# Patient Record
Sex: Male | Born: 1980 | Race: White | Hispanic: No | Marital: Single | Smoking: Current every day smoker
Health system: Southern US, Community
[De-identification: ages and names within clinical notes are randomized; demographics above are authoritative.]

## PROBLEM LIST (undated history)

## (undated) DIAGNOSIS — M5430 Sciatica, unspecified side: Secondary | ICD-10-CM

## (undated) DIAGNOSIS — S43006A Unspecified dislocation of unspecified shoulder joint, initial encounter: Secondary | ICD-10-CM

## (undated) DIAGNOSIS — F1910 Other psychoactive substance abuse, uncomplicated: Secondary | ICD-10-CM

## (undated) HISTORY — PX: ELBOW FRACTURE SURGERY: SHX616

---
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
## 2002-02-28 ENCOUNTER — Encounter: Payer: Self-pay | Admitting: Emergency Medicine

## 2002-02-28 ENCOUNTER — Emergency Department (HOSPITAL_COMMUNITY): Admission: EM | Admit: 2002-02-28 | Discharge: 2002-02-28 | Payer: Self-pay | Admitting: Emergency Medicine

## 2004-03-30 ENCOUNTER — Emergency Department (HOSPITAL_COMMUNITY): Admission: EM | Admit: 2004-03-30 | Discharge: 2004-03-30 | Payer: Self-pay | Admitting: *Deleted

## 2004-10-07 ENCOUNTER — Emergency Department (HOSPITAL_COMMUNITY): Admission: EM | Admit: 2004-10-07 | Discharge: 2004-10-07 | Payer: Self-pay | Admitting: Emergency Medicine

## 2006-04-26 ENCOUNTER — Emergency Department (HOSPITAL_COMMUNITY): Admission: EM | Admit: 2006-04-26 | Discharge: 2006-04-27 | Payer: Self-pay | Admitting: Emergency Medicine

## 2008-06-09 ENCOUNTER — Emergency Department (HOSPITAL_COMMUNITY): Admission: EM | Admit: 2008-06-09 | Discharge: 2008-06-09 | Payer: Self-pay | Admitting: Emergency Medicine

## 2008-12-21 ENCOUNTER — Emergency Department (HOSPITAL_COMMUNITY): Admission: EM | Admit: 2008-12-21 | Discharge: 2008-12-21 | Payer: Self-pay | Admitting: Emergency Medicine

## 2010-01-16 ENCOUNTER — Emergency Department (HOSPITAL_COMMUNITY): Admission: EM | Admit: 2010-01-16 | Discharge: 2010-01-16 | Payer: Self-pay | Admitting: Emergency Medicine

## 2011-01-31 LAB — BASIC METABOLIC PANEL
BUN: 4 mg/dL — ABNORMAL LOW (ref 6–23)
CO2: 27 mEq/L (ref 19–32)
Calcium: 9 mg/dL (ref 8.4–10.5)
Chloride: 104 mEq/L (ref 96–112)
Creatinine, Ser: 0.97 mg/dL (ref 0.4–1.5)
GFR calc Af Amer: >60 mL/min (ref >60)
GFR calc non Af Amer: >60 mL/min (ref >60)
Glucose, Bld: 107 mg/dL — ABNORMAL HIGH (ref 70–99)
Potassium: 3.7 mEq/L (ref 3.5–5.1)
Sodium: 137 mEq/L (ref 135–145)

## 2011-01-31 LAB — CBC
MCHC: 35.1 g/dL (ref 30.0–36.0)
Platelets: 270 10*3/uL (ref 150–400)
RBC: 4.64 MIL/uL (ref 4.22–5.81)
RDW: 13 % (ref 11.5–15.5)

## 2011-01-31 LAB — DIFFERENTIAL
Basophils Absolute: 0 10*3/uL (ref 0.0–0.1)
Basophils Relative: 0 % (ref 0–1)
Neutro Abs: 5.4 10*3/uL (ref 1.7–7.7)
Neutrophils Relative %: 66 % (ref 43–77)

## 2011-01-31 LAB — RAPID URINE DRUG SCREEN, HOSP PERFORMED
Barbiturates: NOT DETECTED
Cocaine: POSITIVE — AB
Opiates: NOT DETECTED

## 2011-01-31 LAB — ETHANOL: Alcohol, Ethyl (B): <5 mg/dL (ref 0–10)

## 2011-03-08 NOTE — H&P (Signed)
[facility]  Patient:    Michael [name], Michael [name]                        MRN: [id number] Adm. Date:  09/19/00 Attending:  Jasmine Pang, M.D.                   Psychiatric Admission Assessment  IDENTIFICATION:  A 30 year old Caucasian male from [facility] referred secondary to attempting to harm himself.  HISTORY OF PRESENT ILLNESS: The patient tied a speaker wire around his neck in an attempt to kill himself.  He states he has a history of feeling depressed and anxious. This has worsened recently with tearfulness and escalating anxiety episodes.  He also has difficulty sleeping (partly related to his oppositional defiant disorder job hours at [employer]).  He also has had some increase in appetite recently, though he also had been losing weight several months ago without trying to.  He also complains of difficulty concentrating, anergia and anhedonia.  He had a history of suicidal ideation in the past but has been able to control this until recently.  He states he became distressed after receiving negative feedback at [facility] by his boss about his job performance.  He quit his job and then began to feel anxious about how he was going to buy a car he was in the process of trying to purchase. At that point he went to his room and tied a speaker wire around his neck and states he wanted to die.  His brother was in the home and intervened.  PAST PSYCHIATRIC HISTORY:  No treatment.  SUBSTANCE ABUSE HISTORY:  No treatment.  PAST MEDICAL HISTORY:  History of back pain.  ALLERGIES:  No known drug allergies.  CURRENT MEDICATIONS:  He states he has recently been on some medication ? name, 25 mg for his tendinitis in his right knee and his back pain.  FAMILY AND SOCIAL HISTORY:  The patient lives with his mother.  He dropped out of school in the ninth grade at the age of 30 years old.  He worked at [employer] but recently quit, after having an  altercation with his Production designer, theatre/television/film.  He denies a history of physical or sexual abuse.   His family history is positive for his mother having a history of depression and anxiety, cousins had a history of depression.  There is some alcohol abuse in relatives.  LEGAL HISTORY:  None.  ADMISSION DIAGNOSES: Axis I:    Major depression, recurrent, sever. Axis II:   Deferred. Axis III:  History of back pain. Axis IV:   Moderate. Axis V:    Global assessment of functioning of 10.  PATIENT ASSETS AND STRENGTHS:  The patient is healthy, verbal and engaging.  PROBLEMS:  Mood instability with suicidal ideation.  Short term treatment goal:  Resolution of suicidal ideation.  Long term treatment goal:  Resolution of mood instability.  ESTIMATED LENGTH OF INPATIENT TREATMENT:  Three to five days.  CONDITION NECESSARY FOR DISCHARGE:  No longer suicidal.  INITIAL PLAN OF CARE: 1. Begin Effexor XR 37.5 mg x 2 days, then 75 mg in the morning, hydroxyzine    25 mg p.o. b.i.d. and 50 mg p.o. q.h.s. for anxiety and insomnia. 2. Therapies to decrease anxiety and diminish cognitive distortions and    reduce suicidal ideation. DD:  09/19/00 TD:  09/19/00 Job: [id number]

## 2011-03-08 NOTE — Discharge Summary (Signed)
[facility]  Patient:    Michael [name], [place]                      MRN: [id number] Adm. Date:  [id number] Disc. Date: 09/22/00 Attending:  Carolanne Grumbling D                           Discharge Summary  REASON FOR ADMISSION:  The patient is a 30 year old Caucasian male from [facility] referred after he was attempting to harm himself.  He had tied a speaker wire around his neck in an attempt "to kill myself."  He was found by his cousin.  He states he has become increasingly depressed with multiple neurovegetative symptoms and positive suicidal ideation in the past.  PHYSICAL EXAMINATION:  There were no acute medical problems.  ADMISSION LABORATORIES:  A hepatic profile was within normal limits.  Thyroid panel was within normal limits. RPR negative.  The CBC with differential was done at [facility] and was within normal limits grossly.  Metabolic panel done at [facility] was within normal limits. Urine drug screen was negative.  HOSPITAL COURSE:  The patient was started on Effexor 37.5 mg q.a.m. for one day, then Effexor XR 75 mg q.a.m.  He was also begun on Vistaril 25 mg p.o. b.i.d. and 50 mg q.h.s. for anxiety.  After his physical examination he was started on Prevacid 30 mg one p.o. q.d. by Beatris Si. Adams, P.A.C., due to GERD symptoms.  Then, on September 21, 2000, Vistaril was increased to 25 mg three times a day and 100 mg at bedtime due to continued anxiety and insomnia. These medications were effective in treating his anxiety.  He was educated that the depression will resolve in three to four weeks.  He had no side effects to the medications and tolerated them well.  He also participated appropriately in unit therapeutic groups and activities to address his depressive symptoms.  He and his mother met with our social worker to discuss options at home for helping him feel better.  MENTAL STATUS EXAMINATION:  At the time of  discharge the patients eye had improved.  Psychomotor activity was within normal limits and speech was normal rate and flow.  Mood was less anxious.  Some depression but improved.  Affect: Able to reveal wide range. There was no suicidal ideation, homicidal ideation, self-injurious behavior or aggression.  There was no psychosis or perceptual disturbance.  The thought processes were logical and goal directed. Thought content:  He was excised about going home.  On cognitive examination, the patient was alert and oriented x 4.  It was grossly within normal limits. Insight was good and judgement was good.  DISCHARGE DIAGNOSES: AXIS I.   Major depression, recurrent, severe. AXIS II.  None. AXIS III. 1. History of back pain.           2. Gastroesophageal reflux disease. AXIS IV.  Moderate. AXIS V.   Global Assessment of Functioning 60/10.  POST HOSPITAL CARE PLANS: 1. The patient will return home to live with his mother. 2. Diet:  No resections. 3. Activity level:  No restrictions.  FOLLOW-UP THERAPY AND MEDICATION MANAGEMENT: This will be in the [facility] [place]  An appointment will be made to see Pamala Duffel, R.N., for therapy.  He will be seen by Jasmine Pang, M.D., for medication management.  DISCHARGE MEDICATIONS: 1.  Effexor 112.5 mg in the morning. 2. Vistaril 50 mg b.i.d. and 100 mg at bedtime. 3. Prevacid 30 mg in the morning. DD:  09/22/00 TD:  09/22/00 Job: [id number]

## 2016-01-21 ENCOUNTER — Emergency Department (HOSPITAL_COMMUNITY): Payer: Self-pay

## 2016-01-21 ENCOUNTER — Emergency Department (HOSPITAL_COMMUNITY)
Admission: EM | Admit: 2016-01-21 | Discharge: 2016-01-21 | Disposition: A | Discharge status: Home / Self Care | Payer: Self-pay | Attending: Emergency Medicine | Admitting: Emergency Medicine

## 2016-01-21 ENCOUNTER — Encounter (HOSPITAL_COMMUNITY): Payer: Self-pay | Admitting: Emergency Medicine

## 2016-01-21 DIAGNOSIS — J069 Acute upper respiratory infection, unspecified: Secondary | ICD-10-CM | POA: Insufficient documentation

## 2016-01-21 DIAGNOSIS — Z79899 Other long term (current) drug therapy: Secondary | ICD-10-CM | POA: Insufficient documentation

## 2016-01-21 DIAGNOSIS — Z7982 Long term (current) use of aspirin: Secondary | ICD-10-CM | POA: Insufficient documentation

## 2016-01-21 DIAGNOSIS — F17210 Nicotine dependence, cigarettes, uncomplicated: Secondary | ICD-10-CM | POA: Insufficient documentation

## 2016-01-21 DIAGNOSIS — M5431 Sciatica, right side: Secondary | ICD-10-CM

## 2016-01-21 DIAGNOSIS — M5441 Lumbago with sciatica, right side: Secondary | ICD-10-CM | POA: Insufficient documentation

## 2016-01-21 LAB — URINALYSIS, ROUTINE W REFLEX MICROSCOPIC
Bilirubin Urine: NEGATIVE
Glucose, UA: NEGATIVE mg/dL
Hgb urine dipstick: NEGATIVE
Ketones, ur: NEGATIVE mg/dL
Leukocytes, UA: NEGATIVE
NITRITE: NEGATIVE
PROTEIN: NEGATIVE mg/dL
SPECIFIC GRAVITY, URINE: 1.015 (ref 1.005–1.030)
pH: 6 (ref 5.0–8.0)

## 2016-01-21 MED ORDER — HYDROCODONE-ACETAMINOPHEN 5-325 MG PO TABS: ORAL_TABLET | ORAL | Status: DC

## 2016-01-21 MED ORDER — CYCLOBENZAPRINE HCL 10 MG PO TABS
10.0000 mg | ORAL_TABLET | Freq: Once | ORAL | Status: AC
  Administered 2016-01-21: 10 mg via ORAL
  Filled 2016-01-21: qty 1

## 2016-01-21 MED ORDER — CYCLOBENZAPRINE HCL 10 MG PO TABS: 10.0000 mg | ORAL_TABLET | Freq: Three times a day (TID) | ORAL | Status: DC | PRN

## 2016-01-21 MED ORDER — IBUPROFEN 800 MG PO TABS: 800.0000 mg | ORAL_TABLET | Freq: Three times a day (TID) | ORAL | Status: DC

## 2016-01-21 MED ORDER — HYDROCODONE-ACETAMINOPHEN 5-325 MG PO TABS
1.0000 | ORAL_TABLET | Freq: Once | ORAL | Status: AC
  Administered 2016-01-21: 1 via ORAL
  Filled 2016-01-21: qty 1

## 2016-01-21 NOTE — ED Notes (Signed)
Pt complains of right lower back pain radiating down leg x 3 weeks. Pt also complains of cough. No SOB.

## 2016-01-21 NOTE — Discharge Instructions (Signed)
°  Sciatica °Sciatica is pain, weakness, numbness, or tingling along your sciatic nerve. The nerve starts in the lower back and runs down the back of each leg. Nerve damage or certain conditions pinch or put pressure on the sciatic nerve. This causes the pain, weakness, and other discomforts of sciatica. °HOME CARE  °· Only take medicine as told by your doctor. °· Apply ice to the affected area for 20 minutes. Do this 3-4 times a day for the first 48-72 hours. Then try heat in the same way. °· Exercise, stretch, or do your usual activities if these do not make your pain worse. °· Go to physical therapy as told by your doctor. °· Keep all doctor visits as told. °· Do not wear high heels or shoes that are not supportive. °· Get a firm mattress if your mattress is too soft to lessen pain and discomfort. °GET HELP RIGHT AWAY IF:  °· You cannot control when you poop (bowel movement) or pee (urinate). °· You have more weakness in your lower back, lower belly (pelvis), butt (buttocks), or legs. °· You have redness or puffiness (swelling) of your back. °· You have a burning feeling when you pee. °· You have pain that gets worse when you lie down. °· You have pain that wakes you from your sleep. °· Your pain is worse than past pain. °· Your pain lasts longer than 4 weeks. °· You are suddenly losing weight without reason. °MAKE SURE YOU:  °· Understand these instructions. °· Will watch this condition. °· Will get help right away if you are not doing well or get worse. °  °This information is not intended to replace advice given to you by your health care provider. Make sure you discuss any questions you have with your health care provider. °  °Document Released: 07/16/2008 Document Revised: 06/28/2015 Document Reviewed: 02/16/2012 °Elsevier Interactive Patient Education ©2016 Elsevier Inc. ° ° °

## 2016-01-22 NOTE — ED Provider Notes (Signed)
CSN: [id number]     Arrival date & time 01/21/16  1017 History   First MD Initiated Contact with Patient 01/21/16 1037     Chief Complaint  Patient presents with  . Back Pain     (Consider location/radiation/quality/duration/timing/severity/associated sxs/prior Treatment) HPI   Michael [name] is a 35 y.o. male who presents to the Emergency Department complaining of right sided low back pain for 3 weeks.  He describes a sharp stabbing pain in his right low back that radiates down the right leg to level of his knee.  Pain is worse with weight bearing and bending.  He denies numbness or weakness of the extremities, fever, chills, abd pain, urine or bowel changes.  He also complains of cough and nasal congestion for several days.  Cough is non-productive.  Denies shortness of breath, chest pain and sore throat.     History reviewed. No pertinent past medical history. History reviewed. No pertinent past surgical history. History reviewed. No pertinent family history. Social History  Substance Use Topics  . Smoking status: Current Every Day Smoker -- 0.50 packs/day    Types: Cigarettes  . Smokeless tobacco: None  . Alcohol Use: No    Review of Systems  Constitutional: Negative for fever, activity change and appetite change.  HENT: Positive for congestion. Negative for sore throat.   Respiratory: Positive for cough. Negative for shortness of breath.   Cardiovascular: Negative for chest pain.  Gastrointestinal: Negative for vomiting, abdominal pain and constipation.  Genitourinary: Negative for dysuria, hematuria, flank pain, decreased urine volume and difficulty urinating.  Musculoskeletal: Positive for back pain. Negative for joint swelling.  Skin: Negative for rash.  Neurological: Negative for dizziness, weakness and numbness.  All other systems reviewed and are negative.     Allergies  Review of patient's allergies indicates no known allergies.  Home Medications   Prior to  Admission medications   Medication Sig Start Date End Date Taking? Authorizing Provider  Aspirin-Caffeine (BAYER BACK & BODY) 500-32.5 MG TABS Take 1 tablet by mouth daily.   Yes Historical Provider, MD  Pseudoephedrine-Ibuprofen (IBUPROFEN AND PSE COLD & SINUS) 30-200 MG TABS Take 2 tablets by mouth daily.   Yes Historical Provider, MD  cyclobenzaprine (FLEXERIL) 10 MG tablet Take 1 tablet (10 mg total) by mouth 3 (three) times daily as needed. 01/21/16   Waldo Damian, PA-C  HYDROcodone-acetaminophen (NORCO/VICODIN) 5-325 MG tablet Take one-two tabs po q 4-6 hrs prn pain 01/21/16   Jefrey Raburn, PA-C  ibuprofen (ADVIL,MOTRIN) 800 MG tablet Take 1 tablet (800 mg total) by mouth 3 (three) times daily. 01/21/16   Zaley Talley, PA-C   BP 136/87 mmHg  Pulse 78  Temp(Src) 98.3 F (36.8 C) (Oral)  Resp 16  Ht  (1.676 m)  Wt 95.255 kg  BMI 33.91 kg/m2  SpO2 100% Physical Exam  Constitutional: He is oriented to person, place, and time. He appears well-developed and well-nourished. No distress.  HENT:  Head: Normocephalic and atraumatic.  Mouth/Throat: Oropharynx is clear and moist.  Neck: Normal range of motion. Neck supple.  Cardiovascular: Normal rate, regular rhythm and intact distal pulses.   No murmur heard. Pulmonary/Chest: Effort normal and breath sounds normal. No respiratory distress. He exhibits no tenderness.  Abdominal: Soft. He exhibits no distension. There is no tenderness.  Musculoskeletal: He exhibits tenderness. He exhibits no edema.       Lumbar back: He exhibits tenderness and pain. He exhibits normal range of motion, no swelling, no deformity, no  laceration and normal pulse.  ttp of the right lumbar paraspinal muscles and SI joint.  No spinal tenderness.  DP pulses are brisk and symmetrical.  Distal sensation intact.  Pt has 5/5 strength against resistance of bilateral lower extremities.     Neurological: He is alert and oriented to person, place, and time. He has  normal strength. No sensory deficit. He exhibits normal muscle tone. Coordination and gait normal.  Reflex Scores:      Patellar reflexes are 2+ on the right side and 2+ on the left side.      Achilles reflexes are 2+ on the right side and 2+ on the left side. Skin: Skin is warm and dry. No rash noted.  Nursing note and vitals reviewed.   ED Course  Procedures (including critical care time) Labs Review Labs Reviewed  URINALYSIS, ROUTINE W REFLEX MICROSCOPIC ([facility])    Imaging Review Dg Lumbar Spine Complete  01/21/2016  CLINICAL DATA:  Right lower back and lower extremity pain for 3 weeks without known injury. EXAM: LUMBAR SPINE - COMPLETE 4+ VIEW COMPARISON:  None. FINDINGS: There is no evidence of lumbar spine fracture. Alignment is normal. Intervertebral disc spaces are maintained. IMPRESSION: Normal lumbar spine. Electronically Signed   By: Lupita RaiderJames  Green Jr, M.D.   On: 01/21/2016 11:30   I have personally reviewed and evaluated these images and lab results as part of my medical decision-making.   EKG Interpretation None      MDM   Final diagnoses:  Sciatica of right side  URI (upper respiratory infection)    Pt well appearing. Non-toxic.  No focal neuro deficits.  No concerning sx's for emergent neurological process.  Likely sciatica.  Agrees to close PMD.  Appears stable for d/c.  Ambulates with steady gait.      Pauline Ausammy Kalep Full, PA-C 01/22/16 2217  Benjiman CoreNathan Pickering, MD 01/23/16 [phone]

## 2016-05-24 ENCOUNTER — Encounter (HOSPITAL_COMMUNITY): Payer: Self-pay | Admitting: *Deleted

## 2016-05-24 ENCOUNTER — Emergency Department (HOSPITAL_COMMUNITY)
Admission: EM | Admit: 2016-05-24 | Discharge: 2016-05-25 | Disposition: A | Discharge status: Home / Self Care | Payer: Self-pay | Attending: Emergency Medicine | Admitting: Emergency Medicine

## 2016-05-24 DIAGNOSIS — M5441 Lumbago with sciatica, right side: Secondary | ICD-10-CM | POA: Insufficient documentation

## 2016-05-24 DIAGNOSIS — F17210 Nicotine dependence, cigarettes, uncomplicated: Secondary | ICD-10-CM | POA: Insufficient documentation

## 2016-05-24 DIAGNOSIS — Z7982 Long term (current) use of aspirin: Secondary | ICD-10-CM | POA: Insufficient documentation

## 2016-05-24 HISTORY — DX: Sciatica, unspecified side: M54.30

## 2016-05-24 MED ORDER — DEXAMETHASONE SODIUM PHOSPHATE 10 MG/ML IJ SOLN
10.0000 mg | Freq: Once | INTRAMUSCULAR | Status: AC
  Administered 2016-05-24: 10 mg via INTRAMUSCULAR
  Filled 2016-05-24: qty 1

## 2016-05-24 MED ORDER — KETOROLAC TROMETHAMINE 60 MG/2ML IM SOLN
60.0000 mg | Freq: Once | INTRAMUSCULAR | Status: AC
  Administered 2016-05-24: 60 mg via INTRAMUSCULAR
  Filled 2016-05-24: qty 2

## 2016-05-24 NOTE — ED Provider Notes (Signed)
[facility]-EMERGENCY DEPT Provider Note   CSN: [id number] Arrival date & time: 05/24/16  2037  First Provider Contact:  First MD Initiated Contact with Patient 05/24/16 2228      By signing my name below, I, Emmanuella Mensah, attest that this documentation has been prepared under the direction and in the presence of Cheri Fowler, PA-C. Electronically Signed: Angelene Giovanni, ED Scribe. 05/24/16. 10:52 PM.    History   Chief Complaint Chief Complaint  Patient presents with  . Back Pain    HPI Comments: Michael [name] is a 35 y.o. male with a hx of sciatica who presents to the Emergency Department complaining of ongoing 6/10 right lower back pain that radiates down his posterior right leg onset 01/2016. No alleviating factors noted. He states that he went to [facility] where he was diagnosed with sciatica and given NSAIDs after onset of the pain but he reports no relief with the NSAIDs. He denies any recent falls, injuries, or trauma to the back. He denies any IV drug use. He also denies any fever, chills, numbness, weakness, bowel/bladder incontinence, or urinary symptoms.   The history is provided by the patient. No language interpreter was used.    Past Medical History:  Diagnosis Date  . Sciatica     There are no active problems to display for this patient.   History reviewed. No pertinent surgical history.     Home Medications    Prior to Admission medications   Medication Sig Start Date End Date Taking? Authorizing Provider  Aspirin-Caffeine (BAYER BACK & BODY) 500-32.5 MG TABS Take 1 tablet by mouth daily.    Historical Provider, MD  cyclobenzaprine (FLEXERIL) 10 MG tablet Take 1 tablet (10 mg total) by mouth 3 (three) times daily as needed. 01/21/16   Tammy Triplett, PA-C  HYDROcodone-acetaminophen (NORCO/VICODIN) 5-325 MG tablet Take one-two tabs po q 4-6 hrs prn pain 01/21/16   Tammy Triplett, PA-C  ibuprofen (ADVIL,MOTRIN) 800 MG tablet Take 1 tablet (800 mg total) by  mouth 3 (three) times daily. 01/21/16   Tammy Triplett, PA-C  methylPREDNISolone (MEDROL DOSEPAK) 4 MG TBPK tablet Take as instructed on package. 05/25/16   Cheri Fowler, PA-C  naproxen (NAPROSYN) 500 MG tablet Take 1 tablet (500 mg total) by mouth 2 (two) times daily. 05/25/16   Cheri Fowler, PA-C  oxyCODONE-acetaminophen (PERCOCET/ROXICET) 5-325 MG tablet Take 1 tablet by mouth every 6 (six) hours as needed for severe pain. 05/25/16   Cheri Fowler, PA-C  Pseudoephedrine-Ibuprofen (IBUPROFEN AND PSE COLD & SINUS) 30-200 MG TABS Take 2 tablets by mouth daily.    Historical Provider, MD    Family History History reviewed. No pertinent family history.  Social History Social History  Substance Use Topics  . Smoking status: Current Every Day Smoker    Packs/day: 0.50    Types: Cigarettes  . Smokeless tobacco: Never Used  . Alcohol use No     Allergies   Review of patient's allergies indicates no known allergies.   Review of Systems Review of Systems  Constitutional: Negative for chills and fever.  Genitourinary: Negative for dysuria and hematuria.  Musculoskeletal: Positive for back pain.  Neurological: Negative for weakness and numbness.     Physical Exam Updated Vital Signs BP 134/92 (BP Location: Left Arm)   Pulse 72   Temp 98.3 F (36.8 C) (Oral)   Resp 20   Ht 5\' 6"  (1.676 m)   Wt 87.5 kg   SpO2 98%   BMI 31.15 kg/m  Physical Exam  Constitutional: He is oriented to person, place, and time. He appears well-developed and well-nourished.  HENT:  Head: Normocephalic and atraumatic.  Right Ear: External ear normal.  Left Ear: External ear normal.  Eyes: Conjunctivae are normal. No scleral icterus.  Neck: No tracheal deviation present.  Cardiovascular: Normal rate, regular rhythm, normal heart sounds and intact distal pulses.   Pulses:      Dorsalis pedis pulses are 2+ on the right side, and 2+ on the left side.  Pulmonary/Chest: Effort normal and breath sounds normal. No  respiratory distress.  Abdominal: Soft. Bowel sounds are normal. He exhibits no distension. There is no tenderness.  Musculoskeletal: Normal range of motion. He exhibits no tenderness.  No spinous process tenderness.  No step offs. No crepitus.  Neurological: He is alert and oriented to person, place, and time.  No saddle anesthesia. Strength and sensation intact bilaterally throughout lower extremities. Normal gait. No foot drop.  Skin: Skin is warm and dry.  Psychiatric: He has a normal mood and affect. His behavior is normal.     ED Treatments / Results  DIAGNOSTIC STUDIES: Oxygen Saturation is 98% on RA, normal by my interpretation.    COORDINATION OF CARE: 10:51 PM- Pt advised of plan for treatment and pt agrees. Will provide resources for [facility] for PCP establishment for possible MRI. He will receive prednisone, NSAIDs, and pain medication.   Procedures Procedures (including critical care time)  Medications Ordered in ED Medications  dexamethasone (DECADRON) injection 10 mg (10 mg Intramuscular Given 05/24/16 2311)  ketorolac (TORADOL) injection 60 mg (60 mg Intramuscular Given 05/24/16 2311)     Initial Impression / Assessment and Plan / ED Course  Cheri Fowler, PA-C has reviewed the triage vital signs and the nursing notes.  Pertinent labs & imaging results that were available during my care of the patient were reviewed by me and considered in my medical decision making (see chart for details).  Clinical Course   Patient presents with low back pain.  VSS.  No injury/trauma.  No red flags.  No neurological deficits.  Distal pulses intact.  No gait abnormalities.  Suspect low back strain or other mechanical cause.  Doubt cauda equina.  Doubt infectious process.  Doubt AAA.  Discussed return precautions to the ED.  Follow up with PCP.   Final Clinical Impressions(s) / ED Diagnoses   Final diagnoses:  Right-sided low back pain with right-sided sciatica   I  personally performed the services described in this documentation, which was scribed in my presence. The recorded information has been reviewed and is accurate.   New Prescriptions New Prescriptions   METHYLPREDNISOLONE (MEDROL DOSEPAK) 4 MG TBPK TABLET    Take as instructed on package.   NAPROXEN (NAPROSYN) 500 MG TABLET    Take 1 tablet (500 mg total) by mouth 2 (two) times daily.   OXYCODONE-ACETAMINOPHEN (PERCOCET/ROXICET) 5-325 MG TABLET    Take 1 tablet by mouth every 6 (six) hours as needed for severe pain.     Cheri Fowler, PA-C 05/25/16 0015    Mancel Bale, MD 05/25/16 [phone]

## 2016-05-24 NOTE — ED Triage Notes (Signed)
Pt c/o right lower back pain shooting down right leg. Pt states he was dx with sciatica at [facility] about a month ago, given anti inflammatories but had zero relief from medication.

## 2016-05-25 MED ORDER — OXYCODONE-ACETAMINOPHEN 5-325 MG PO TABS: 1.0000 | ORAL_TABLET | Freq: Four times a day (QID) | ORAL | 0 refills | Status: DC | PRN

## 2016-05-25 MED ORDER — NAPROXEN 500 MG PO TABS: 500.0000 mg | ORAL_TABLET | Freq: Two times a day (BID) | ORAL | 0 refills | Status: DC

## 2016-05-25 MED ORDER — METHYLPREDNISOLONE 4 MG PO TBPK: ORAL_TABLET | ORAL | 0 refills | Status: DC

## 2016-05-25 NOTE — ED Notes (Signed)
Patient able to ambulate independently  

## 2016-05-31 ENCOUNTER — Encounter: Payer: Self-pay | Admitting: Physician Assistant

## 2016-05-31 ENCOUNTER — Other Ambulatory Visit: Payer: Self-pay | Admitting: Physician Assistant

## 2016-05-31 ENCOUNTER — Ambulatory Visit: Payer: Self-pay | Attending: Internal Medicine | Admitting: Physician Assistant

## 2016-05-31 VITALS — BP 121/88 | HR 95 | Temp 98.3°F | Resp 16 | Wt 191.6 lb

## 2016-05-31 DIAGNOSIS — Z131 Encounter for screening for diabetes mellitus: Secondary | ICD-10-CM

## 2016-05-31 DIAGNOSIS — M5416 Radiculopathy, lumbar region: Secondary | ICD-10-CM

## 2016-05-31 DIAGNOSIS — IMO0001 Reserved for inherently not codable concepts without codable children: Secondary | ICD-10-CM

## 2016-05-31 LAB — GLUCOSE, POCT (MANUAL RESULT ENTRY): POC Glucose: 93 mg/dl (ref 70–99)

## 2016-05-31 MED ORDER — CYCLOBENZAPRINE HCL 10 MG PO TABS: 10.0000 mg | ORAL_TABLET | Freq: Three times a day (TID) | ORAL | 0 refills | Status: DC | PRN

## 2016-05-31 MED ORDER — PREDNISONE 20 MG PO TABS: ORAL_TABLET | ORAL | 0 refills | Status: DC

## 2016-05-31 MED ORDER — ACETAMINOPHEN-CODEINE #3 300-30 MG PO TABS: 1.0000 | ORAL_TABLET | ORAL | 0 refills | Status: DC | PRN

## 2016-05-31 NOTE — Progress Notes (Signed)
Michael [name], is a 35 y.o. male  ZOX:[id number]  [id number]:[id number]  DOB - 02-Mar-1981  Subjective:  Chief Complaint and HPI: Michael [name] is a 35 y.o. male here today to establish care and for a follow up visit after being seen in the ED in April and again in August for R sided LBP with R leg pain. NKI.  No precipitating event.  He jsut started having pain in his R lower back in April and it has progressively worsened.  He was given prednisone and hydrocodone in April and Prednisone and Oxycodone in August. He says the pain med provides temporarily relief and the first few days of steroid seems to help.  He denies numbness, weakness, and paresthesias.  He admits to pain all the way down his R leg.  PMH unremarkable.   ED/Hospital notes reviewed.    ROS:   Constitutional:  No f/c, No night sweats, No unexplained weight loss. EENT:  No vision changes, No blurry vision, No hearing changes. No mouth, throat, or ear problems.  Respiratory: No cough, No SOB Cardiac: No CP, no palpitations GI:  No abd pain, No N/V/D. GU: No Urinary s/sx Musculoskeletal: +back and R leg pain Neuro: No headache, no dizziness, no motor weakness.  Skin: No rash Endocrine:  No polydipsia. No polyuria.  Psych: Denies SI/HI  No problems updated.  ALLERGIES: No Known Allergies  PAST MEDICAL HISTORY: Past Medical History:  Diagnosis Date  . Sciatica     MEDICATIONS AT HOME: Prior to Admission medications   Medication Sig Start Date End Date Taking? Authorizing Provider  acetaminophen-codeine (TYLENOL #3) 300-30 MG tablet Take 1 tablet by mouth every 4 (four) hours as needed for moderate pain. 05/31/16   Anders Simmonds, PA-C  Aspirin-Caffeine (BAYER BACK & BODY) 500-32.5 MG TABS Take 1 tablet by mouth daily.    Historical Provider, MD  cyclobenzaprine (FLEXERIL) 10 MG tablet Take 1 tablet (10 mg total) by mouth 3 (three) times daily as needed. 05/31/16   Anders Simmonds, PA-C  ibuprofen (ADVIL,MOTRIN) 800  MG tablet Take 1 tablet (800 mg total) by mouth 3 (three) times daily. 01/21/16   Tammy Triplett, PA-C  naproxen (NAPROSYN) 500 MG tablet Take 1 tablet (500 mg total) by mouth 2 (two) times daily. 05/25/16   Cheri Fowler, PA-C  predniSONE (DELTASONE) 20 MG tablet 3,3,3,2,2,2,1,1,1 Take each days dose in am with food 05/31/16   Anders Simmonds, PA-C  Pseudoephedrine-Ibuprofen (IBUPROFEN AND PSE COLD & SINUS) 30-200 MG TABS Take 2 tablets by mouth daily.    Historical Provider, MD     Objective:  EXAM:   Vitals:   05/31/16 1500  BP: 121/88  Pulse: 95  Resp: 16  Temp: 98.3 F (36.8 C)  TempSrc: Oral  SpO2: 97%  Weight: 191 lb 9.6 oz (86.9 kg)    General appearance : A&OX3. NAD. Non-toxic-appearing HEENT: Atraumatic and Normocephalic.  PERRLA. EOM intact.  TM clear B. Mouth-MMM, post pharynx WNL w/o erythema, No PND. Neck: supple, no JVD. No cervical lymphadenopathy. No thyromegaly Chest/Lungs:  Breathing-non-labored, Good air entry bilaterally, breath sounds normal without rales, rhonchi, or wheezing  CVS: S1 S2 regular, no murmurs, gallops, rubs  Abdomen: Bowel sounds present, Non tender and not distended with no gaurding, rigidity or rebound. Extremities: Bilateral Lower Ext shows no edema, both legs are warm to touch with = pulse throughout.  +symmetry throughout.  S&ROM =B.  DTR=B.  +SLR R&L.   Back:  No TTP over thoracic or  lumbar processes.  TTP in R S-I joint.  General TTP of surrounding musculature.  Neurology:  CN II-XII grossly intact, Non focal.   Psych:  TP linear. J/I WNL. Normal speech. Appropriate eye contact and affect.  Skin:  No Rash  Data Review No results found for: HGBA1C   Assessment & Plan   1. Radicular pain of right lower back - cyclobenzaprine (FLEXERIL) 10 MG tablet; Take 1 tablet (10 mg total) by mouth 3 (three) times daily as needed.  Dispense: 21 tablet; Refill: 0 - MR Lumbar Spine Wo Contrast; Future - acetaminophen-codeine (TYLENOL #3) 300-30 MG  tablet; Take 1 tablet by mouth every 4 (four) hours as needed for moderate pain.  Dispense: 20 tablet; Refill: 0 - predniSONE (DELTASONE) 20 MG tablet; 3,3,3,2,2,2,1,1,1 Take each days dose in am with food  Dispense: 18 tablet; Refill: 0 [place] database drug query performed and only showed the hydrocodone and oxycodone prescribed in April and August respectively.  I did discuss risks of chemical dependency/dangers of opiate use in general and how it should be limited or avoided when at all possible.   2. Screening for diabetes mellitus Check sugar due to recent steroids - Glucose (CBG)   Patient have been counseled extensively about nutrition and exercise  Return in about 3 weeks (around 06/21/2016) for establish with a PCP here and f/up back pain.  The patient was given clear instructions to go to ER or return to medical center if symptoms don't improve, worsen or new problems develop. The patient verbalized understanding. The patient was told to call to get lab results if they haven't heard anything in the next week.     Georgian CoAngela Tonga Prout, PA-C [facility] [place], [place] [phone]   05/31/2016, 3:43 PM

## 2016-05-31 NOTE — Patient Instructions (Addendum)
4:45 pm Thursday, August 17th at [facility] for MRI of the lower back Arrive 10-15 mins early        Back Pain, Adult Back pain is very common in adults.The cause of back pain is rarely dangerous and the pain often gets better over time.The cause of your back pain may not be known. Some common causes of back pain include:  Strain of the muscles or ligaments supporting the spine.  Wear and tear (degeneration) of the spinal disks.  Arthritis.  Direct injury to the back. For many people, back pain may return. Since back pain is rarely dangerous, most people can learn to manage this condition on their own. HOME CARE INSTRUCTIONS Watch your back pain for any changes. The following actions may help to lessen any discomfort you are feeling:  Remain active. It is stressful on your back to sit or stand in one place for long periods of time. Do not sit, drive, or stand in one place for more than 30 minutes at a time. Take short walks on even surfaces as soon as you are able.Try to increase the length of time you walk each day.  Exercise regularly as directed by your health care provider. Exercise helps your back heal faster. It also helps avoid future injury by keeping your muscles strong and flexible.  Do not stay in bed.Resting more than 1-2 days can delay your recovery.  Pay attention to your body when you bend and lift. The most comfortable positions are those that put less stress on your recovering back. Always use proper lifting techniques, including:  Bending your knees.  Keeping the load close to your body.  Avoiding twisting.  Find a comfortable position to sleep. Use a firm mattress and lie on your side with your knees slightly bent. If you lie on your back, put a pillow under your knees.  Avoid feeling anxious or stressed.Stress increases muscle tension and can worsen back pain.It is important to recognize when you are anxious or stressed and learn ways to  manage it, such as with exercise.  Take medicines only as directed by your health care provider. Over-the-counter medicines to reduce pain and inflammation are often the most helpful.Your health care provider may prescribe muscle relaxant drugs.These medicines help dull your pain so you can more quickly return to your normal activities and healthy exercise.  Apply ice to the injured area:  Put ice in a plastic bag.  Place a towel between your skin and the bag.  Leave the ice on for 20 minutes, 2-3 times a day for the first 2-3 days. After that, ice and heat may be alternated to reduce pain and spasms.  Maintain a healthy weight. Excess weight puts extra stress on your back and makes it difficult to maintain good posture. SEEK MEDICAL CARE IF:  You have pain that is not relieved with rest or medicine.  You have increasing pain going down into the legs or buttocks.  You have pain that does not improve in one week.  You have night pain.  You lose weight.  You have a fever or chills. SEEK IMMEDIATE MEDICAL CARE IF:   You develop new bowel or bladder control problems.  You have unusual weakness or numbness in your arms or legs.  You develop nausea or vomiting.  You develop abdominal pain.  You feel faint.   This information is not intended to replace advice given to you by your health care provider. Make sure you discuss  any questions you have with your health care provider.   Document Released: 10/07/2005 Document Revised: 10/28/2014 Document Reviewed: 02/08/2014 Elsevier Interactive Patient Education [employer].

## 2016-06-06 ENCOUNTER — Ambulatory Visit (HOSPITAL_COMMUNITY)
Admission: RE | Admit: 2016-06-06 | Discharge: 2016-06-06 | Disposition: A | Discharge status: Home / Self Care | Payer: Self-pay | Source: Ambulatory Visit | Attending: Physician Assistant | Admitting: Physician Assistant

## 2016-06-06 DIAGNOSIS — IMO0001 Reserved for inherently not codable concepts without codable children: Secondary | ICD-10-CM

## 2016-06-06 DIAGNOSIS — M5126 Other intervertebral disc displacement, lumbar region: Secondary | ICD-10-CM | POA: Insufficient documentation

## 2016-06-06 DIAGNOSIS — M5416 Radiculopathy, lumbar region: Secondary | ICD-10-CM | POA: Insufficient documentation

## 2016-06-06 DIAGNOSIS — M4806 Spinal stenosis, lumbar region: Secondary | ICD-10-CM | POA: Insufficient documentation

## 2016-06-10 ENCOUNTER — Emergency Department (HOSPITAL_COMMUNITY)
Admission: EM | Admit: 2016-06-10 | Discharge: 2016-06-10 | Disposition: A | Discharge status: Home / Self Care | Payer: Self-pay | Attending: Emergency Medicine | Admitting: Emergency Medicine

## 2016-06-10 ENCOUNTER — Other Ambulatory Visit: Payer: Self-pay | Admitting: Internal Medicine

## 2016-06-10 ENCOUNTER — Encounter (HOSPITAL_COMMUNITY): Payer: Self-pay | Admitting: Emergency Medicine

## 2016-06-10 DIAGNOSIS — M5441 Lumbago with sciatica, right side: Secondary | ICD-10-CM | POA: Insufficient documentation

## 2016-06-10 DIAGNOSIS — Z7982 Long term (current) use of aspirin: Secondary | ICD-10-CM | POA: Insufficient documentation

## 2016-06-10 DIAGNOSIS — Z791 Long term (current) use of non-steroidal anti-inflammatories (NSAID): Secondary | ICD-10-CM | POA: Insufficient documentation

## 2016-06-10 DIAGNOSIS — Z79899 Other long term (current) drug therapy: Secondary | ICD-10-CM | POA: Insufficient documentation

## 2016-06-10 DIAGNOSIS — M5431 Sciatica, right side: Secondary | ICD-10-CM

## 2016-06-10 DIAGNOSIS — F17210 Nicotine dependence, cigarettes, uncomplicated: Secondary | ICD-10-CM | POA: Insufficient documentation

## 2016-06-10 MED ORDER — KETOROLAC TROMETHAMINE 10 MG PO TABS
10.0000 mg | ORAL_TABLET | Freq: Once | ORAL | Status: AC
  Administered 2016-06-10: 10 mg via ORAL
  Filled 2016-06-10: qty 1

## 2016-06-10 MED ORDER — DIAZEPAM 5 MG PO TABS
10.0000 mg | ORAL_TABLET | Freq: Once | ORAL | Status: AC
  Administered 2016-06-10: 10 mg via ORAL
  Filled 2016-06-10: qty 2

## 2016-06-10 MED ORDER — DICLOFENAC SODIUM 75 MG PO TBEC: 75.0000 mg | DELAYED_RELEASE_TABLET | Freq: Two times a day (BID) | ORAL | 0 refills | Status: DC

## 2016-06-10 MED ORDER — DEXAMETHASONE SODIUM PHOSPHATE 4 MG/ML IJ SOLN
8.0000 mg | Freq: Once | INTRAMUSCULAR | Status: AC
Start: 2016-06-10 — End: 2016-06-10
  Administered 2016-06-10: 8 mg via INTRAMUSCULAR
  Filled 2016-06-10: qty 2

## 2016-06-10 MED ORDER — CYCLOBENZAPRINE HCL 10 MG PO TABS: 10.0000 mg | ORAL_TABLET | Freq: Three times a day (TID) | ORAL | 0 refills | Status: DC

## 2016-06-10 MED ORDER — DEXAMETHASONE 4 MG PO TABS: 4.0000 mg | ORAL_TABLET | Freq: Two times a day (BID) | ORAL | 0 refills | Status: DC

## 2016-06-10 NOTE — ED Provider Notes (Signed)
[facility]-EMER[facility]Y DEPT Provider Note   CSN: [id number] Arrival date & time: 06/10/16  1603     History   Chief Complaint Chief Complaint  Patient presents with  . Back Pain    HPI Michael [name] is a 35 y.o. male.  The history is provided by the patient.  Back Pain   This is a chronic problem. The current episode started more than 1 week ago. The problem occurs daily. The pain is associated with no known injury. The pain is present in the lumbar spine. The quality of the pain is described as shooting. The symptoms are aggravated by certain positions. The pain is the same all the time. Pertinent negatives include no chest pain, no fever, no numbness, no abdominal pain, no bladder incontinence, no paresthesias, no tingling and no weakness. He has tried NSAIDs for the symptoms.    Past Medical History:  Diagnosis Date  . Sciatica     There are no active problems to display for this patient.   History reviewed. No pertinent surgical history.     Home Medications    Prior to Admission medications   Medication Sig Start Date End Date Taking? Authorizing Provider  acetaminophen-codeine (TYLENOL #3) 300-30 MG tablet Take 1 tablet by mouth every 4 (four) hours as needed for moderate pain. 05/31/16   Anders SimmondsAngela M McClung, PA-C  Aspirin-Caffeine (BAYER BACK & BODY) 500-32.5 MG TABS Take 1 tablet by mouth daily.    Historical Provider, MD  cyclobenzaprine (FLEXERIL) 10 MG tablet Take 1 tablet (10 mg total) by mouth 3 (three) times daily as needed. 05/31/16   Anders SimmondsAngela M McClung, PA-C  ibuprofen (ADVIL,MOTRIN) 800 MG tablet Take 1 tablet (800 mg total) by mouth 3 (three) times daily. 01/21/16   Tammy Triplett, PA-C  naproxen (NAPROSYN) 500 MG tablet Take 1 tablet (500 mg total) by mouth 2 (two) times daily. 05/25/16   Cheri FowlerKayla Rose, PA-C  predniSONE (DELTASONE) 20 MG tablet 3,3,3,2,2,2,1,1,1 Take each days dose in am with food 05/31/16   Anders SimmondsAngela M McClung, PA-C  Pseudoephedrine-Ibuprofen (IBUPROFEN AND  PSE COLD & SINUS) 30-200 MG TABS Take 2 tablets by mouth daily.    Historical Provider, MD    Family History History reviewed. No pertinent family history.  Social History Social History  Substance Use Topics  . Smoking status: Current Every Day Smoker    Packs/day: 0.50    Types: Cigarettes  . Smokeless tobacco: Never Used  . Alcohol use Yes     Comment: "rarely"     Allergies   Review of patient's allergies indicates no known allergies.   Review of Systems Review of Systems  Constitutional: Negative for fever.  Cardiovascular: Negative for chest pain.  Gastrointestinal: Negative for abdominal pain.  Genitourinary: Negative for bladder incontinence.  Musculoskeletal: Positive for back pain.  Neurological: Negative for tingling, weakness, numbness and paresthesias.     Physical Exam Updated Vital Signs BP 120/82 (BP Location: Left Arm)   Pulse 92   Temp 98.8 F (37.1 C) (Oral)   Resp 18   Ht 5\' 6"  (1.676 m)   Wt 87.5 kg   SpO2 99%   BMI 31.15 kg/m   Physical Exam  Musculoskeletal:       Lumbar back: He exhibits pain and spasm.     ED Treatments / Results  Labs (all labs ordered are listed, but only abnormal results are displayed) Labs Reviewed - No data to display  EKG  EKG Interpretation None  Radiology No results found.  Procedures Procedures (including critical care time)  Medications Ordered in ED Medications - No data to display   Initial Impression / Assessment and Plan / ED Course  I have reviewed the triage vital signs and the nursing notes.  Pertinent labs & imaging results that were available during my care of the patient were reviewed by me and considered in my medical decision making (see chart for details).  Clinical Course    **I have reviewed nursing notes, vital signs, and all appropriate lab and imaging results for this patient.*  Final Clinical Impressions(s) / ED Diagnoses  The examination is consistent with  sciatica on the right. There no gross neurologic deficits appreciated at this time. There is no evidence for caudal equina or any other emergent situation.  I have ordered the patient Decadron, Flexeril, and diclofenac. Review of the medical record reveals the patient has requested medication and refill of medications has been on suggested to the patient's primary physician, but in talking with [facility], the prescriptions have not been filled. The patient will still be given the prescriptions listed above, with injections to see his primary physician for pain management.    Final diagnoses:  Sciatica of right side    New Prescriptions New Prescriptions   No medications on file     Ivery QualeHobson Aleyssa Pike, Cordelia Poche-C 06/10/16 1718    Bethann BerkshireJoseph Zammit, MD 06/10/16 2311

## 2016-06-10 NOTE — Discharge Instructions (Addendum)
Your examination is consistent with sciatica. Heating pad to your lower back maybe helpful. Please use medications called into [facility] by your doctor. Use flexeril, decadron and diclofenac until you get your Rx from  your MD.Please rest your back is much as possible.

## 2016-06-10 NOTE — ED Triage Notes (Signed)
Patient complaining of back pain "since April." Denies injury.

## 2016-06-10 NOTE — Telephone Encounter (Signed)
Medication Refill:   acetaminophen-codeine (TYLENOL #3) 300-30 MG tablet [[id number]][[id number]]  cyclobenzaprine (FLEXERIL) 10 MG tablet [[id number]][[id number]]   predniSONE (DELTASONE) 20 MG tablet [[id number]][[id number]]  [facility] [place] : [address], [place], [place] [phone]

## 2016-06-11 ENCOUNTER — Other Ambulatory Visit: Payer: Self-pay | Admitting: Internal Medicine

## 2016-06-11 ENCOUNTER — Telehealth: Payer: Self-pay | Admitting: General Practice

## 2016-06-11 DIAGNOSIS — IMO0001 Reserved for inherently not codable concepts without codable children: Secondary | ICD-10-CM

## 2016-06-11 MED ORDER — ACETAMINOPHEN-CODEINE #3 300-30 MG PO TABS: 1.0000 | ORAL_TABLET | ORAL | 0 refills | Status: DC | PRN

## 2016-06-11 MED ORDER — PREDNISONE 20 MG PO TABS: 20.0000 mg | ORAL_TABLET | Freq: Every day | ORAL | 0 refills | Status: AC

## 2016-06-11 MED ORDER — CYCLOBENZAPRINE HCL 10 MG PO TABS: 10.0000 mg | ORAL_TABLET | Freq: Three times a day (TID) | ORAL | 0 refills | Status: DC

## 2016-06-11 NOTE — Telephone Encounter (Signed)
Will forward to Dr. Hyman HopesJegede to get those results

## 2016-06-11 NOTE — Telephone Encounter (Signed)
Medical Assistant left message on patient's home and cell voicemail. Voicemail states to give a call back to [place] with [facility] at [phone].  !!!Please inform patient of prescriptions being sent electronically and patient may pick-up tylenol 3 prescription at the front desk tomorrow morning!!!

## 2016-06-11 NOTE — Telephone Encounter (Signed)
Pt. Called requesting his MRI results. Please f/u  °

## 2016-06-11 NOTE — Telephone Encounter (Signed)
Patient needs to pick up tylenol #3 prescription, the other 2 sent to the pharmacy. No more refills allowed except after reevaluation and ongoing need determined by a provider.

## 2016-06-12 ENCOUNTER — Other Ambulatory Visit: Payer: Self-pay | Admitting: Internal Medicine

## 2016-06-12 ENCOUNTER — Telehealth: Payer: Self-pay

## 2016-06-12 DIAGNOSIS — M48061 Spinal stenosis, lumbar region without neurogenic claudication: Secondary | ICD-10-CM

## 2016-06-12 NOTE — Telephone Encounter (Signed)
Contacted pt to go over MRI results pt is aware of results and is aware that the referral has been placed. Pt states he doesn't have any questions or concerns

## 2016-06-12 NOTE — Telephone Encounter (Signed)
Please inform patient that his MRI Lumbar spine showed some narrowing and nerve impingement. He needs a neurosurgical evaluation. We will refer. Ordered.

## 2016-06-13 ENCOUNTER — Other Ambulatory Visit: Payer: Self-pay | Admitting: Physician Assistant

## 2016-06-13 DIAGNOSIS — R937 Abnormal findings on diagnostic imaging of other parts of musculoskeletal system: Secondary | ICD-10-CM

## 2016-06-20 NOTE — Telephone Encounter (Signed)
Pt. Called requesting an Rx for pain. Pt. States he was referred to a specialist and has not been called for an appointment.  Please f/u with pt.

## 2016-06-21 ENCOUNTER — Other Ambulatory Visit: Payer: Self-pay | Admitting: Internal Medicine

## 2016-06-21 MED ORDER — DICLOFENAC SODIUM 75 MG PO TBEC: 75.0000 mg | DELAYED_RELEASE_TABLET | Freq: Two times a day (BID) | ORAL | 2 refills | Status: DC

## 2016-06-21 MED ORDER — CYCLOBENZAPRINE HCL 10 MG PO TABS: 10.0000 mg | ORAL_TABLET | Freq: Three times a day (TID) | ORAL | 0 refills | Status: DC

## 2016-06-21 NOTE — Telephone Encounter (Signed)
Pt. Called requesting an Rx for pain. Pt. States he was referred to a specialist and has not been called for an appointment.  Pt. States he will be out of medication this weekend. Please f/u with pt.

## 2016-06-21 NOTE — Telephone Encounter (Signed)
Contacted pt to make aware that Dr. Hyman HopesJegede refilled his diclofenac and flexeril for pain but will not be able to get anything else for pt also to make pt aware that it takes 1-2 weeks pt doesn't have any questions or concerns

## 2016-06-28 NOTE — Telephone Encounter (Signed)
Pt.  Called stating that the two medications that were called in are not helping and he would like to know if he can be prescribed something else. Pt. Also stated that he needs to speak with Marylene LandAngela. Please f/u

## 2016-07-01 ENCOUNTER — Other Ambulatory Visit: Payer: Self-pay | Admitting: Internal Medicine

## 2016-07-01 MED ORDER — PREDNISONE 20 MG PO TABS: 60.0000 mg | ORAL_TABLET | Freq: Every day | ORAL | 0 refills | Status: DC

## 2016-07-01 NOTE — Telephone Encounter (Signed)
Will forward to pcp

## 2016-07-01 NOTE — Telephone Encounter (Signed)
Pt calling requesting different medication for his pain Pt states his current medication is not working Pt requesting prednisone, pt states this has worked in the past  Pt states his pain is extreme and will go to the ED today if he has not heard back from nurse or provider

## 2016-07-01 NOTE — Telephone Encounter (Signed)
Called pt, confirmed dob. C/o of right sciatica/ Right back pain, down to right leg.   Mri reviewed. dw pt findings,  rx prednisone. Pt is pending eval by ortho if can get money together.

## 2016-07-10 ENCOUNTER — Telehealth: Payer: Self-pay | Admitting: Internal Medicine

## 2016-07-10 NOTE — Telephone Encounter (Signed)
Patient called the office to speak with nurse regarding medication. He needs refill to for predniSONE (DELTASONE) 20 MG tablet. Please call rx to [facility] in [place]. Please follow up. Patient is waiting on his medicaid to kick in in order to get an appointment with specialist. Please follow up.   Thank you.

## 2016-07-11 NOTE — Telephone Encounter (Signed)
Spoke with patient and he understands that refill for meds will not be given to him until he schedules an appointment. Pt said that he can't wait that long and therefore will go to the Urgent Care.

## 2016-07-11 NOTE — Telephone Encounter (Signed)
Will forward to stacey 

## 2016-07-11 NOTE — Telephone Encounter (Signed)
I cannot refill this. It must come from the provider. He doesn't have a PCP so [facility] Dr. Hyman HopesJegede.

## 2016-07-11 NOTE — Telephone Encounter (Signed)
Patient needs appointment.

## 2018-04-04 ENCOUNTER — Emergency Department (HOSPITAL_COMMUNITY): Payer: Self-pay

## 2018-04-04 ENCOUNTER — Emergency Department (HOSPITAL_COMMUNITY)
Admission: EM | Admit: 2018-04-04 | Discharge: 2018-04-05 | Disposition: A | Discharge status: Other Acute Inpatient Hospital | Payer: Self-pay | Attending: Emergency Medicine | Admitting: Emergency Medicine

## 2018-04-04 ENCOUNTER — Encounter (HOSPITAL_COMMUNITY): Payer: Self-pay

## 2018-04-04 ENCOUNTER — Other Ambulatory Visit: Payer: Self-pay

## 2018-04-04 DIAGNOSIS — T148XXA Other injury of unspecified body region, initial encounter: Secondary | ICD-10-CM

## 2018-04-04 DIAGNOSIS — F1120 Opioid dependence, uncomplicated: Secondary | ICD-10-CM | POA: Insufficient documentation

## 2018-04-04 DIAGNOSIS — F1910 Other psychoactive substance abuse, uncomplicated: Secondary | ICD-10-CM

## 2018-04-04 DIAGNOSIS — Y929 Unspecified place or not applicable: Secondary | ICD-10-CM | POA: Insufficient documentation

## 2018-04-04 DIAGNOSIS — F332 Major depressive disorder, recurrent severe without psychotic features: Secondary | ICD-10-CM | POA: Insufficient documentation

## 2018-04-04 DIAGNOSIS — R45851 Suicidal ideations: Secondary | ICD-10-CM | POA: Insufficient documentation

## 2018-04-04 DIAGNOSIS — Y999 Unspecified external cause status: Secondary | ICD-10-CM | POA: Insufficient documentation

## 2018-04-04 DIAGNOSIS — Z008 Encounter for other general examination: Secondary | ICD-10-CM | POA: Insufficient documentation

## 2018-04-04 DIAGNOSIS — Y9389 Activity, other specified: Secondary | ICD-10-CM | POA: Insufficient documentation

## 2018-04-04 DIAGNOSIS — Z23 Encounter for immunization: Secondary | ICD-10-CM | POA: Insufficient documentation

## 2018-04-04 DIAGNOSIS — X781XXA Intentional self-harm by knife, initial encounter: Secondary | ICD-10-CM | POA: Insufficient documentation

## 2018-04-04 DIAGNOSIS — M25512 Pain in left shoulder: Secondary | ICD-10-CM | POA: Insufficient documentation

## 2018-04-04 DIAGNOSIS — E876 Hypokalemia: Secondary | ICD-10-CM | POA: Insufficient documentation

## 2018-04-04 DIAGNOSIS — S51812A Laceration without foreign body of left forearm, initial encounter: Secondary | ICD-10-CM | POA: Insufficient documentation

## 2018-04-04 DIAGNOSIS — F17210 Nicotine dependence, cigarettes, uncomplicated: Secondary | ICD-10-CM | POA: Insufficient documentation

## 2018-04-04 LAB — CBC
HCT: 42.6 % (ref 39.0–52.0)
HEMOGLOBIN: 14.4 g/dL (ref 13.0–17.0)
MCH: 30.3 pg (ref 26.0–34.0)
MCHC: 33.8 g/dL (ref 30.0–36.0)
MCV: 89.7 fL (ref 78.0–100.0)
Platelets: 282 10*3/uL (ref 150–400)
RBC: 4.75 MIL/uL (ref 4.22–5.81)
RDW: 13 % (ref 11.5–15.5)
WBC: 8.6 10*3/uL (ref 4.0–10.5)

## 2018-04-04 LAB — BASIC METABOLIC PANEL
Anion gap: 7 (ref 5–15)
BUN: 5 mg/dL — AB (ref 6–20)
CHLORIDE: 102 mmol/L (ref 101–111)
CO2: 24 mmol/L (ref 22–32)
CREATININE: 1.07 mg/dL (ref 0.61–1.24)
Calcium: 8.3 mg/dL — ABNORMAL LOW (ref 8.9–10.3)
GFR calc Af Amer: >60 mL/min (ref >60)
GFR calc non Af Amer: >60 mL/min (ref >60)
Glucose, Bld: 107 mg/dL — ABNORMAL HIGH (ref 65–99)
Potassium: 3 mmol/L — ABNORMAL LOW (ref 3.5–5.1)
SODIUM: 133 mmol/L — AB (ref 135–145)

## 2018-04-04 LAB — ETHANOL

## 2018-04-04 MED ORDER — POTASSIUM CHLORIDE CRYS ER 20 MEQ PO TBCR
40.0000 meq | EXTENDED_RELEASE_TABLET | Freq: Once | ORAL | Status: AC
  Administered 2018-04-04: 40 meq via ORAL
  Filled 2018-04-04: qty 2

## 2018-04-04 MED ORDER — NICOTINE 21 MG/24HR TD PT24
21.0000 mg | MEDICATED_PATCH | Freq: Every day | TRANSDERMAL | Status: DC
  Administered 2018-04-04 – 2018-04-05 (×2): 21 mg via TRANSDERMAL
  Filled 2018-04-04 (×2): qty 1

## 2018-04-04 MED ORDER — ONDANSETRON 4 MG PO TBDP: 4.0000 mg | ORAL_TABLET | Freq: Four times a day (QID) | ORAL | Status: DC | PRN

## 2018-04-04 MED ORDER — TETANUS-DIPHTH-ACELL PERTUSSIS 5-2.5-18.5 LF-MCG/0.5 IM SUSP
0.5000 mL | Freq: Once | INTRAMUSCULAR | Status: AC
  Administered 2018-04-04: 0.5 mL via INTRAMUSCULAR
  Filled 2018-04-04: qty 0.5

## 2018-04-04 MED ORDER — ACETAMINOPHEN 325 MG PO TABS
650.0000 mg | ORAL_TABLET | ORAL | Status: DC | PRN
  Administered 2018-04-05: 650 mg via ORAL
  Filled 2018-04-04: qty 2

## 2018-04-04 MED ORDER — BACITRACIN ZINC 500 UNIT/GM EX OINT
TOPICAL_OINTMENT | CUTANEOUS | Status: AC
  Administered 2018-04-04: 1
  Filled 2018-04-04: qty 0.9
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
## 2018-04-04 NOTE — ED Notes (Signed)
Returned from xray
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
## 2018-04-04 NOTE — BH Assessment (Signed)
Assessment Note  Michael [name] is a 37 y.o. single male who presents voluntarily to [facility] for assessment. Pt is reporting symptoms of depression and suicidal ideation. He has a history of heroin and crack cocaine addiction. Pt reports current suicidal ideation with plans to dive in front of a moving vehicle or cut himself with a knife. Pt states he cut himself last night, but did not require sutures. Pt reports a past suicide attempt at 37 years old when he tried to hang himself. He received inpt tx at that time. Pt denies homicidal ideation/ history of violence. He also denies AVH and other psychotic symptoms. Pt states current stressors include addiction, financial problems, family conflict, lack of employment, lack of housing (his mother kicked him out of her home 2 days ago), and hx of depressed mood.  Pt is newly homeless and feels abandoned/unsupported by his family. He reports he does not have many friends. Pt has poor insight and judgment. Pt's memory is fair. He denies legal problems.  Pt is casually dressed, alert, oriented x4 with normal speech and normal motor behavior. Eye contact is fair. Pt's mood is depressed and affect is flat. There is no indication pt is responding to internal stimuli or experiencing delusional thought content. Pt was cooperative throughout assessment.   Disposition: Pt is currently unable to contract for safety outside the hospital and wants inpatient psychiatric treatment. Malachy Chamber, NP recommends inpatient treatment. [facility] has no appropriate beds. TTS to seek placement.  Diagnosis: F33.2 Major depressive disorder, Recurrent episode, Severe; F11.20 Opioid use disorder, Severe   Past Medical History:  Past Medical History:  Diagnosis Date  . Sciatica     No past surgical history on file.  Family History: No family history on file.  Social History:  reports that he has been smoking cigarettes.  He has been smoking about 0.50 packs per day. He has never  used smokeless tobacco. He reports that he drinks alcohol. He reports that he does not use drugs.  Additional Social History:  Alcohol / Drug Use Pain Medications: see MAR Prescriptions: see MAR Over the Counter: see MAR History of alcohol / drug use?: Yes Longest period of sobriety (when/how long): 1 year Negative Consequences of Use: Financial, Personal relationships, Work / School Substance #1 Name of Substance 1: heroin 1 - Age of First Use: 36 1 - Amount (size/oz): $60-$100 1 - Frequency: daily 1 - Last Use / Amount: 04/02/18 Substance #2 Name of Substance 2: crack cocaine 2 - Age of First Use: 21 2 - Amount (size/oz): $150 2 - Frequency: 3-4x weekly 2 - Last Use / Amount: 04/03/18  CIWA: CIWA-Ar BP: 116/86 Pulse Rate: 71 COWS:    Allergies: No Known Allergies  Home Medications:  (Not in a hospital admission)  OB/GYN Status:  No LMP for male patient.  General Assessment Data [facility] of Assessment: [facility] TTS Assessment: In system Is this a Tele or Face-to-Face Assessment?: Tele Assessment Is this an Initial Assessment or a Re-assessment for this encounter?: Initial Assessment Marital status: Single Living Arrangements: Alone(homeless x couple days; m kicked pt out) Can pt return to current living arrangement?: (n/a) Admission Status: Voluntary Is patient capable of signing voluntary admission?: Yes Referral Source: Self/Family/Friend Insurance type: none     Crisis Care Plan Living Arrangements: Alone(homeless x couple days; m kicked pt out) Name of Psychiatrist: none currently Name of Therapist: none currently  Education Status Is patient currently in school?: No Is the patient employed, unemployed  or receiving disability?: Unemployed  Risk to self with the past 6 months Suicidal Ideation: Yes-Currently Present Has patient been a risk to self within the past 6 months prior to admission? : Yes Suicidal Intent: Yes-Currently Present Has patient had any  suicidal intent within the past 6 months prior to admission? : Yes Is patient at risk for suicide?: Yes Suicidal Plan?: Yes-Currently Present Has patient had any suicidal plan within the past 6 months prior to admission? : Yes(dive in front of car) Specify Current Suicidal Plan: cut arm today- no stitches needed Access to Means: Yes(knives yes, firearms no) What has been your use of drugs/alcohol within the last 12 months?: heroin daily Previous Attempts/Gestures: Yes How many times?: 5 Other Self Harm Risks: heroin use Triggers for Past Attempts: Unpredictable Intentional Self Injurious Behavior: Cutting Family Suicide History: Unknown Recent stressful life event(s): Financial Problems Persecutory voices/beliefs?: No Depression: Yes Depression Symptoms: Despondent, Insomnia, Tearfulness, Isolating, Fatigue, Guilt, Loss of interest in usual pleasures, Feeling worthless/self pity, Feeling angry/irritable Substance abuse history and/or treatment for substance abuse?: Yes Suicide prevention information given to non-admitted patients: Not applicable  Risk to Others within the past 6 months Homicidal Ideation: No Does patient have any lifetime risk of violence toward others beyond the six months prior to admission? : No Thoughts of Harm to Others: No Current Homicidal Intent: No Current Homicidal Plan: No Access to Homicidal Means: No History of harm to others?: No Assessment of Violence: None Noted Does patient have access to weapons?: No Criminal Charges Pending?: No Does patient have a court date: No Is patient on probation?: No  Psychosis Hallucinations: None noted Delusions: None noted     Cognitive Functioning Concentration: Decreased Memory: Recent Impaired, Remote Impaired Is patient IDD: No Is patient DD?: No Insight: Poor Impulse Control: Poor Appetite: Poor Have you had any weight changes? : Loss Amount of the weight change? (lbs): 30 lbs(6 months) Sleep: No  Change Total Hours of Sleep: (varies) Vegetative Symptoms: Staying in bed  ADLScreening [facility] Assessment Services) Patient's cognitive ability adequate to safely complete daily activities?: Yes Patient able to express need for assistance with ADLs?: Yes Independently performs ADLs?: Yes (appropriate for developmental age)  Prior Inpatient Therapy Prior Inpatient Therapy: Yes Prior Therapy Dates: 37 years old, 20's(hanging attempt; drug rehab) Prior Therapy [place]ilty/[place] [facility] Reason for Treatment: SI; drug rehab     ADL Screening (condition at time of admission) Patient's cognitive ability adequate to safely complete daily activities?: Yes Is the patient deaf or have difficulty hearing?: No Does the patient have difficulty seeing, even when wearing glasses/contacts?: No Does the patient have difficulty concentrating, remembering, or making decisions?: No Patient able to express need for assistance with ADLs?: Yes Does the patient have difficulty dressing or bathing?: No Independently performs ADLs?: Yes (appropriate for developmental age) Does the patient have difficulty walking or climbing stairs?: No Weakness of Legs: None Weakness of Arms/Hands: None  Home Assistive Devices/Equipment Home Assistive Devices/Equipment: None    Abuse/Neglect Assessment (Assessment to be complete while patient is alone) Abuse/Neglect Assessment Can Be Completed: Yes Physical Abuse: Denies Verbal Abuse: Denies Sexual Abuse: Denies Exploitation of patient/patient's resources: Denies Self-Neglect: Denies Values / Beliefs Cultural Requests During Hospitalization: None Spiritual Requests During Hospitalization: None Consults Spiritual Care Consult Needed: No Social Work Consult Needed: No Merchant navy officerAdvance Directives (For Healthcare) Does Patient Have a Medical Advance Directive?: No Would patient like information on creating a medical advance directive?: No - Patient declined  Disposition:  Disposition Initial Assessment Completed for this Encounter: Yes Disposition of Patient: (pending consult with NP)  On Site Evaluation by:   Reviewed with Physician:    Clearnce Sorrel 04/04/2018 5:32 PM

## 2018-04-04 NOTE — BH Assessment (Signed)
Faxed clinical information to the following facilities for placement:  [facility]  [facility]  [facility]    [address]Ibraheem Voris Ellis Patsy BaltimoreWarrick Jr, [facility], [facility], [facility] Triage Specialist [phone]

## 2018-04-04 NOTE — ED Provider Notes (Signed)
[facility] EMERGENCY DEPARTMENT Provider Note   CSN: [id number] Arrival date & time: 04/04/18  1151     History   Chief Complaint Chief Complaint  Patient presents with  . V70.1    HPI Michael PriesRobert J [name] is a 37 y.o. male.  HPI Patient states he fell out of bed this morning landing on his left shoulder.  Denies hitting his head.  Denies loss of consciousness.  Has pain of the left shoulder that is worse with movement.  No weakness or numbness.  Patient also admits to polysubstance abuse including heroin and cocaine.  Denies alcohol use.  States he is having increased depressive thoughts because his friends are abandoning of him over his drug abuse.  Patient states he made superficial cuts to his right forearm and an effort to harm himself.  Patient has had inpatient drug rehabilitation but this was roughly 10 years ago.  He is currently on no psychiatric medications.  Unknown last tetanus shot. Past Medical History:  Diagnosis Date  . Sciatica     There are no active problems to display for this patient.   No past surgical history on file.      Home Medications    Prior to Admission medications   Medication Sig Start Date End Date Taking? Authorizing Provider  acetaminophen-codeine (TYLENOL #3) 300-30 MG tablet Take 1 tablet by mouth every 4 (four) hours as needed for moderate pain. 06/11/16   Quentin AngstJegede, Olugbemiga E, MD    Family History No family history on file.  Social History Social History   Tobacco Use  . Smoking status: Current Every Day Smoker    Packs/day: 0.50    Types: Cigarettes  . Smokeless tobacco: Never Used  Substance Use Topics  . Alcohol use: Yes    Comment: "rarely"  . Drug use: No     Allergies   Patient has no known allergies.   Review of Systems Review of Systems  Constitutional: Negative for chills and fever.  Eyes: Negative for visual disturbance.  Respiratory: Negative for shortness of breath.   Cardiovascular: Negative for  chest pain.  Gastrointestinal: Negative for abdominal pain, constipation, diarrhea, nausea and vomiting.  Musculoskeletal: Positive for arthralgias and myalgias. Negative for back pain and neck pain.  Skin: Negative for rash and wound.  Neurological: Negative for dizziness, weakness, light-headedness, numbness and headaches.  Psychiatric/Behavioral: Positive for dysphoric mood, self-injury and suicidal ideas.  All other systems reviewed and are negative.    Physical Exam Updated Vital Signs BP 116/86 (BP Location: Left Arm)   Pulse 71   Temp 99.6 F (37.6 C) (Oral)   Resp 16   Ht 5\' 6"  (1.676 m)   Wt 77.1 kg (170 lb)   SpO2 100%   BMI 27.44 kg/m   Physical Exam  Constitutional: He is oriented to person, place, and time. He appears well-developed and well-nourished. No distress.  HENT:  Head: Normocephalic and atraumatic.  Mouth/Throat: Oropharynx is clear and moist.  No obvious head injury  Eyes: Pupils are equal, round, and reactive to light. EOM are normal.  Neck: Normal range of motion. Neck supple.  No posterior midline cervical tenderness to palpation.  Cardiovascular: Normal rate and regular rhythm.  Pulmonary/Chest: Effort normal and breath sounds normal.  Abdominal: Soft. Bowel sounds are normal. There is no tenderness. There is no rebound and no guarding.  Musculoskeletal: Normal range of motion. He exhibits no edema or tenderness.  No tenderness to palpation over the left clavicle, left deltoid,  left scapula.  Patient has mild pain with abduction of the left shoulder.  Full range of motion of left elbow and left wrist.  Distal pulses intact.  Neurological: He is alert and oriented to person, place, and time.  5/5 motor in all extremities.  Sensation fully intact.  Skin: Skin is warm and dry. No rash noted. He is not diaphoretic. No erythema.  Patient has several linear, very superficial lacerations to the right forearm.  Does not go into the dermis.  No active  bleeding.  Psychiatric:  Dysphoric mood.  Suicidal ideation.  No visual or auditory hallucinations.  Nursing note and vitals reviewed.    ED Treatments / Results  Labs (all labs ordered are listed, but only abnormal results are displayed) Labs Reviewed  BASIC METABOLIC PANEL - Abnormal; Notable for the following components:      Result Value   Sodium 133 (*)    Potassium 3.0 (*)    Glucose, Bld 107 (*)    BUN 5 (*)    Calcium 8.3 (*)    All other components within normal limits  CBC  ETHANOL  RAPID URINE DRUG SCREEN, HOSP PERFORMED    EKG None  Radiology Dg Shoulder Left  Result Date: 04/04/2018 CLINICAL DATA:  Left shoulder pain EXAM: LEFT SHOULDER - 2+ VIEW COMPARISON:  None. FINDINGS: There is no evidence of fracture or dislocation. There is no evidence of arthropathy or other focal bone abnormality. Soft tissues are unremarkable. IMPRESSION: Negative. Electronically Signed   By: Charlett Nose M.D.   On: 04/04/2018 12:44    Procedures Procedures (including critical care time)  Medications Ordered in ED Medications  nicotine (NICODERM CQ - dosed in mg/24 hours) patch 21 mg (21 mg Transdermal Patch Applied 04/04/18 1754)  ondansetron (ZOFRAN-ODT) disintegrating tablet 4 mg (has no administration in time range)  acetaminophen (TYLENOL) tablet 650 mg (has no administration in time range)  Tdap (BOOSTRIX) injection 0.5 mL (0.5 mLs Intramuscular Given 04/04/18 1257)  bacitracin 500 UNIT/GM ointment (1 application  Given 04/04/18 1257)  potassium chloride SA (K-DUR,KLOR-CON) CR tablet 40 mEq (40 mEq Oral Given 04/04/18 1502)     Initial Impression / Assessment and Plan / ED Course  I have reviewed the triage vital signs and the nursing notes.  Pertinent labs & imaging results that were available during my care of the patient were reviewed by me and considered in my medical decision making (see chart for details).     Given oral potassium replacement.  Tetanus was updated.   Superficial lacerations cleaned but do not need repair.  Patient is medically cleared for psychiatric evaluation.  He is voluntary at this time.  Final Clinical Impressions(s) / ED Diagnoses   Final diagnoses:  Polysubstance abuse (HCC)  Superficial laceration  Hypokalemia    ED Discharge Orders    None       Loren Racer, MD 04/04/18 2119

## 2018-04-05 LAB — RAPID URINE DRUG SCREEN, HOSP PERFORMED
AMPHETAMINES: NOT DETECTED
BENZODIAZEPINES: POSITIVE — AB
COCAINE: POSITIVE — AB
Opiates: NOT DETECTED
TETRAHYDROCANNABINOL: POSITIVE — AB

## 2018-04-05 MED ORDER — LORAZEPAM 1 MG PO TABS
1.0000 mg | ORAL_TABLET | Freq: Once | ORAL | Status: AC
  Administered 2018-04-05: 1 mg via ORAL
  Filled 2018-04-05: qty 1

## 2018-04-05 NOTE — ED Notes (Signed)
[facility] transport arrived to transport pt,

## 2018-04-05 NOTE — ED Notes (Signed)
Pt given the phone at this time.  

## 2018-04-05 NOTE — ED Notes (Signed)
Lunch tray given at this time.  

## 2018-04-05 NOTE — ED Notes (Signed)
[facility] seeking placement at this time and have not heard back from any facility.

## 2018-04-05 NOTE — ED Notes (Signed)
Report given to Yvette, RN

## 2018-04-05 NOTE — ED Notes (Signed)
Snack provided for pt 

## 2018-04-05 NOTE — ED Provider Notes (Signed)
[facility] Jennet MaduroPucilowska. Will transfer stable.    Samuel JesterMcManus, Bodi Palmeri, DO 04/05/18 2247

## 2018-04-05 NOTE — ED Notes (Signed)
Pt eating dinner tray °

## 2018-04-05 NOTE — ED Notes (Signed)
[facility] Pt [phone]

## 2018-04-06 ENCOUNTER — Inpatient Hospital Stay
Admission: AD | Admit: 2018-04-06 | Discharge: 2018-04-13 | DRG: 885 | Disposition: A | Discharge status: Other Acute Inpatient Hospital | Payer: No Typology Code available for payment source | Source: Intra-hospital | Attending: Psychiatry | Admitting: Psychiatry

## 2018-04-06 ENCOUNTER — Other Ambulatory Visit: Payer: Self-pay

## 2018-04-06 DIAGNOSIS — K219 Gastro-esophageal reflux disease without esophagitis: Secondary | ICD-10-CM | POA: Diagnosis present

## 2018-04-06 DIAGNOSIS — Z915 Personal history of self-harm: Secondary | ICD-10-CM

## 2018-04-06 DIAGNOSIS — F419 Anxiety disorder, unspecified: Secondary | ICD-10-CM | POA: Diagnosis present

## 2018-04-06 DIAGNOSIS — G4700 Insomnia, unspecified: Secondary | ICD-10-CM | POA: Diagnosis present

## 2018-04-06 DIAGNOSIS — F332 Major depressive disorder, recurrent severe without psychotic features: Secondary | ICD-10-CM | POA: Diagnosis present

## 2018-04-06 DIAGNOSIS — Z818 Family history of other mental and behavioral disorders: Secondary | ICD-10-CM

## 2018-04-06 DIAGNOSIS — F410 Panic disorder [episodic paroxysmal anxiety] without agoraphobia: Secondary | ICD-10-CM | POA: Diagnosis present

## 2018-04-06 DIAGNOSIS — F1010 Alcohol abuse, uncomplicated: Secondary | ICD-10-CM | POA: Diagnosis present

## 2018-04-06 DIAGNOSIS — R45851 Suicidal ideations: Secondary | ICD-10-CM | POA: Diagnosis present

## 2018-04-06 DIAGNOSIS — F22 Delusional disorders: Secondary | ICD-10-CM | POA: Diagnosis present

## 2018-04-06 DIAGNOSIS — F1220 Cannabis dependence, uncomplicated: Secondary | ICD-10-CM | POA: Diagnosis present

## 2018-04-06 DIAGNOSIS — F429 Obsessive-compulsive disorder, unspecified: Secondary | ICD-10-CM | POA: Diagnosis present

## 2018-04-06 DIAGNOSIS — Z720 Tobacco use: Secondary | ICD-10-CM | POA: Diagnosis present

## 2018-04-06 DIAGNOSIS — Z79899 Other long term (current) drug therapy: Secondary | ICD-10-CM

## 2018-04-06 DIAGNOSIS — F1420 Cocaine dependence, uncomplicated: Secondary | ICD-10-CM | POA: Diagnosis present

## 2018-04-06 DIAGNOSIS — F17200 Nicotine dependence, unspecified, uncomplicated: Secondary | ICD-10-CM | POA: Diagnosis present

## 2018-04-06 DIAGNOSIS — F1320 Sedative, hypnotic or anxiolytic dependence, uncomplicated: Secondary | ICD-10-CM | POA: Diagnosis present

## 2018-04-06 LAB — COMPREHENSIVE METABOLIC PANEL
ALT: 11 U/L — AB (ref 17–63)
AST: 17 U/L (ref 15–41)
Albumin: 3.6 g/dL (ref 3.5–5.0)
Alkaline Phosphatase: 49 U/L (ref 38–126)
Anion gap: 8 (ref 5–15)
BUN: 8 mg/dL (ref 6–20)
CHLORIDE: 110 mmol/L (ref 101–111)
CO2: 22 mmol/L (ref 22–32)
CREATININE: 0.94 mg/dL (ref 0.61–1.24)
Calcium: 8.4 mg/dL — ABNORMAL LOW (ref 8.9–10.3)
Glucose, Bld: 106 mg/dL — ABNORMAL HIGH (ref 65–99)
POTASSIUM: 3.5 mmol/L (ref 3.5–5.1)
SODIUM: 140 mmol/L (ref 135–145)
Total Bilirubin: 0.5 mg/dL (ref 0.3–1.2)
Total Protein: 6.3 g/dL — ABNORMAL LOW (ref 6.5–8.1)

## 2018-04-06 LAB — LIPID PANEL
CHOL/HDL RATIO: 3.8 ratio
CHOLESTEROL: 139 mg/dL (ref 0–200)
HDL: 37 mg/dL — AB (ref >40)
LDL CALC: 73 mg/dL (ref 0–99)
TRIGLYCERIDES: 146 mg/dL (ref <150)
VLDL: 29 mg/dL (ref 0–40)

## 2018-04-06 LAB — TSH: TSH: 0.679 u[IU]/mL (ref 0.350–4.500)

## 2018-04-06 MED ORDER — RISPERIDONE 1 MG PO TABS
2.0000 mg | ORAL_TABLET | Freq: Two times a day (BID) | ORAL | Status: DC
  Administered 2018-04-06 – 2018-04-12 (×13): 2 mg via ORAL
  Filled 2018-04-06 (×13): qty 2

## 2018-04-06 MED ORDER — ALUM & MAG HYDROXIDE-SIMETH 200-200-20 MG/5ML PO SUSP
30.0000 mL | ORAL | Status: DC | PRN
  Administered 2018-04-10 – 2018-04-12 (×3): 30 mL via ORAL
  Filled 2018-04-06 (×3): qty 30

## 2018-04-06 MED ORDER — MAGNESIUM HYDROXIDE 400 MG/5ML PO SUSP: 30.0000 mL | Freq: Every day | ORAL | Status: DC | PRN

## 2018-04-06 MED ORDER — TRAZODONE HCL 100 MG PO TABS
100.0000 mg | ORAL_TABLET | Freq: Every day | ORAL | Status: DC
  Administered 2018-04-06: 100 mg via ORAL
  Filled 2018-04-06: qty 1

## 2018-04-06 MED ORDER — NICOTINE 21 MG/24HR TD PT24
21.0000 mg | MEDICATED_PATCH | Freq: Every day | TRANSDERMAL | Status: DC
  Administered 2018-04-06 – 2018-04-12 (×7): 21 mg via TRANSDERMAL
  Filled 2018-04-06 (×9): qty 1

## 2018-04-06 MED ORDER — HYDROXYZINE HCL 50 MG PO TABS
50.0000 mg | ORAL_TABLET | Freq: Three times a day (TID) | ORAL | Status: DC | PRN
  Administered 2018-04-06 – 2018-04-08 (×3): 50 mg via ORAL
  Filled 2018-04-06 (×4): qty 1

## 2018-04-06 MED ORDER — TRAZODONE HCL 50 MG PO TABS: 50.0000 mg | ORAL_TABLET | Freq: Every evening | ORAL | Status: DC | PRN

## 2018-04-06 MED ORDER — ACETAMINOPHEN 325 MG PO TABS
650.0000 mg | ORAL_TABLET | ORAL | Status: DC | PRN
  Administered 2018-04-06 – 2018-04-11 (×10): 650 mg via ORAL
  Filled 2018-04-06 (×9): qty 2

## 2018-04-06 MED ORDER — FLUVOXAMINE MALEATE 50 MG PO TABS
50.0000 mg | ORAL_TABLET | Freq: Every day | ORAL | Status: DC
  Administered 2018-04-06: 50 mg via ORAL
  Filled 2018-04-06: qty 1

## 2018-04-06 MED ORDER — CLONIDINE HCL 0.1 MG PO TABS
0.1000 mg | ORAL_TABLET | Freq: Three times a day (TID) | ORAL | Status: DC
  Administered 2018-04-06 – 2018-04-08 (×5): 0.1 mg via ORAL
  Filled 2018-04-06 (×5): qty 1

## 2018-04-06 MED ORDER — ONDANSETRON 4 MG PO TBDP: 4.0000 mg | ORAL_TABLET | Freq: Four times a day (QID) | ORAL | Status: DC | PRN

## 2018-04-06 NOTE — Plan of Care (Signed)
Patient  makings decisions regarding his  treatment  on the unit . Able to talk  with representative  [facility]  [facility] . Stated he didn't  know if he could preform the tasks involved .  Working on Pharmacologistcoping skills . Voice of no safety concerns  . Interacting  with others on the unit   . Maintained  good eye contact . Verbalize understanding of  medication received . No problems with elimination of bowels .  Problem: Safety: Goal: Ability to remain free from injury will improve Outcome: Progressing   Problem: Self-Concept: Goal: Ability to disclose and discuss suicidal ideas will improve Outcome: Progressing Goal: Will verbalize positive feelings about self Outcome: Progressing   Problem: Coping: Goal: Coping ability will improve Outcome: Progressing   Problem: Education: Goal: Ability to make informed decisions regarding treatment will improve Outcome: Progressing
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
## 2018-04-06 NOTE — Tx Team (Signed)
Initial Treatment Plan 04/06/2018 1:48 AM Michael Hewobert Jackson Feldkamp [name] WUJ:[id number]    PATIENT STRESSORS: Financial difficulties Occupational concerns Substance abuse   PATIENT STRENGTHS: Average or above average intelligence Capable of independent living Communication skills Motivation for treatment/growth Physical Health   PATIENT IDENTIFIED PROBLEMS: Suicide Ideation    Polly substance miss use    Depression/Anxiety    Financial difficulties     Job Less     DISCHARGE CRITERIA:  Ability to meet basic life and health needs Improved stabilization in mood, thinking, and/or behavior Motivation to continue treatment in a less acute level of care Reduction of life-threatening or endangering symptoms to within safe limits Verbal commitment to aftercare and medication compliance  PRELIMINARY DISCHARGE PLAN: Attend aftercare/continuing care group Attend 12-step recovery group Participate in family therapy Placement in alternative living arrangements  PATIENT/FAMILY INVOLVEMENT: This treatment plan has been presented to and reviewed with the patient, Michael [name], .  The patient  have been given the opportunity to ask questions and make suggestions.  Lelan PonsAlexander  Saleemah Mollenhauer, RN 04/06/2018, 1:48 AM

## 2018-04-06 NOTE — Progress Notes (Signed)
[facility], alert and oriented x 4, who is involve in multiple illicit drug use, present voluntary and report being suicidal with a plan to cut himself with a knife or jump in front of a moving vehicle, states cutting himself on the wrist but not enough to require sutures, patient states he is homeless and jobless and have financial issues, also states that his mother kicked him out of the house 2 days ago. Patient is cooperating with assessments, skin is clean had cutting marks on his right fore arm that is open to air, body search done by 2 RNs and no contrabands was found, patient contracts for safety of self and others, no AVH and denies any suicidal ideations at this time, patient is monitored frequently and 15 minute rounding is maintained no distress noted.

## 2018-04-06 NOTE — Progress Notes (Signed)
Mr. Betsey HolidayMathis did not attend group on tonight.    Cuma Polyakov Homer GlenMonroe BSW, MHT 04/06/2018

## 2018-04-06 NOTE — Progress Notes (Addendum)
D: Patient stated slept fair last night .Stated appetite fair and energy level low Stated concentrationpoor . Stated on Depression scale, 8 hopeless 8  and anxiety 8 . ( low 0-10 high) Voiced of  suicidal ideations.  No auditory hallucinations  Having pain concerns  . Appropriate ADL'S. Interacting with peers and staff.   Patient reports drug of choice is heroin  Patient  makings decisions regarding his  treatment  on the unit . [facility] . Stated he didn't  know if he could preform the tasks involved .  Working on Pharmacologistcoping skills . Voice of no safety concerns  . Interacting  with others on the unit   . Maintained  good eye contact . Verbalize understanding of  medication received . No problems with elimination of bowels . A: Encourage patient participation with unit programming . Instruction  Given on  Medication , verbalize understanding. R: Voice no other concerns. Staff continue to monitor

## 2018-04-06 NOTE — [facility] Suicide Risk Assessment (Signed)
[facility]   Nursing information obtained from:  Patient Demographic factors:  Caucasian Current Mental Status:  Self-harm thoughts Loss Factors:  Financial problems / change in socioeconomic status Historical Factors:  NA Risk Reduction Factors:  Positive therapeutic relationship  Total Time spent with patient: 1 hour Principal Problem: Major depressive disorder, recurrent severe without psychotic features (HCC) Diagnosis:   Patient Active Problem List   Diagnosis Date Noted  . 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 Cannabis use disorder, moderate, dependence (HCC) [F12.20] 04/06/2018  . Cocaine use disorder, moderate, dependence (HCC) [F14.20] 04/06/2018  . Sedative, hypnotic or anxiolytic use disorder, severe, dependence (HCC) [F13.20] 04/06/2018  . Tobacco use disorder [F17.200] 04/06/2018   Subjective Data: suicidal ideation.  Continued Clinical Symptoms:  Alcohol Use Disorder Identification Test Final Score (AUDIT): 7 The "Alcohol Use Disorders Identification Test", Guidelines for Use in Primary Care, Second Edition.  World Science writerHealth Organization [facility](WHO). Score between 0-7:  no or low risk or alcohol related problems. Score between 8-15:  moderate risk of alcohol related problems. Score between 16-19:  high risk of alcohol related problems. Score 20 or above:  warrants further diagnostic evaluation for alcohol dependence and treatment.   CLINICAL FACTORS:   Depression:   Impulsivity Insomnia Alcohol/Substance Abuse/Dependencies Obsessive-Compulsive Disorder   Musculoskeletal: Strength & Muscle Tone: within normal limits Gait & Station: normal Patient leans: N/A  Psychiatric Specialty Exam: Physical Exam  Nursing note and vitals reviewed. Psychiatric: His speech is normal and behavior is normal. His mood appears anxious. Thought content is paranoid. Cognition and  memory are normal. He expresses impulsivity. He exhibits a depressed mood. He expresses suicidal ideation. He expresses suicidal plans.    Review of Systems  Neurological: Negative.   Psychiatric/Behavioral: Positive for depression, substance abuse and suicidal ideas. The patient is nervous/anxious and has insomnia.   All other systems reviewed and are negative.   Blood pressure (!) 122/95, pulse 81, temperature 97.7 F (36.5 C), temperature source Oral, resp. rate 18, height 5\' 6"  (1.676 m), weight 77.1 kg (170 lb), SpO2 100 %.Body mass index is 27.44 kg/m.  General Appearance: Casual  Eye Contact:  Good  Speech:  Clear and Coherent  Volume:  Normal  Mood:  Depressed  Affect:  Flat  Thought Process:  Goal Directed and Descriptions of Associations: Intact  Orientation:  Full (Time, Place, and Person)  Thought Content:  Delusions  Suicidal Thoughts:  Yes.  with intent/plan  Homicidal Thoughts:  No  Memory:  Immediate;   Fair Recent;   Fair Remote;   Fair  Judgement:  Poor  Insight:  Lacking  Psychomotor Activity:  Decreased  Concentration:  Concentration: Fair and Attention Span: Fair  Recall:  FiservFair  Fund of Knowledge:  Fair  Language:  Fair  Akathisia:  No  Handed:  Right  AIMS (if indicated):     Assets:  Communication Skills Desire for Improvement Physical Health Resilience  ADL's:  Intact  Cognition:  WNL  Sleep:         COGNITIVE FEATURES THAT CONTRIBUTE TO RISK:  None    SUICIDE RISK:   Moderate:  Frequent suicidal ideation with limited intensity, and duration, some specificity in terms of plans, no associated intent, good self-control, limited dysphoria/symptomatology, some risk factors present, and identifiable protective factors, including available and accessible social support.  PLAN OF CARE: hospital admission, medication management, substance abuse counseling, discharge planning.  Mr.  Michael [name] is a 37 year old male with a history of depression and  substance abuse admitted for suicidal ideation with a plan in the context of substance use and severe social stressors.   #Suicidal ideation -patient able to contract for safety in the hos[pital  #Mood/anxiety -start Tegretol 200 mg BID -start Luvox for OCD -start Risperdal for augmentation  #Insomnia -Trazodone 100 mg night6ly  #Substance abuse -positive for cocaine, cannabis, benzodiazepine, admits to heroine as well, no alcohol -symptomatic treatment for opioid withdrawal  #Labs -lipid panel, TSH and A1C -EKG  #Shoulder pain -Xray negative -Motrin 600 mg PRN  #Substance abuse treatment -negotiates with [facility] and [facility]  #Disposition -TBE   I certify that inpatient services furnished can reasonably be expected to improve the patient's condition.   Kristine Linea, MD 04/06/2018, 1:09 PM

## 2018-04-06 NOTE — Progress Notes (Signed)
   04/06/18 1040  Clinical Encounter Type  Visited With Patient  Visit Type Initial;Spiritual support;Behavioral Health  Referral From Care management  Consult/Referral To Chaplain  Spiritual Encounters  Spiritual Needs Other (Comment)   CH attended PT's care team meeting. PT appeared to be very flipped about his drug use and desire to kill himself. PT stated that heroine is his drug of choice but also likes crack cocaine as well. PT stated that he attempted to hang himself when he was 18 and is always thinking about killing himself. CH will make himself available for PT if needed to talk.

## 2018-04-06 NOTE — [facility] Suicide Risk Assessment (Signed)
[facility] INPATIENT:  Family/Significant Other Suicide Prevention Education  Suicide Prevention Education:  Patient Refusal for Family/Significant Other Suicide Prevention Education: The patient Michael [name] has refused to provide written consent for family/significant other to be provided Family/Significant Other Suicide Prevention Education during admission and/or prior to discharge.  Physician notified.  Michael [name] 04/06/2018, 11:57 AM

## 2018-04-06 NOTE — H&P (Signed)
Psychiatric Admission Assessment Adult  Patient Identification: Michael [name] MRN:  [id number] Date of Evaluation:  04/06/2018 Chief Complaint:  Major depressive disorder, recurrent episode, severe, without mention of psychotic behavior Principal Diagnosis: Major depressive disorder, recurrent severe without psychotic features ([place]) Diagnosis:   Patient Active Problem List   Diagnosis Date Noted  . Major depressive disorder, recurrent severe without psychotic features ([place]) [F33.2] 04/06/2018    Priority: High  . Cannabis use disorder, moderate, dependence ([place]) [F12.20] 04/06/2018  . Cocaine use disorder, moderate, dependence ([place]) [F14.20] 04/06/2018  . Sedative, hypnotic or anxiolytic use disorder, severe, dependence ([place]) [F13.20] 04/06/2018  . Tobacco use disorder [F17.200] 04/06/2018   History of Present Illness:   Identifying data. Michael [name] is a 37 year old male with a history of depression, anxiety and substance abuse.  Chief complaint. "I just don't want to live."  History of present illness. Information was obtained from the patient and the chart. The patient came to [facility] complaining of suicidal ideation with a plan to step in front of traffic. He has been under considerable stress after loosing his job and housing due to substance use. He decided to ask for help with "chemical imbalance" and substance abuse. He reports many symptoms of depression with poor sleep, decreased appetite, anhedonia, feeling of guilt hopelessness worthlessness, poor energy and concentration, social isolation, crying spells, heightened anxiety that culminated in suicidal thinking. He reports paranoia, voice in his head that is "his conscious". He reports panic attacks, "everytime I think of the future", and OCD symptoms. He uses multiple drugs. Heroine is his first choice. He does not drink alcohol.   Past psychiatric history. Depressed and suicidal all his life. One suicide attempt by  hanging at the age of 21 for which he was hospitalized and received therapy. He does not remember names of medications given. Long history of substance abuse with one rehab at [facility] 15 or so years ago.   Family psychiatric history. Brother with panic disorder.  Social history. Dropped out of school at 46 but did get GEDs. He has has multiple jobs over the years, unable to keep one. For the past 8 years, he has been living with his "ex-old lady" and her father but the father kicked him out. He briefly moved with his mother but was also kicked out.   Total Time spent with patient: 1 hour  Is the patient at risk to self? Yes.    Has the patient been a risk to self in the past 6 months? No.  Has the patient been a risk to self within the distant past? Yes.    Is the patient a risk to others? No.  Has the patient been a risk to others in the past 6 months? No.  Has the patient been a risk to others within the distant past? No.   Prior Inpatient Therapy:   Prior Outpatient Therapy:    Alcohol Screening: 1. How often do you have a drink containing alcohol?: Monthly or less 2. How many drinks containing alcohol do you have on a typical day when you are drinking?: 3 or 4 3. How often do you have six or more drinks on one occasion?: Never AUDIT-C Score: 2 4. How often during the last year have you found that you were not able to stop drinking once you had started?: Less than monthly 5. How often during the last year have you failed to do what was normally expected from you becasue of drinking?: Less  than monthly 6. How often during the last year have you needed a first drink in the morning to get yourself going after a heavy drinking session?: Less than monthly 7. How often during the last year have you had a feeling of guilt of remorse after drinking?: Less than monthly 8. How often during the last year have you been unable to remember what happened the night before because you had been drinking?:  Less than monthly 9. Have you or someone else been injured as a result of your drinking?: No 10. Has a relative or friend or a doctor or another health worker been concerned about your drinking or suggested you cut down?: No Alcohol Use Disorder Identification Test Final Score (AUDIT): 7 Intervention/Follow-up: Alcohol Education Substance Abuse History in the last 12 months:  Yes.   Consequences of Substance Abuse: Negative Previous Psychotropic Medications: Yes  Psychological Evaluations: No  Past Medical History:  Past Medical History:  Diagnosis Date  . Sciatica    History reviewed. No pertinent surgical history. Family History: History reviewed. No pertinent family history.  Tobacco Screening: Have you used any form of tobacco in the last 30 days? (Cigarettes, Smokeless Tobacco, Cigars, and/or Pipes): Yes Tobacco use, Select all that apply: 5 or more cigarettes per day Are you interested in Tobacco Cessation Medications?: Yes, will notify MD for an order Counseled patient on smoking cessation including recognizing danger situations, developing coping skills and basic information about quitting provided: Yes Social History:  Social History   Substance and Sexual Activity  Alcohol Use Yes   Comment: "rarely"     Social History   Substance and Sexual Activity  Drug Use No    Additional Social History: Marital status: Single Are you sexually active?: No What is your sexual orientation?: Heterosexual Has your sexual activity been affected by drugs, alcohol, medication, or emotional stress?: No Does patient have children?: No                         Allergies:  No Known Allergies Lab Results:  Results for orders placed or performed during the hospital encounter of 04/06/18 (from the past 48 hour(s))  Comprehensive metabolic panel     Status: Abnormal   Collection Time: 04/06/18  6:48 AM  Result Value Ref Range   Sodium 140 135 - 145 mmol/L   Potassium 3.5 3.5 -  5.1 mmol/L   Chloride 110 101 - 111 mmol/L   CO2 22 22 - 32 mmol/L   Glucose, Bld 106 (H) 65 - 99 mg/dL   BUN 8 6 - 20 mg/dL   Creatinine, Ser 0.94 0.61 - 1.24 mg/dL   Calcium 8.4 (L) 8.9 - 10.3 mg/dL   Total Protein 6.3 (L) 6.5 - 8.1 g/dL   Albumin 3.6 3.5 - 5.0 g/dL   AST 17 15 - 41 U/L   ALT 11 (L) 17 - 63 U/L   Alkaline Phosphatase 49 38 - 126 U/L   Total Bilirubin 0.5 0.3 - 1.2 mg/dL   GFR calc non Af Amer >60 >60 mL/min   GFR calc Af Amer >60 >60 mL/min    Comment: (NOTE) The eGFR has been calculated using the CKD EPI equation. This calculation has not been validated in all clinical situations. eGFR's persistently <60 mL/min signify possible Chronic Kidney Disease.    Anion gap 8 5 - 15    Comment: Performed at [facility], [address] [place], [place] [address]  Lipid  panel     Status: Abnormal   Collection Time: 04/06/18  6:48 AM  Result Value Ref Range   Cholesterol 139 0 - 200 mg/dL   Triglycerides 146 <150 mg/dL   HDL 37 (L) >40 mg/dL   Total CHOL/HDL Ratio 3.8 RATIO   VLDL 29 0 - 40 mg/dL   LDL Cholesterol 73 0 - 99 mg/dL    Comment:        Total Cholesterol/HDL:CHD Risk Coronary Heart Disease Risk Table                     Men   Women  1/2 Average Risk   3.4   3.3  Average Risk       5.0   4.4  2 X Average Risk   9.6   7.1  3 X Average Risk  23.4   11.0        Use the calculated Patient Ratio above and the CHD Risk Table to determine the patient's CHD Risk.        ATP [name] CLASSIFICATION (LDL):  <100     mg/dL   Optimal  100-129  mg/dL   Near or Above                    Optimal  130-159  mg/dL   Borderline  160-189  mg/dL   High  >190     mg/dL   Very High Performed at [facility], [address] [place], [place] [address]   TSH     Status: None   Collection Time: 04/06/18  6:48 AM  Result Value Ref Range   TSH 0.679 0.350 - 4.500 uIU/mL    Comment: Performed by a 3rd Generation assay with a functional sensitivity of  <=0.01 uIU/mL. Performed at [facility], [address] [place], [place] [address]     Blood Alcohol level:  Lab Results  Component Value Date   ETH <10 04/04/2018   [facility]  12/21/2008    <5        LOWEST DETECTABLE LIMIT FOR SERUM ALCOHOL IS 5 mg/dL FOR MEDICAL PURPOSES ONLY    Metabolic Disorder Labs:  No results found for: HGBA1C, MPG No results found for: PROLACTIN Lab Results  Component Value Date   CHOL 139 04/06/2018   TRIG 146 04/06/2018   HDL 37 (L) 04/06/2018   CHOLHDL 3.8 04/06/2018   VLDL 29 04/06/2018   LDLCALC 73 04/06/2018    Current Medications: Current Facility-Administered Medications  Medication Dose Route Frequency Provider Last Rate Last Dose  . acetaminophen (TYLENOL) tablet 650 mg  650 mg Oral Q4H PRN Lenward Chancellor, MD   650 mg at 04/06/18 0824  . alum & mag hydroxide-simeth (MAALOX/MYLANTA) 200-200-20 MG/5ML suspension 30 mL  30 mL Oral Q4H PRN Lenward Chancellor, MD      . hydrOXYzine (ATARAX/VISTARIL) tablet 50 mg  50 mg Oral TID PRN Lenward Chancellor, MD   50 mg at 04/06/18 1210  . magnesium hydroxide (MILK OF MAGNESIA) suspension 30 mL  30 mL Oral Daily PRN Lenward Chancellor, MD      . nicotine (NICODERM CQ - dosed in mg/24 hours) patch 21 mg  21 mg Transdermal Daily Lenward Chancellor, MD   21 mg at 04/06/18 9753  . ondansetron (ZOFRAN-ODT) disintegrating tablet 4 mg  4 mg Oral QID PRN Lenward Chancellor, MD      . traZODone (DESYREL) tablet 50 mg  50 mg Oral QHS PRN  Lenward Chancellor, MD       PTA Medications: Medications Prior to Admission  Medication Sig Dispense Refill Last Dose  . acetaminophen-codeine (TYLENOL #3) 300-30 MG tablet Take 1 tablet by mouth every 4 (four) hours as needed for moderate pain. 20 tablet 0     Musculoskeletal: Strength & Muscle Tone: within normal limits Gait & Station: normal Patient leans: N/A  Psychiatric Specialty Exam: Physical Exam  Nursing note and vitals reviewed. Constitutional: He is  oriented to person, place, and time. He appears well-developed and well-nourished.  HENT:  Head: Normocephalic and atraumatic.  Eyes: Pupils are equal, round, and reactive to light. Conjunctivae and EOM are normal.  Neck: Normal range of motion. Neck supple.  Cardiovascular: Normal rate, regular rhythm and normal heart sounds.  Respiratory: Effort normal and breath sounds normal.  GI: Soft. Bowel sounds are normal.  Musculoskeletal: Normal range of motion.  Neurological: He is alert and oriented to person, place, and time.  Skin: Skin is warm and dry.  Psychiatric: His speech is normal and behavior is normal. His mood appears anxious. His affect is blunt. Thought content is paranoid. Cognition and memory are normal. He expresses impulsivity. He exhibits a depressed mood. He expresses suicidal ideation. He expresses suicidal plans.    Review of Systems  Gastrointestinal: Vomiting:     Neurological: Negative.   Psychiatric/Behavioral: Positive for depression, substance abuse and suicidal ideas. The patient is nervous/anxious and has insomnia.   All other systems reviewed and are negative.   Blood pressure (!) 122/95, pulse 81, temperature 97.7 F (36.5 C), temperature source Oral, resp. rate 18, height 5' 6"  (1.676 m), weight 77.1 kg (170 lb), SpO2 100 %.Body mass index is 27.44 kg/m.  See SRA                                                  Sleep:       Treatment Plan Summary: Daily contact with patient to assess and evaluate symptoms and progress in treatment and Medication management   Mr. Grunewald is a 37 year old male with a history of depression and substance abuse admitted for suicidal ideation with a plan in the context of substance use and severe social stressors.   #Suicidal ideation -patient able to contract for safety in the hos[pital  #Mood/anxiety -start Tegretol 200 mg BID -start Luvox for OCD -start Risperdal for  augmentation  #Insomnia -Trazodone 100 mg night6ly  #Substance abuse -positive for cocaine, cannabis, benzodiazepine, admits to heroine as well, no alcohol -symptomatic treatment for opioid withdrawal  #Labs -lipid panel, TSH and A1C -EKG  #Shoulder pain -Xray negative -Motrin 600 mg PRN  #Substance abuse treatment -negotiates with [facility] and [facility]  #Disposition -TBE   Observation Level/Precautions:  15 minute checks  Laboratory:  CBC Chemistry Profile UDS UA  Psychotherapy:    Medications:    Consultations:    Discharge Concerns:    Estimated LOS:  Other:     Physician Treatment Plan for Primary Diagnosis: Major depressive disorder, recurrent severe without psychotic features ([place]) Long Term Goal(s): Improvement in symptoms so as ready for discharge  Short Term Goals: Ability to identify changes in lifestyle to reduce recurrence of condition will improve, Ability to verbalize feelings will improve, Ability to disclose and discuss suicidal ideas, Ability to demonstrate self-control will improve, Ability to identify and develop  effective coping behaviors will improve, Ability to maintain clinical measurements within normal limits will improve, Compliance with prescribed medications will improve and Ability to identify triggers associated with substance abuse/mental health issues will improve  Physician Treatment Plan for Secondary Diagnosis: Principal Problem:   Major depressive disorder, recurrent severe without psychotic features ([place]) Active Problems:   Cannabis use disorder, moderate, dependence (HCC)   Cocaine use disorder, moderate, dependence (HCC)   Sedative, hypnotic or anxiolytic use disorder, severe, dependence ([place])   Tobacco use disorder  Long Term Goal(s): Improvement in symptoms so as ready for discharge  Short Term Goals: Ability to identify changes in lifestyle to reduce recurrence of condition will improve, Ability to demonstrate  self-control will improve and Ability to identify triggers associated with substance abuse/mental health issues will improve  I certify that inpatient services furnished can reasonably be expected to improve the patient's condition.    Orson Slick, MD 6/17/20191:19 PM

## 2018-04-06 NOTE — [facility] Group Notes (Signed)
LCSW Group Note  04/06/2018  Time: 1PM   Type of Therapy and Topic:  Group Therapy:  Overcoming Obstacles   Participation Level:  Active   Description of Group:   In this group patients will be encouraged to explore what they see as obstacles to their own wellness and recovery. They will be guided to discuss their thoughts, feelings, and behaviors related to these obstacles. The group will process together ways to cope with barriers, with attention given to specific choices patients can make. Each patient will be challenged to identify changes they are motivated to make in order to overcome their obstacles. This group will be process-oriented, with patients participating in exploration of their own experiences, giving and receiving support, and processing challenge from other group members.   Therapeutic Goals: 1. Patient will identify personal and current obstacles as they relate to admission. 2. Patient will identify barriers that currently interfere with their wellness or overcoming obstacles.  3. Patient will identify feelings, thought process and behaviors related to these barriers. 4. Patient will identify two changes they are willing to make to overcome these obstacles:      Summary of Patient Progress  Pt continues to work towards their tx goals but has not yet reached them. Pt was able to appropriately participate in group discussion, and was able to offer support/validation to other group members. Pt reported his short term obstacle is "getting my mind right," and his long term obstacle is, "my depression and drug use." Pt reported the way he intends to manage his obstacle is to, "get treatment and do things differently."    Therapeutic Modalities:   Cognitive Behavioral Therapy Solution Focused Therapy Motivational Interviewing Relapse Prevention Therapy  Heidi DachKelsey Zain Lankford, MSW, LCSW Clinical Social Worker 04/06/2018 1:51 PM

## 2018-04-06 NOTE — Plan of Care (Signed)
Pt has been mostly isolative to his room all evening. Pt denies SI/HI.  Pt is receptive to treatment and safety maintained on unit.  Problem: Education: Goal: Ability to make informed decisions regarding treatment will improve Outcome: Progressing   Problem: Coping: Goal: Coping ability will improve Outcome: Progressing   Problem: Self-Concept: Goal: Ability to disclose and discuss suicidal ideas will improve Outcome: Progressing Goal: Will verbalize positive feelings about self Outcome: Progressing   Problem: Safety: Goal: Ability to remain free from injury will improve Outcome: Progressing   Problem: Coping: Goal: Demonstration of participation in decision-making regarding own care will improve Outcome: Progressing Goal: Ability to use eye contact when communicating with others will improve Outcome: Progressing

## 2018-04-06 NOTE — Tx Team (Addendum)
yInterdisciplinary Treatment and Diagnostic Plan Update  04/06/2018 Time of Session: 10:30am Michael HewRobert Jackson Domino [name] MRN: [id number]  Principal Diagnosis: Major depressive disorder, recurrent severe without psychotic features (HCC)  Secondary Diagnoses: Principal Problem:   Major depressive disorder, recurrent severe without psychotic features (HCC) Active Problems:   Cannabis use disorder, moderate, dependence (HCC)   Cocaine use disorder, moderate, dependence (HCC)   Sedative, hypnotic or anxiolytic use disorder, severe, dependence (HCC)   Tobacco use disorder   Current Medications:  Current Facility-Administered Medications  Medication Dose Route Frequency Provider Last Rate Last Dose  . acetaminophen (TYLENOL) tablet 650 mg  650 mg Oral Q4H PRN Beverly SessionsSubedi, Jagannath, MD   650 mg at 04/06/18 0824  . alum & mag hydroxide-simeth (MAALOX/MYLANTA) 200-200-20 MG/5ML suspension 30 mL  30 mL Oral Q4H PRN Beverly SessionsSubedi, Jagannath, MD      . hydrOXYzine (ATARAX/VISTARIL) tablet 50 mg  50 mg Oral TID PRN Beverly SessionsSubedi, Jagannath, MD      . magnesium hydroxide (MILK OF MAGNESIA) suspension 30 mL  30 mL Oral Daily PRN Beverly SessionsSubedi, Jagannath, MD      . nicotine (NICODERM CQ - dosed in mg/24 hours) patch 21 mg  21 mg Transdermal Daily Beverly SessionsSubedi, Jagannath, MD   21 mg at 04/06/18 [id number]  . ondansetron (ZOFRAN-ODT) disintegrating tablet 4 mg  4 mg Oral QID PRN Beverly SessionsSubedi, Jagannath, MD      . traZODone (DESYREL) tablet 50 mg  50 mg Oral QHS PRN Beverly SessionsSubedi, Jagannath, MD       PTA Medications: Medications Prior to Admission  Medication Sig Dispense Refill Last Dose  . acetaminophen-codeine (TYLENOL #3) 300-30 MG tablet Take 1 tablet by mouth every 4 (four) hours as needed for moderate pain. 20 tablet 0     Patient Stressors: Financial difficulties Occupational concerns Substance abuse  Patient Strengths: Average or above average intelligence Capable of independent living Barrister's clerkCommunication skills Motivation for  treatment/growth Physical Health  Treatment Modalities: Medication Management, Group therapy, Case management,  1 to 1 session with clinician, Psychoeducation, Recreational therapy.   Physician Treatment Plan for Primary Diagnosis: Major depressive disorder, recurrent severe without psychotic features (HCC) Long Term Goal(s):     Short Term Goals:    Medication Management: Evaluate patient's response, side effects, and tolerance of medication regimen.  Therapeutic Interventions: 1 to 1 sessions, Unit Group sessions and Medication administration.  Evaluation of Outcomes: Progressing  Physician Treatment Plan for Secondary Diagnosis: Principal Problem:   Major depressive disorder, recurrent severe without psychotic features (HCC) Active Problems:   Cannabis use disorder, moderate, dependence (HCC)   Cocaine use disorder, moderate, dependence (HCC)   Sedative, hypnotic or anxiolytic use disorder, severe, dependence (HCC)   Tobacco use disorder  Long Term Goal(s):     Short Term Goals:       Medication Management: Evaluate patient's response, side effects, and tolerance of medication regimen.  Therapeutic Interventions: 1 to 1 sessions, Unit Group sessions and Medication administration.  Evaluation of Outcomes: Progressing   RN Treatment Plan for Primary Diagnosis: Major depressive disorder, recurrent severe without psychotic features (HCC) Long Term Goal(s): Knowledge of disease and therapeutic regimen to maintain health will improve  Short Term Goals: Ability to verbalize feelings will improve, Ability to identify and develop effective coping behaviors will improve and Compliance with prescribed medications will improve  Medication Management: RN will administer medications as ordered by provider, will assess and evaluate patient's response and provide education to patient for prescribed medication. RN will report any adverse and/or side  effects to prescribing  provider.  Therapeutic Interventions: 1 on 1 counseling sessions, Psychoeducation, Medication administration, Evaluate responses to treatment, Monitor vital signs and CBGs as ordered, Perform/monitor CIWA, COWS, AIMS and Fall Risk screenings as ordered, Perform wound care treatments as ordered.  Evaluation of Outcomes: Progressing   LCSW Treatment Plan for Primary Diagnosis: Major depressive disorder, recurrent severe without psychotic features (HCC) Long Term Goal(s): Safe transition to appropriate next level of care at discharge, Engage patient in therapeutic group addressing interpersonal concerns.  Short Term Goals: Engage patient in aftercare planning with referrals and resources, Increase social support, Facilitate patient progression through stages of change regarding substance use diagnoses and concerns, Identify triggers associated with mental health/substance abuse issues and Increase skills for wellness and recovery  Therapeutic Interventions: Assess for all discharge needs, 1 to 1 time with Social worker, Explore available resources and support systems, Assess for adequacy in community support network, Educate family and significant other(s) on suicide prevention, Complete Psychosocial Assessment, Interpersonal group therapy.  Evaluation of Outcomes: Progressing   Progress in Treatment: Attending groups: Yes. Participating in groups: Yes. Taking medication as prescribed: Yes. Toleration medication: Yes. Family/Significant other contact made: No, will contact:  Patient refused Patient understands diagnosis: Yes. Discussing patient identified problems/goals with staff: Yes. Medical problems stabilized or resolved: Yes. Denies suicidal/homicidal ideation: No. Issues/concerns per patient self-inventory: No. Other:   New problem(s) identified: No, Describe:  None  New Short Term/Long Term Goal(s): "To get better."  Patient Goals:  "To get better."  Discharge Plan or  Barriers: To enter into a residential rehab program.  Reason for Continuation of Hospitalization: Depression Medication stabilization Suicidal ideation  Estimated Length of Stay: 3-5 days  Recreational Therapy: Patient Stressors: N/A  Patient Goal: Patient will identify 3 triggers for substance use within 5 recreation therapy group sessions  Attendees: Patient: Michael [name] 04/06/2018 11:57 AM  Physician: Dr. Jennet Maduro, MD 04/06/2018 11:57 AM  Nursing: Hulan Amato, RN 04/06/2018 11:57 AM  RN Care Manager: 04/06/2018 11:57 AM  Social Worker: Johny Shears, LCSWA 04/06/2018 11:57 AM  Recreational Therapist: Danella Deis. Dreama Saa, LRT 04/06/2018 11:57 AM  Other: Heidi Dach, LCSW 04/06/2018 11:57 AM  Other: Damian Leavell, Chaplin 04/06/2018 11:57 AM  Other: Unk Pinto, RHA 04/06/2018 11:57 AM    Scribe for Treatment Team: Johny Shears, LCSW 04/06/2018 11:57 AM

## 2018-04-07 MED ORDER — TRAZODONE HCL 100 MG PO TABS
100.0000 mg | ORAL_TABLET | Freq: Every evening | ORAL | Status: DC | PRN
  Administered 2018-04-08: 100 mg via ORAL
  Filled 2018-04-07: qty 1

## 2018-04-07 MED ORDER — FLUVOXAMINE MALEATE 50 MG PO TABS
100.0000 mg | ORAL_TABLET | Freq: Every day | ORAL | Status: DC
  Administered 2018-04-07 – 2018-04-11 (×5): 100 mg via ORAL
  Filled 2018-04-07 (×5): qty 2

## 2018-04-07 NOTE — Progress Notes (Signed)
[facility] MD Progress Note  04/07/2018 11:52 AM Michael [name]  MRN:  [id number]  Subjective:    Michael [name] had a phone interview with [facility] yesterday. He does not believe that this is a good Orthoptist. He was toled that he can only take aspirin there. He is unsure if he can participate in religious part of Michael program honestly. Together, we called [facility] program yesterday. Unfortunately, they do not have any beds but were ready to put Michael [name] on their wait list.   He has been asleep all morning long, reports no symptoms of withdrawal or side effects from medications. No somatic complaints, accepts medications. No program participation.  Principal Problem: Major depressive disorder, recurrent severe without psychotic features ([place]) Diagnosis:   [name] Active Problem List   Diagnosis Date Noted  . Major depressive disorder, recurrent severe without psychotic features ([place]) [F33.2] 04/06/2018    Priority: High  . Cannabis use disorder, moderate, dependence ([place]) [F12.20] 04/06/2018  . Cocaine use disorder, moderate, dependence ([place]) [F14.20] 04/06/2018  . Sedative, hypnotic or anxiolytic use disorder, severe, dependence ([place]) [F13.20] 04/06/2018  . Tobacco use disorder [F17.200] 04/06/2018   Total Time spent with [name]: 20 minutes  Past Psychiatric History: depression, substance abuse  Past Medical History:  Past Medical History:  Diagnosis Date  . Sciatica    History reviewed. No pertinent surgical history. Family History: History reviewed. No pertinent family history. Family Psychiatric  History: none Social History:  Social History   Substance and Sexual Activity  Alcohol Use Yes   Comment: "rarely"     Social History   Substance and Sexual Activity  Drug Use No    Social History   Socioeconomic History  . Marital status: Single    Spouse name: Not on file  . Number of children: Not on file  . Years of education: Not on file  . Highest education  level: Not on file  Occupational History  . Not on file  Social Needs  . Financial resource strain: Not on file  . Food insecurity:    Worry: Not on file    Inability: Not on file  . Transportation needs:    Medical: Not on file    Non-medical: Not on file  Tobacco Use  . Smoking status: Current Every Day Smoker    Packs/day: 0.50    Types: Cigarettes  . Smokeless tobacco: Never Used  Substance and Sexual Activity  . Alcohol use: Yes    Comment: "rarely"  . Drug use: No  . Sexual activity: Not on file  Lifestyle  . Physical activity:    Days per week: Not on file    Minutes per session: Not on file  . Stress: Not on file  Relationships  . Social connections:    Talks on phone: Not on file    Gets together: Not on file    Attends religious service: Not on file    Active member of club or organization: Not on file    Attends meetings of clubs or organizations: Not on file    Relationship status: Not on file  Other Topics Concern  . Not on file  Social History Narrative  . Not on file   Additional Social History:                         Sleep: Fair  Appetite:  Fair  Current Medications: Current Facility-Administered Medications  Medication Dose Route Frequency Provider  Last Rate Last Dose  . acetaminophen (TYLENOL) tablet 650 mg  650 mg Oral Q4H PRN Lenward Chancellor, MD   650 mg at 04/07/18 0830  . alum & mag hydroxide-simeth (MAALOX/MYLANTA) 200-200-20 MG/5ML suspension 30 mL  30 mL Oral Q4H PRN Lenward Chancellor, MD      . cloNIDine (CATAPRES) tablet 0.1 mg  0.1 mg Oral TID Zaion Hreha B, MD   0.1 mg at 04/07/18 0815  . fluvoxaMINE (LUVOX) tablet 50 mg  50 mg Oral QHS Ceaser Ebeling B, MD   50 mg at 04/06/18 2129  . hydrOXYzine (ATARAX/VISTARIL) tablet 50 mg  50 mg Oral TID PRN Lenward Chancellor, MD   50 mg at 04/06/18 1210  . magnesium hydroxide (MILK OF MAGNESIA) suspension 30 mL  30 mL Oral Daily PRN Lenward Chancellor, MD      .  nicotine (NICODERM CQ - dosed in mg/24 hours) patch 21 mg  21 mg Transdermal Daily Lenward Chancellor, MD   21 mg at 04/07/18 0815  . ondansetron (ZOFRAN-ODT) disintegrating tablet 4 mg  4 mg Oral QID PRN Lenward Chancellor, MD      . risperiDONE (RISPERDAL) tablet 2 mg  2 mg Oral BID Karely Hurtado B, MD   2 mg at 04/07/18 0815  . traZODone (DESYREL) tablet 100 mg  100 mg Oral QHS Treyvon Blahut B, MD   100 mg at 04/06/18 2129    Lab Results:  Results for orders placed or performed during Michael hospital encounter of 04/06/18 (from Michael past 48 hour(s))  Comprehensive metabolic panel     Status: Abnormal   Collection Time: 04/06/18  6:48 AM  Result Value Ref Range   Sodium 140 135 - 145 mmol/L   Potassium 3.5 3.5 - 5.1 mmol/L   Chloride 110 101 - 111 mmol/L   CO2 22 22 - 32 mmol/L   Glucose, Bld 106 (H) 65 - 99 mg/dL   BUN 8 6 - 20 mg/dL   Creatinine, Ser 0.94 0.61 - 1.24 mg/dL   Calcium 8.4 (L) 8.9 - 10.3 mg/dL   Total Protein 6.3 (L) 6.5 - 8.1 g/dL   Albumin 3.6 3.5 - 5.0 g/dL   AST 17 15 - 41 U/L   ALT 11 (L) 17 - 63 U/L   Alkaline Phosphatase 49 38 - 126 U/L   Total Bilirubin 0.5 0.3 - 1.2 mg/dL   GFR calc non Af Amer >60 >60 mL/min   GFR calc Af Amer >60 >60 mL/min    Comment: (NOTE) Michael eGFR has been calculated using Michael CKD EPI equation. This calculation has not been validated in all clinical situations. eGFR's persistently <60 mL/min signify possible Chronic Kidney Disease.    Anion gap 8 5 - 15    Comment: Performed at [facility], [address] [place], [place] [address]  Lipid panel     Status: Abnormal   Collection Time: 04/06/18  6:48 AM  Result Value Ref Range   Cholesterol 139 0 - 200 mg/dL   Triglycerides 146 <150 mg/dL   HDL 37 (L) >40 mg/dL   Total CHOL/HDL Ratio 3.8 RATIO   VLDL 29 0 - 40 mg/dL   LDL Cholesterol 73 0 - 99 mg/dL    Comment:        Total Cholesterol/HDL:CHD Risk Coronary Heart Disease Risk Table                     Men    Women  1/2 Average Risk  3.4   3.3  Average Risk       5.0   4.4  2 X Average Risk   9.6   7.1  3 X Average Risk  23.4   11.0        Use Michael calculated [name] Ratio above and Michael CHD Risk Table to determine Michael [name]'s CHD Risk.        ATP [name] CLASSIFICATION (LDL):  <100     mg/dL   Optimal  100-129  mg/dL   Near or Above                    Optimal  130-159  mg/dL   Borderline  160-189  mg/dL   High  >190     mg/dL   Very High Performed at [facility], [address] [place], [place] [address]   TSH     Status: None   Collection Time: 04/06/18  6:48 AM  Result Value Ref Range   TSH 0.679 0.350 - 4.500 uIU/mL    Comment: Performed by a 3rd Generation assay with a functional sensitivity of <=0.01 uIU/mL. Performed at [facility], [address] [place], [place] [address]     Blood Alcohol level:  Lab Results  Component Value Date   ETH <10 04/04/2018   [facility]  12/21/2008    <5        LOWEST DETECTABLE LIMIT FOR SERUM ALCOHOL IS 5 mg/dL FOR MEDICAL PURPOSES ONLY    Metabolic Disorder Labs: No results found for: HGBA1C, MPG No results found for: PROLACTIN Lab Results  Component Value Date   CHOL 139 04/06/2018   TRIG 146 04/06/2018   HDL 37 (L) 04/06/2018   CHOLHDL 3.8 04/06/2018   VLDL 29 04/06/2018   LDLCALC 73 04/06/2018    Physical Findings: AIMS: Facial and Oral Movements Muscles of Facial Expression: None, normal Lips and Perioral Area: None, normal Jaw: None, normal Tongue: None, normal,Extremity Movements Upper (arms, wrists, hands, fingers): None, normal Lower (legs, knees, ankles, toes): None, normal, Trunk Movements Neck, shoulders, hips: None, normal, Overall Severity Severity of abnormal movements (highest score from questions above): None, normal Incapacitation due to abnormal movements: None, normal [name]'s awareness of abnormal movements (rate only [name]'s report): No Awareness, Dental Status Current problems  with teeth and/or dentures?: No Does [name] usually wear dentures?: No  CIWA:  CIWA-Ar Total: 2 COWS:  COWS Total Score: 0  Musculoskeletal: Strength & Muscle Tone: within normal limits Gait & Station: normal [name] leans: N/A  Psychiatric Specialty Exam: Physical Exam  Nursing note and vitals reviewed. Psychiatric: His speech is normal. Thought content normal. His affect is blunt. He is slowed and withdrawn. Cognition and memory are normal. He expresses impulsivity.    Review of Systems  Neurological: Negative.   Psychiatric/Behavioral: Positive for depression, substance abuse and suicidal ideas.  All other systems reviewed and are negative.   Blood pressure (!) 129/106, pulse (!) 102, temperature 97.7 F (36.5 C), temperature source Oral, resp. rate 18, height 5' 6"  (1.676 m), weight 77.1 kg (170 lb), SpO2 100 %.Body mass index is 27.44 kg/m.  General Appearance: Fairly Groomed  Eye Contact:  Good  Speech:  Clear and Coherent  Volume:  Normal  Mood:  Depressed and Irritable  Affect:  Congruent  Thought Process:  Goal Directed and Descriptions of Associations: Intact  Orientation:  Full (Time, Place, and Person)  Thought Content:  WDL  Suicidal Thoughts:  Yes.  without intent/plan  Homicidal Thoughts:  No  Memory:  Immediate;   Fair Recent;   Fair Remote;   Fair  Judgement:  Poor  Insight:  Lacking  Psychomotor Activity:  Psychomotor Retardation  Concentration:  Concentration: Fair and Attention Span: Fair  Recall:  [employer] of Knowledge:  Fair  Language:  Fair  Akathisia:  No  Handed:  Right  AIMS (if indicated):     Assets:  Communication Skills Desire for Improvement Physical Health Resilience  ADL's:  Intact  Cognition:  WNL  Sleep:        Treatment Plan Summary: Daily contact with [name] to assess and evaluate symptoms and progress in treatment and Medication management   Michael [name] is a 37 year old male with a history of depression and substance  abuse admitted for suicidal ideation with a plan in Michael context of substance use and severe social stressors.   #Suicidal ideation -[name] able to contract for safety in Michael hos[pital  #Mood/anxiety -increase Luvox to 100 mg nightly -continue Risperdal 2 mg BID  #Insomnia, improved -change Trazodone to PRN   #Substance abuse -positive for cocaine, cannabis, benzodiazepine, admits to heroine as well, no alcohol -symptomatic treatment for opioid withdrawal -Clonidine 0.0 mg TID  #Labs -lipid panel, TSH and A1C are normal -EKG reviewed, NSR with QTc 434  #Shoulder pain -Xray negative -Motrin 600 mg PRN  #Substance abuse treatment -on wait list at [facility] -would benefit from [facility] referral   #Disposition -TBE     Orson Slick, MD 04/07/2018, 11:52 AM

## 2018-04-07 NOTE — [facility] Group Notes (Signed)
04/07/2018 1PM  Type of Therapy/Topic:  Group Therapy:  Feelings about Diagnosis  Participation Level:  Did Not Attend   Description of Group:   This group will allow patients to explore their thoughts and feelings about diagnoses they have received. Patients will be guided to explore their level of understanding and acceptance of these diagnoses. Facilitator will encourage patients to process their thoughts and feelings about the reactions of others to their diagnosis and will guide patients in identifying ways to discuss their diagnosis with significant others in their lives. This group will be process-oriented, with patients participating in exploration of their own experiences, giving and receiving support, and processing challenge from other group members.   Therapeutic Goals: 1. Patient will demonstrate understanding of diagnosis as evidenced by identifying two or more symptoms of the disorder 2. Patient will be able to express two feelings regarding the diagnosis 3. Patient will demonstrate their ability to communicate their needs through discussion and/or role play  Summary of Patient Progress: Patient was encouraged and invited to attend group. Patient did not attend group. Social worker will continue to encourage group participation in the future.        Therapeutic Modalities:   Cognitive Behavioral Therapy Brief Therapy Feelings Identification    Johny ShearsCassandra  Melinna Linarez, LCSW 04/07/2018 2:08 PM

## 2018-04-07 NOTE — [facility] Group Notes (Signed)
[facility] Group Notes:  (Nursing/MHT/Case Management/Adjunct)  Date:  04/07/2018  Time:  9:18 PM  Type of Therapy:  Group Therapy  Participation Level:  Active  Participation Quality:  Appropriate and Sharing  Affect:  Appropriate  Cognitive:  Appropriate  Insight:  Appropriate  Engagement in Group:  Engaged  Modes of Intervention:  Discussion  Summary of Progress/Problems: Molly MaduroRobert reported he accomplished his goal for the day. Shared with group, no other discussion or comments afterwards. Jinger NeighborsKeith D Letricia Krinsky 04/07/2018, 9:18 PM

## 2018-04-07 NOTE — Plan of Care (Signed)
Pt out in milieu this evening for group and was out of room a good portion of the evening. Pt denies SI/HI. Pt complained of shoulder pain and did receive 650 tylenol for it. Pt compliant with medications. Pt is receptive to treatment and safety maintained on unit. Will continue to monitor. Problem: Education: Goal: Ability to make informed decisions regarding treatment will improve Outcome: Progressing   Problem: Coping: Goal: Coping ability will improve Outcome: Progressing   Problem: Self-Concept: Goal: Ability to disclose and discuss suicidal ideas will improve Outcome: Progressing Goal: Will verbalize positive feelings about self Outcome: Progressing   Problem: Safety: Goal: Ability to remain free from injury will improve Outcome: Progressing   Problem: Coping: Goal: Demonstration of participation in decision-making regarding own care will improve Outcome: Progressing Goal: Ability to use eye contact when communicating with others will improve Outcome: Progressing

## 2018-04-07 NOTE — Progress Notes (Signed)
D: Patient stated slept good last night .Stated appetite is good and energy level  Is normal. Stated concentration is good . Stated on Depression scale , hopeless and anxiety .( low 0-10 high) Denies suicidal  homicidal ideations  .  No auditory hallucinations  No pain concerns . Appropriate ADL'S. Interacting with peers and staff. voice of feeling better today. A: Encourage patient participation with unit programming . Instruction  Given on  Medication , verbalize understanding. R: Voice no other concerns. Staff continue to monitor

## 2018-04-07 NOTE — [facility] Group Notes (Signed)
CSW Group Therapy Note  04/07/2018  Time:  0900  Type of Therapy and Topic: Group Therapy: Goals Group: SMART Goals    Participation Level:  Did Not Attend    Description of Group:   The purpose of a daily goals group is to assist and guide patients in setting recovery/wellness-related goals. The objective is to set goals as they relate to the crisis in which they were admitted. Patients will be using SMART goal modalities to set measurable goals. Characteristics of realistic goals will be discussed and patients will be assisted in setting and processing how one will reach their goal. Facilitator will also assist patients in applying interventions and coping skills learned in psycho-education groups to the SMART goal and process how one will achieve defined goal.    Therapeutic Goals:  -Patients will develop and document one goal related to or their crisis in which brought them into treatment.  -Patients will be guided by LCSW using SMART goal setting modality in how to set a measurable, attainable, realistic and time sensitive goal.  -Patients will process barriers in reaching goal.  -Patients will process interventions in how to overcome and successful in reaching goal.    Patient's Goal:  Pt was invited to attend group but chose not to attend. CSW will continue to encourage pt to attend group throughout their admission.   Therapeutic Modalities:  Motivational Interviewing  Cognitive Behavioral Therapy  Crisis Intervention Model  SMART goals setting  Heidi DachKelsey Doug Bucklin, MSW, LCSW Clinical Social Worker 04/07/2018 9:34 AM

## 2018-04-07 NOTE — Plan of Care (Signed)
No problems with elimination of bowels .Patient  makings decisions regarding his  treatment  on the unit . Able to talk  [facility] . Stated he didn't  know if he could preform the tasks involved .  Working on Pharmacologistcoping skills . Voice of no safety concerns  . Interacting  with others on the unit   . Maintained  good eye contact . Verbalize understanding of  medication received .    Problem: Education: Goal: Ability to make informed decisions regarding treatment will improve Outcome: Progressing   Problem: Coping: Goal: Coping ability will improve Outcome: Progressing   Problem: Self-Concept: Goal: Ability to disclose and discuss suicidal ideas will improve Outcome: Progressing Goal: Will verbalize positive feelings about self Outcome: Progressing   Problem: Safety: Goal: Ability to remain free from injury will improve Outcome: Progressing   Problem: Coping: Goal: Ability to identify and develop effective coping behavior will improve Outcome: Progressing Goal: Ability to interact with others will improve Outcome: Progressing Goal: Demonstration of participation in decision-making regarding own care will improve Outcome: Progressing Goal: Ability to use eye contact when communicating with others will improve Outcome: Progressing   Problem: Education: Goal: Utilization of techniques to improve thought processes will improve Outcome: Progressing Goal: Knowledge of the prescribed therapeutic regimen will improve Outcome: Progressing

## 2018-04-08 MED ORDER — IBUPROFEN 600 MG PO TABS
600.0000 mg | ORAL_TABLET | Freq: Four times a day (QID) | ORAL | Status: DC | PRN
  Administered 2018-04-09: 600 mg via ORAL
  Filled 2018-04-08: qty 1

## 2018-04-08 MED ORDER — CLONIDINE HCL 0.1 MG PO TABS
0.1000 mg | ORAL_TABLET | Freq: Two times a day (BID) | ORAL | Status: DC
  Administered 2018-04-08 – 2018-04-10 (×4): 0.1 mg via ORAL
  Filled 2018-04-08 (×4): qty 1

## 2018-04-08 NOTE — [facility] Counselor (Signed)
CSW received authorization from [employer]. CSW faxed over referral to [facility].    Johny Shearsassandra Ashten Prats, MSW, Theresia MajorsLCSWA, Bridget HartshornLCASA Clinical Social Worker 04/08/2018 3:15 PM

## 2018-04-08 NOTE — Plan of Care (Signed)
D: Pt denies SI/HI/AVH. Pt is pleasant and cooperative. Pt stated he was having withdrawal Sx this evening , pt was given PRN Vistaril with his night medications per MAR . Pt stated he feels better sometimes and worse others. Pt main focus was on his Sx . Pt encouraged to work on coping skills and to work on the next place he will be going for detox.   A: Pt was offered support and encouragement. Pt was given scheduled medications. Pt was encourage to attend groups. Q 15 minute checks were done for safety.   R:Pt attends groups and interacts well with peers and staff. Pt is taking medication. Pt receptive to treatment and safety maintained on unit.   Problem: Coping: Goal: Coping ability will improve Outcome: Progressing   Problem: Self-Concept: Goal: Ability to disclose and discuss suicidal ideas will improve Outcome: Progressing   Problem: Self-Concept: Goal: Will verbalize positive feelings about self Outcome: Progressing   Problem: Safety: Goal: Ability to remain free from injury will improve Outcome: Progressing   Problem: Coping: Goal: Ability to interact with others will improve Outcome: Progressing   Problem: Coping: Goal: Ability to use eye contact when communicating with others will improve Outcome: Progressing

## 2018-04-08 NOTE — Progress Notes (Signed)
Recreation Therapy Notes  INPATIENT RECREATION THERAPY ASSESSMENT  Patient Details Name: Michael [name] MRN: [id number] DOB: 02/23/1981 Today's Date: 04/08/2018       Information Obtained From: Patient  Able to Participate in Assessment/Interview: Yes  Patient Presentation: Responsive  Reason for Admission (Per Patient): Active Symptoms, Suicidal Ideation, Substance Abuse  Patient Stressors:    Coping Skills:   Meditate, Deep Breathing, Substance Abuse  Leisure Interests (2+):  Conoco[employer]ill[employer]ature - Fishing, Individual - TV, Sports - Football, Sports - Basketball(Restore old things)  Frequency of Recreation/Participation: Weekly  Awareness of Community Resources:     [employer]Community Resources:     Current Use:    If no, Barriers?:    Expressed Interest in [facility] [employer]Community Resource Information:    [place]  [place]  Patient Main Form of Transportation: Therapist, musicublic Transportation  Patient Strengths:  I have a big heart  Patient Identified Areas of Improvement:  Leave drugs alone  Patient Goal for Hospitalization:  To get better  Current SI (including self-harm):  No  Current HI:  No  Current AVH: No  Staff Intervention Plan: Group Attendance, Collaborate with Interdisciplinary Treatment Team  Consent to Intern Participation: N/A  Tarren Velardi 04/08/2018, 3:09 PM

## 2018-04-08 NOTE — Progress Notes (Signed)
Recreation Therapy Notes   Date: 04/08/2018  Time: 9:30 am  Location: Craft Room  Behavioral response: Appropriate   Intervention Topic: Team Work  Discussion/Intervention:  Group content on today was focused on teamwork. The group identified what teamwork is. Individuals described who is a part of their team. Patients expressed why they thought teamwork is important. The group stated reasons why they thought it was easier to work with a Comptrollersmaller/larger team. Individuals discussed some positives and negatives of working with a team. Patients gave examples of past experiences they had while working with a team. The group participated in the intervention "Story in a bag", patients were in groups and were able to test their skill in a team setting.  Clinical Observations/Feedback:  Patient came to group and defined team work as having one leader and followers. He stated that team work can be difficult because the more minds you have the more problems there are. Individual was social with peers and staff while participating in the intervention in group. Yolanda Huffstetler LRT/CTRS         Endia Moncur 04/08/2018 11:59 AM

## 2018-04-08 NOTE — Plan of Care (Signed)
Verbalize understanding of  medication received .    No problems with elimination of bowels .Patient  makings decisions regarding his  treatment  on the unit . Able to talk  with representative  [facility] . Stated he didn't  know if he could preform the tasks involved .  Working on Pharmacologistcoping skills . Voice of no safety concerns  . Interacting  with others on the unit   . Maintained  good eye contact .  Problem: Education: Goal: Ability to make informed decisions regarding treatment will improve Outcome: Progressing   Problem: Coping: Goal: Coping ability will improve Outcome: Progressing   Problem: Self-Concept: Goal: Ability to disclose and discuss suicidal ideas will improve Outcome: Progressing Goal: Will verbalize positive feelings about self Outcome: Progressing   Problem: Safety: Goal: Ability to remain free from injury will improve Outcome: Progressing   Problem: Coping: Goal: Ability to identify and develop effective coping behavior will improve Outcome: Progressing Goal: Ability to interact with others will improve Outcome: Progressing Goal: Demonstration of participation in decision-making regarding own care will improve Outcome: Progressing Goal: Ability to use eye contact when communicating with others will improve Outcome: Progressing   Problem: Education: Goal: Utilization of techniques to improve thought processes will improve Outcome: Progressing Goal: Knowledge of the prescribed therapeutic regimen will improve Outcome: Progressing

## 2018-04-08 NOTE — Progress Notes (Signed)
Recreation Therapy Notes  Date: 04/06/2018  Time: 9:30 am  Location: Craft Room  Behavioral response: Appropriate   Intervention Topic: Happiness  Discussion/Intervention:  Group content today was focused on Happiness. The group defined happiness and stated reasons they are and are not happy at times. Participants identified reasons they are normally happy and why. Individuals expressed how not being happy affects themselves and others. Patients stated reasons why happiness is important to them. The group described how they feel when they are happy. Individuals participated in the intervention "What is happiness" where they defined what happiness means to them.   Clinical Observations/Feedback:  Patient came to group and identified money as something that makes him happy. He was social with peers and staff while participating in the intervention during group. Nohlan Burdin LRT/CTRS            Shakeema Lippman 04/08/2018 11:25 AM

## 2018-04-08 NOTE — [facility] Group Notes (Signed)
04/08/2018 1PM  Type of Therapy/Topic:  Group Therapy:  Emotion Regulation  Participation Level:  Did Not Attend   Description of Group:   The purpose of this group is to assist patients in learning to regulate negative emotions and experience positive emotions. Patients will be guided to discuss ways in which they have been vulnerable to their negative emotions. These vulnerabilities will be juxtaposed with experiences of positive emotions or situations, and patients will be challenged to use positive emotions to combat negative ones. Special emphasis will be placed on coping with negative emotions in conflict situations, and patients will process healthy conflict resolution skills.  Therapeutic Goals: 1. Patient will identify two positive emotions or experiences to reflect on in order to balance out negative emotions 2. Patient will label two or more emotions that they find the most difficult to experience 3. Patient will demonstrate positive conflict resolution skills through discussion and/or role plays  Summary of Patient Progress: Patient was encouraged and invited to attend group. Patient did not attend group. Social worker will continue to encourage group participation in the future.        Therapeutic Modalities:   Cognitive Behavioral Therapy Feelings Identification Dialectical Behavioral Therapy   Johny ShearsCassandra  Tawnia Schirm, LCSW 04/08/2018 2:13 PM

## 2018-04-08 NOTE — [facility] Counselor (Signed)
CSW was informed that [facility] doesn't have any male beds available but the patient can be placed on the waiting list. [facility]  [facility] and will begin working on [facility] referral for the patient.   Johny Shearsassandra Lanea Vankirk, MSW, Theresia MajorsLCSWA, Bridget HartshornLCASA Clinical Social Worker 04/08/2018 9:47 AM

## 2018-04-08 NOTE — Progress Notes (Signed)
D: Patient stated slept good last night .Stated appetite is good and energy level  Is normal. Stated concentration is good . Stated on Depression scale 8, hopeless  5 and anxiety 8 . ( low 0-10 high) Denies suicidal  homicidal ideations  .  No auditory hallucinations  No pain concerns . Appropriate ADL'S. Interacting with peers and staff.  Verbalize understanding of  medication received .    No problems with elimination of bowels .Patient  makings decisions regarding his  treatment  on the unit . Able to talk  with representative  [facility] . Stated he didn't  know if he could preform the tasks involved .  Working on Pharmacologistcoping skills . Voice of no safety concerns  . Interacting  with others on the unit   . Maintained  good eye contact .  Attending unit programs   A: Encourage patient participation with unit programming . Instruction  Given on  Medication , verbalize understanding.  R: Voice no other concerns. Staff continue to monitor

## 2018-04-08 NOTE — [facility] Counselor (Signed)
CSW faxed over [facility] referral to [employer] for an authorization number.  Johny Shearsassandra Kaysan Peixoto, MSW, Theresia MajorsLCSWA, Bridget HartshornLCASA Clinical Social Worker 04/08/2018 2:07 PM

## 2018-04-08 NOTE — [facility] Group Notes (Signed)
[facility] Group Notes:  (Nursing/MHT/Case Management/Adjunct)  Date:  04/08/2018  Time:  9:17 PM  Type of Therapy:  Group Therapy  Participation Level:  Active  Participation Quality:  Appropriate  Affect:  Appropriate  Cognitive:  Appropriate  Insight:  Appropriate  Engagement in Group:  Engaged  Modes of Intervention:  Discussion  Summary of Progress/Problems:  Michael NeighborsKeith [name] [name] 04/08/2018, 9:17 PM

## 2018-04-08 NOTE — Progress Notes (Addendum)
[facility] MD Progress Note  04/08/2018 9:54 AM Michael [name]  MRN:  [id number]  Subjective:    Mr. Parmer is up this morning, not too sedated from Luvox. He still feels depressed but not suicidal. Still interested in substance abuse treatment. He did not [employer] was a fit for him. He was accepted on wait list for [facility]. He still complains of shoulder pain that is addressed with Motrin.  Principal Problem: Major depressive disorder, recurrent severe without psychotic features (HCC) Diagnosis:   Patient Active Problem List   Diagnosis Date Noted  . 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 Cannabis use disorder, moderate, dependence (HCC) [F12.20] 04/06/2018  . Cocaine use disorder, moderate, dependence (HCC) [F14.20] 04/06/2018  . Sedative, hypnotic or anxiolytic use disorder, severe, dependence (HCC) [F13.20] 04/06/2018  . Tobacco use disorder [F17.200] 04/06/2018   Total Time spent with patient: 20 minutes  Past Psychiatric History: depression, mood isnatbility, substance abuse.  Past Medical History:  Past Medical History:  Diagnosis Date  . Sciatica    History reviewed. No pertinent surgical history. Family History: History reviewed. No pertinent family history. Family Psychiatric  History: bipolar. Social History:  Social History   Substance and Sexual Activity  Alcohol Use Yes   Comment: "rarely"     Social History   Substance and Sexual Activity  Drug Use No    Social History   Socioeconomic History  . Marital status: Single    Spouse name: Not on file  . Number of children: Not on file  . Years of education: Not on file  . Highest education level: Not on file  Occupational History  . Not on file  Social Needs  . Financial resource strain: Not on file  . Food insecurity:    Worry: Not on file    Inability: Not on file  . Transportation needs:    Medical: Not on file     Non-medical: Not on file  Tobacco Use  . Smoking status: Current Every Day Smoker    Packs/day: 0.50    Types: Cigarettes  . Smokeless tobacco: Never Used  Substance and Sexual Activity  . Alcohol use: Yes    Comment: "rarely"  . Drug use: No  . Sexual activity: Not on file  Lifestyle  . Physical activity:    Days per week: Not on file    Minutes per session: Not on file  . Stress: Not on file  Relationships  . Social connections:    Talks on phone: Not on file    Gets together: Not on file    Attends religious service: Not on file    Active member of club or organization: Not on file    Attends meetings of clubs or organizations: Not on file    Relationship status: Not on file  Other Topics Concern  . Not on file  Social History Narrative  . Not on file   Additional Social History:                         Sleep: Fair  Appetite:  Fair  Current Medications: Current Facility-Administered Medications  Medication Dose Route Frequency Provider Last Rate Last Dose  . acetaminophen (TYLENOL) tablet 650 mg  650 mg Oral Q4H PRN Beverly Sessions, MD   650 mg at 04/08/18 0917  . alum & mag hydroxide-simeth (MAALOX/MYLANTA) 200-200-20 MG/5ML suspension 30 mL  30 mL Oral Q4H PRN Beverly Sessions, MD      . cloNIDine (CATAPRES) tablet 0.1 mg  0.1 mg Oral TID Azai Gaffin B, MD   0.1 mg at 04/08/18 0759  . fluvoxaMINE (LUVOX) tablet 100 mg  100 mg Oral QHS Shaira Sova B, MD   100 mg at 04/07/18 2139  . hydrOXYzine (ATARAX/VISTARIL) tablet 50 mg  50 mg Oral TID PRN Beverly Sessions, MD   50 mg at 04/08/18 0917  . magnesium hydroxide (MILK OF MAGNESIA) suspension 30 mL  30 mL Oral Daily PRN Beverly Sessions, MD      . nicotine (NICODERM CQ - dosed in mg/24 hours) patch 21 mg  21 mg Transdermal Daily Beverly Sessions, MD   21 mg at 04/08/18 0801  . ondansetron (ZOFRAN-ODT) disintegrating tablet 4 mg  4 mg Oral QID PRN Beverly Sessions, MD      .  risperiDONE (RISPERDAL) tablet 2 mg  2 mg Oral BID Cruzito Standre B, MD   2 mg at 04/08/18 0759  . traZODone (DESYREL) tablet 100 mg  100 mg Oral QHS PRN Russel Morain B, MD        Lab Results: No results found for this or any previous visit (from the past 48 hour(s)).  Blood Alcohol level:  Lab Results  Component Value Date   ETH <10 04/04/2018   ETH  12/21/2008    <5        LOWEST DETECTABLE LIMIT FOR SERUM ALCOHOL IS 5 mg/dL FOR MEDICAL PURPOSES ONLY    Metabolic Disorder Labs: No results found for: HGBA1C, MPG No results found for: PROLACTIN Lab Results  Component Value Date   CHOL 139 04/06/2018   TRIG 146 04/06/2018   HDL 37 (L) 04/06/2018   CHOLHDL 3.8 04/06/2018   VLDL 29 04/06/2018   LDLCALC 73 04/06/2018    Physical Findings: AIMS: Facial and Oral Movements Muscles of Facial Expression: None, normal Lips and Perioral Area: None, normal Jaw: None, normal Tongue: None, normal,Extremity Movements Upper (arms, wrists, hands, fingers): None, normal Lower (legs, knees, ankles, toes): None, normal, Trunk Movements Neck, shoulders, hips: None, normal, Overall Severity Severity of abnormal movements (highest score from questions above): None, normal Incapacitation due to abnormal movements: None, normal Patient's awareness of abnormal movements (rate only patient's report): No Awareness, Dental Status Current problems with teeth and/or dentures?: No Does patient usually wear dentures?: No  CIWA:  CIWA-Ar Total: 2 COWS:  COWS Total Score: 0  Musculoskeletal: Strength & Muscle Tone: within normal limits Gait & Station: normal Patient leans: N/A  Psychiatric Specialty Exam: Physical Exam  Nursing note and vitals reviewed. Psychiatric: Thought content normal. His affect is blunt. His speech is delayed. He is withdrawn. Cognition and memory are normal. He expresses impulsivity.    Review of Systems  Neurological: Negative.   Psychiatric/Behavioral:  Positive for depression and substance abuse.  All other systems reviewed and are negative.   Blood pressure 116/82, pulse (!) 101, temperature 97.8 F (36.6 C), temperature source Oral, resp. rate 18, height 5\' 6"  (1.676 m), weight 77.1 kg (170 lb), SpO2 99 %.Body mass index is 27.44 kg/m.  General Appearance: Casual  Eye Contact:  Good  Speech:  Clear and Coherent  Volume:  Normal  Mood:  Depressed  Affect:  Flat  Thought Process:  Goal Directed and Descriptions of Associations: Intact  Orientation:  Full (Time, Place, and Person)  Thought Content:  WDL  Suicidal Thoughts:  No  Homicidal Thoughts:  No  Memory:  Immediate;   Fair Recent;   Fair Remote;   Fair  Judgement:  Poor  Insight:  Lacking  Psychomotor Activity:  Normal  Concentration:  Concentration: Fair and Attention Span: Fair  Recall:  FiservFair  Fund of Knowledge:  Fair  Language:  Fair  Akathisia:  No  Handed:  Right  AIMS (if indicated):     Assets:  Communication Skills Desire for Improvement Physical Health Resilience  ADL's:  Intact  Cognition:  WNL  Sleep:        Treatment Plan Summary: Daily contact with patient to assess and evaluate symptoms and progress in treatment and Medication management   Mr. Betsey HolidayMathis is a 37 year old male with a history of depression and substance abuse admitted for suicidal ideation with a plan in the context of substance use and severe social stressors.   #Suicidal ideation, resolved -patient able to contract for safety in the hos[pital  #Mood/anxiety, improving -continue Luvox to 100 mg nightly -continue Risperdal 2 mg BID  #Insomnia, improved -Trazodone 100 mg nightly PRN   #Substance abuse, no symptoms of withdrawal -positive for cocaine, cannabis, benzodiazepine, admits to heroine as well, no alcohol -symptomatic treatment for opioid withdrawal -lower Clonidine to 0.1 mg BID  #Labs -lipid panel, TSH and A1C are normal -EKG reviewed, NSR with QTc  434  #Shoulder pain -Xray negative -Motrin 600 mg PRN  #Substance abuse treatment -[facility] -would benefit from [facility] referral   #Disposition -TBE    Kristine LineaJolanta Shirl Weir, MD 04/08/2018, 9:54 AM

## 2018-04-09 MED ORDER — HYDROXYZINE HCL 50 MG PO TABS
50.0000 mg | ORAL_TABLET | Freq: Every day | ORAL | Status: DC
  Administered 2018-04-09 – 2018-04-12 (×4): 50 mg via ORAL
  Filled 2018-04-09 (×4): qty 1

## 2018-04-09 MED ORDER — TRAZODONE HCL 100 MG PO TABS
100.0000 mg | ORAL_TABLET | Freq: Every day | ORAL | Status: DC
  Administered 2018-04-09 – 2018-04-12 (×4): 100 mg via ORAL
  Filled 2018-04-09 (×4): qty 1

## 2018-04-09 NOTE — [facility] Group Notes (Signed)
  04/09/2018  Time: 1PM  Type of Therapy/Topic:  Group Therapy:  Balance in Life  Participation Level:  Did Not Attend  Description of Group:   This group will address the concept of balance and how it feels and looks when one is unbalanced. Patients will be encouraged to process areas in their lives that are out of balance and identify reasons for remaining unbalanced. Facilitators will guide patients in utilizing problem-solving interventions to address and correct the stressor making their life unbalanced. Understanding and applying boundaries will be explored and addressed for obtaining and maintaining a balanced life. Patients will be encouraged to explore ways to assertively make their unbalanced needs known to significant others in their lives, using other group members and facilitator for support and feedback.  Therapeutic Goals: 1. Patient will identify two or more emotions or situations they have that consume much of in their lives. 2. Patient will identify signs/triggers that life has become out of balance:  3. Patient will identify two ways to set boundaries in order to achieve balance in their lives:  4. Patient will demonstrate ability to communicate their needs through discussion and/or role plays  Summary of Patient Progress: Pt was invited to attend group but chose not to attend. CSW will continue to encourage pt to attend group throughout their admission.     Therapeutic Modalities:   Cognitive Behavioral Therapy Solution-Focused Therapy Assertiveness Training  Marquet Faircloth, MSW, LCSW Clinical Social Worker 04/09/2018 1:51 PM   

## 2018-04-09 NOTE — [facility] Group Notes (Signed)
[facility] Group Notes:  (Nursing/MHT/Case Management/Adjunct)  Date:  04/09/2018  Time:  10:26 PM  Type of Therapy:  Group Therapy  Participation Level:  Active  Participation Quality:  Appropriate  Affect:  Appropriate  Cognitive:  Appropriate  Insight:  Good  Engagement in Group:  Engaged and Supportive  Modes of Intervention:  Support  Summary of Progress/Problems:  Michael BeeJessica  Michael [name] 04/09/2018, 10:26 PM

## 2018-04-09 NOTE — Plan of Care (Signed)
Patient alert and oriented X 4.Denies SI, HI and AVH. Patient has no signs of withdrawals this morning. Patient is pleasant and smiling with bright affect. Patient attends groups, and able to interact with others appropriately. Patient remains free from harm. Patient only asks for tylenol for shoulder pain. Nurse will continue to monitor. Problem: Safety: Goal: Ability to remain free from injury will improve Outcome: Progressing   Problem: Coping: Goal: Ability to identify and develop effective coping behavior will improve Outcome: Progressing Goal: Ability to interact with others will improve Outcome: Progressing Goal: Demonstration of participation in decision-making regarding own care will improve Outcome: Progressing Goal: Ability to use eye contact when communicating with others will improve Outcome: Progressing   Problem: Education: Goal: Utilization of techniques to improve thought processes will improve Outcome: Progressing Goal: Knowledge of the prescribed therapeutic regimen will improve Outcome: Progressing

## 2018-04-09 NOTE — Progress Notes (Signed)
[facility] MD Progress Note  04/09/2018 6:28 PM Michael [name]  MRN:  [id number]  Subjective:   Mr. Jarboe is irritable this morning. He complains of acid reflux and symptoms of opioid withdrawal Asks to increase Clonidine to 0.1 TID. He was accepted to [facility] when bed open and to [facility] on Monday. He is not suicidal, homicidal or psychotic.   Principal Problem: Major depressive disorder, recurrent severe without psychotic features (HCC) Diagnosis:   Patient Active Problem List   Diagnosis Date Noted  . 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 Cannabis use disorder, moderate, dependence (HCC) [F12.20] 04/06/2018  . Cocaine use disorder, moderate, dependence (HCC) [F14.20] 04/06/2018  . Sedative, hypnotic or anxiolytic use disorder, severe, dependence (HCC) [F13.20] 04/06/2018  . Tobacco use disorder [F17.200] 04/06/2018   Total Time spent with patient: 20 minutes  Past Psychiatric History: depression  Past Medical History:  Past Medical History:  Diagnosis Date  . Sciatica    History reviewed. No pertinent surgical history. Family History: History reviewed. No pertinent family history. Family Psychiatric  History: bipolar Social History:  Social History   Substance and Sexual Activity  Alcohol Use Yes   Comment: "rarely"     Social History   Substance and Sexual Activity  Drug Use No    Social History   Socioeconomic History  . Marital status: Single    Spouse name: Not on file  . Number of children: Not on file  . Years of education: Not on file  . Highest education level: Not on file  Occupational History  . Not on file  Social Needs  . Financial resource strain: Not on file  . Food insecurity:    Worry: Not on file    Inability: Not on file  . Transportation needs:    Medical: Not on file    Non-medical: Not on file  Tobacco Use  . Smoking status: Current Every Day Smoker     Packs/day: 0.50    Types: Cigarettes  . Smokeless tobacco: Never Used  Substance and Sexual Activity  . Alcohol use: Yes    Comment: "rarely"  . Drug use: No  . Sexual activity: Not on file  Lifestyle  . Physical activity:    Days per week: Not on file    Minutes per session: Not on file  . Stress: Not on file  Relationships  . Social connections:    Talks on phone: Not on file    Gets together: Not on file    Attends religious service: Not on file    Active member of club or organization: Not on file    Attends meetings of clubs or organizations: Not on file    Relationship status: Not on file  Other Topics Concern  . Not on file  Social History Narrative  . Not on file   Additional Social History:                         Sleep: Fair  Appetite:  Fair  Current Medications: Current Facility-Administered Medications  Medication Dose Route Frequency Provider Last Rate Last Dose  . acetaminophen (TYLENOL) tablet 650 mg  650 mg Oral Q4H PRN Beverly Sessions, MD   650 mg at 04/09/18 1713  . alum & mag hydroxide-simeth (MAALOX/MYLANTA) 200-200-20 MG/5ML suspension 30 mL  30 mL Oral Q4H PRN Beverly Sessions, MD      .  cloNIDine (CATAPRES) tablet 0.1 mg  0.1 mg Oral BID Supriya Beaston B, MD   0.1 mg at 04/09/18 1709  . fluvoxaMINE (LUVOX) tablet 100 mg  100 mg Oral QHS Kashis Penley B, MD   100 mg at 04/08/18 2103  . hydrOXYzine (ATARAX/VISTARIL) tablet 50 mg  50 mg Oral QHS Zakery Normington B, MD      . ibuprofen (ADVIL,MOTRIN) tablet 600 mg  600 mg Oral Q6H PRN Dennice Tindol B, MD      . magnesium hydroxide (MILK OF MAGNESIA) suspension 30 mL  30 mL Oral Daily PRN Beverly SessionsSubedi, Jagannath, MD      . nicotine (NICODERM CQ - dosed in mg/24 hours) patch 21 mg  21 mg Transdermal Daily Beverly SessionsSubedi, Jagannath, MD   21 mg at 04/09/18 0818  . ondansetron (ZOFRAN-ODT) disintegrating tablet 4 mg  4 mg Oral QID PRN Beverly SessionsSubedi, Jagannath, MD      . risperiDONE (RISPERDAL)  tablet 2 mg  2 mg Oral BID Markos Theil B, MD   2 mg at 04/09/18 1709  . traZODone (DESYREL) tablet 100 mg  100 mg Oral QHS Ottavio Norem B, MD        Lab Results: No results found for this or any previous visit (from the past 48 hour(s)).  Blood Alcohol level:  Lab Results  Component Value Date   ETH <10 04/04/2018   ETH  12/21/2008    <5        LOWEST DETECTABLE LIMIT FOR SERUM ALCOHOL IS 5 mg/dL FOR MEDICAL PURPOSES ONLY    Metabolic Disorder Labs: No results found for: HGBA1C, MPG No results found for: PROLACTIN Lab Results  Component Value Date   CHOL 139 04/06/2018   TRIG 146 04/06/2018   HDL 37 (L) 04/06/2018   CHOLHDL 3.8 04/06/2018   VLDL 29 04/06/2018   LDLCALC 73 04/06/2018    Physical Findings: AIMS: Facial and Oral Movements Muscles of Facial Expression: None, normal Lips and Perioral Area: None, normal Jaw: None, normal Tongue: None, normal,Extremity Movements Upper (arms, wrists, hands, fingers): None, normal Lower (legs, knees, ankles, toes): None, normal, Trunk Movements Neck, shoulders, hips: None, normal, Overall Severity Severity of abnormal movements (highest score from questions above): None, normal Incapacitation due to abnormal movements: None, normal Patient's awareness of abnormal movements (rate only patient's report): No Awareness, Dental Status Current problems with teeth and/or dentures?: No Does patient usually wear dentures?: No  CIWA:  CIWA-Ar Total: 2 COWS:  COWS Total Score: 0  Musculoskeletal: Strength & Muscle Tone: within normal limits Gait & Station: normal Patient leans: N/A  Psychiatric Specialty Exam: Physical Exam  Nursing note and vitals reviewed. Psychiatric: His speech is normal. Thought content normal. His affect is blunt. He is withdrawn. Cognition and memory are normal. He expresses impulsivity. He exhibits a depressed mood.    Review of Systems  Neurological: Negative.   Psychiatric/Behavioral:  Positive for substance abuse.  All other systems reviewed and are negative.   Blood pressure 111/65, pulse 73, temperature (!) 97.4 F (36.3 C), temperature source Oral, resp. rate 18, height 5\' 6"  (1.676 m), weight 77.1 kg (170 lb), SpO2 100 %.Body mass index is 27.44 kg/m.  General Appearance: Casual  Eye Contact:  Good  Speech:  Clear and Coherent  Volume:  Normal  Mood:  Depressed  Affect:  Flat  Thought Process:  Goal Directed and Descriptions of Associations: Intact  Orientation:  Full (Time, Place, and Person)  Thought Content:  WDL  Suicidal Thoughts:  No  Homicidal Thoughts:  No  Memory:  Immediate;   Fair Recent;   Fair Remote;   Fair  Judgement:  Poor  Insight:  Lacking  Psychomotor Activity:  Decreased  Concentration:  Concentration: Fair and Attention Span: Fair  Recall:  [employer] of Knowledge:  Fair  Language:  Fair  Akathisia:  No  Handed:  Right  AIMS (if indicated):     Assets:  Communication Skills Desire for Improvement Physical Health Resilience  ADL's:  Intact  Cognition:  WNL  Sleep:  Number of Hours: 6.3     Treatment Plan Summary: Daily contact with patient to assess and evaluate symptoms and progress in treatment and Medication management   Mr. Duffey is a 37 year old male with a history of depression and substance abuse admitted for suicidal ideation with a plan in the context of substance use and severe social stressors.   #Suicidal ideation, resolved -patient able to contract for safety in the hos[pital  #Mood/anxiety, improving -continueLuvox to 100 mg nightly -continue Risperdal 2 mg BID  #Insomnia, improved -Trazodone100 mg nightlywith Vistaril 50 mg  #Substance abuse, no symptoms of withdrawal -positive for cocaine, cannabis, benzodiazepine, admits to heroine as well, no alcohol -increase Clonidine to 0.1 TID  #GERD -Protonix 40 mg daily  #Labs -lipid panel, TSH and [facility] normal -EKG reviewed, NSR with QTc  434  #Shoulder pain -Xray negative -Motrin 600 mg PRN  #Substance abuse treatment -accepted to [facility] and [facility]  #Disposition -transfer to [facility] on Monday    Kristine Linea, MD 04/09/2018, 6:28 PM

## 2018-04-09 NOTE — Progress Notes (Signed)
[facility] MD Progress Note  04/09/2018 2:14 PM Michael [name]  MRN:  [id number]  Subjective:    Michael [name] feels better. Mood is improving, affect is brighter. Noi suicidal ideations. Tolerates medications well. Slept very well last night and feels rested. He is awaiting [facility] bed. Complains of heroine withdrawal but they are very mild addressed with clonidine.  Principal Problem: Major depressive disorder, recurrent severe without psychotic features (HCC) Diagnosis:   Patient Active Problem List   Diagnosis Date Noted  . 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 Cannabis use disorder, moderate, dependence (HCC) [F12.20] 04/06/2018  . Cocaine use disorder, moderate, dependence (HCC) [F14.20] 04/06/2018  . Sedative, hypnotic or anxiolytic use disorder, severe, dependence (HCC) [F13.20] 04/06/2018  . Tobacco use disorder [F17.200] 04/06/2018   Total Time spent with patient: 15 minutes  Past Psychiatric History: depression  Past Medical History:  Past Medical History:  Diagnosis Date  . Sciatica    History reviewed. No pertinent surgical history. Family History: History reviewed. No pertinent family history. Family Psychiatric  History: depression Social History:  Social History   Substance and Sexual Activity  Alcohol Use Yes   Comment: "rarely"     Social History   Substance and Sexual Activity  Drug Use No    Social History   Socioeconomic History  . Marital status: Single    Spouse name: Not on file  . Number of children: Not on file  . Years of education: Not on file  . Highest education level: Not on file  Occupational History  . Not on file  Social Needs  . Financial resource strain: Not on file  . Food insecurity:    Worry: Not on file    Inability: Not on file  . Transportation needs:    Medical: Not on file    Non-medical: Not on file  Tobacco Use  . Smoking status:  Current Every Day Smoker    Packs/day: 0.50    Types: Cigarettes  . Smokeless tobacco: Never Used  Substance and Sexual Activity  . Alcohol use: Yes    Comment: "rarely"  . Drug use: No  . Sexual activity: Not on file  Lifestyle  . Physical activity:    Days per week: Not on file    Minutes per session: Not on file  . Stress: Not on file  Relationships  . Social connections:    Talks on phone: Not on file    Gets together: Not on file    Attends religious service: Not on file    Active member of club or organization: Not on file    Attends meetings of clubs or organizations: Not on file    Relationship status: Not on file  Other Topics Concern  . Not on file  Social History Narrative  . Not on file   Additional Social History:                         Sleep: Good  Appetite:  Fair  Current Medications: Current Facility-Administered Medications  Medication Dose Route Frequency Provider Last Rate Last Dose  . acetaminophen (TYLENOL) tablet 650 mg  650 mg Oral Q4H PRN Beverly Sessions, MD   650 mg at 04/09/18 0823  . alum & mag hydroxide-simeth (MAALOX/MYLANTA) 200-200-20 MG/5ML suspension 30 mL  30 mL Oral Q4H PRN Beverly Sessions, MD      . cloNIDine (CATAPRES)  tablet 0.1 mg  0.1 mg Oral BID Takoya Jonas B, MD   0.1 mg at 04/09/18 [id number]  . fluvoxaMINE (LUVOX) tablet 100 mg  100 mg Oral QHS Elowyn Raupp B, MD   100 mg at 04/08/18 2103  . hydrOXYzine (ATARAX/VISTARIL) tablet 50 mg  50 mg Oral TID PRN Beverly SessionsSubedi, Jagannath, MD   50 mg at 04/08/18 2103  . ibuprofen (ADVIL,MOTRIN) tablet 600 mg  600 mg Oral Q6H PRN Deyjah Kindel B, MD      . magnesium hydroxide (MILK OF MAGNESIA) suspension 30 mL  30 mL Oral Daily PRN Beverly SessionsSubedi, Jagannath, MD      . nicotine (NICODERM CQ - dosed in mg/24 hours) patch 21 mg  21 mg Transdermal Daily Beverly SessionsSubedi, Jagannath, MD   21 mg at 04/09/18 0818  . ondansetron (ZOFRAN-ODT) disintegrating tablet 4 mg  4 mg Oral QID PRN  Beverly SessionsSubedi, Jagannath, MD      . risperiDONE (RISPERDAL) tablet 2 mg  2 mg Oral BID Tay Whitwell B, MD   2 mg at 04/09/18 0818  . traZODone (DESYREL) tablet 100 mg  100 mg Oral QHS PRN Jameah Rouser B, MD   100 mg at 04/08/18 2103    Lab Results: No results found for this or any previous visit (from the past 48 hour(s)).  Blood Alcohol level:  Lab Results  Component Value Date   ETH <10 04/04/2018   ETH  12/21/2008    <5        LOWEST DETECTABLE LIMIT FOR SERUM ALCOHOL IS 5 mg/dL FOR MEDICAL PURPOSES ONLY    Metabolic Disorder Labs: No results found for: HGBA1C, MPG No results found for: PROLACTIN Lab Results  Component Value Date   CHOL 139 04/06/2018   TRIG 146 04/06/2018   HDL 37 (L) 04/06/2018   CHOLHDL 3.8 04/06/2018   VLDL 29 04/06/2018   LDLCALC 73 04/06/2018    Physical Findings: AIMS: Facial and Oral Movements Muscles of Facial Expression: None, normal Lips and Perioral Area: None, normal Jaw: None, normal Tongue: None, normal,Extremity Movements Upper (arms, wrists, hands, fingers): None, normal Lower (legs, knees, ankles, toes): None, normal, Trunk Movements Neck, shoulders, hips: None, normal, Overall Severity Severity of abnormal movements (highest score from questions above): None, normal Incapacitation due to abnormal movements: None, normal Patient's awareness of abnormal movements (rate only patient's report): No Awareness, Dental Status Current problems with teeth and/or dentures?: No Does patient usually wear dentures?: No  CIWA:  CIWA-Ar Total: 2 COWS:  COWS Total Score: 0  Musculoskeletal: Strength & Muscle Tone: within normal limits Gait & Station: normal Patient leans: N/A  Psychiatric Specialty Exam: Physical Exam  Nursing note and vitals reviewed. Psychiatric: His speech is normal. Thought content normal. His affect is blunt. He is withdrawn. Cognition and memory are normal. He expresses impulsivity. He exhibits a depressed  mood.    Review of Systems  Neurological: Negative.   Psychiatric/Behavioral: Positive for depression and substance abuse.  All other systems reviewed and are negative.   Blood pressure (!) 131/100, pulse 73, temperature (!) 97.4 F (36.3 C), temperature source Oral, resp. rate 18, height 5\' 6"  (1.676 m), weight 77.1 kg (170 lb), SpO2 100 %.Body mass index is 27.44 kg/m.  General Appearance: Casual  Eye Contact:  Good  Speech:  Clear and Coherent  Volume:  Normal  Mood:  Anxious  Affect:  Appropriate  Thought Process:  Goal Directed and Descriptions of Associations: Intact  Orientation:  Full (Time, Place, and Person)  Thought Content:  WDL  Suicidal Thoughts:  No  Homicidal Thoughts:  No  Memory:  Immediate;   Fair Recent;   Fair Remote;   Fair  Judgement:  Poor  Insight:  Lacking  Psychomotor Activity:  Decreased  Concentration:  Concentration: Fair and Attention Span: Fair  Recall:  [employer] of Knowledge:  Fair  Language:  Fair  Akathisia:  No  Handed:  Right  AIMS (if indicated):     Assets:  Communication Skills Desire for Improvement Physical Health Resilience  ADL's:  Intact  Cognition:  WNL  Sleep:  Number of Hours: 6.3     Treatment Plan Summary: Daily contact with patient to assess and evaluate symptoms and progress in treatment and Medication management   Michael [name] is a 37 year old male with a history of depression and substance abuse admitted for suicidal ideation with a plan in the context of substance use and severe social stressors.   #Suicidal ideation, resolved -patient able to contract for safety in the hos[pital  #Mood/anxiety, improving -continue Luvox to 100 mg nightly -continue Risperdal 2 mg BID  #Insomnia, improved -Trazodone 100 mg nightlywith Vistaril   #Substance abuse, no symptoms of withdrawal -positive for cocaine, cannabis, benzodiazepine, admits to heroine as well, no alcohol -symptomatic treatment for opioid  withdrawal -continue Clonidine to 0.1 mg BID  #Labs -lipid panel, TSH and [facility] normal -EKG reviewed, NSR with QTc 434  #Shoulder pain -Xray negative -Motrin 600 mg PRN  #Substance abuse treatment -on wait list at [facility] -would benefit from [facility] referral  #Disposition -[facility]     Kristine Linea, MD 04/09/2018, 2:14 PM

## 2018-04-09 NOTE — [facility] Group Notes (Signed)
LCSW Group Therapy Note 04/09/2018 9:00 AM  Type of Therapy and Topic:  Group Therapy:  Setting Goals  Participation Level:  Active  Description of Group: In this process group, patients discussed using strengths to work toward goals and address challenges.  Patients identified two positive things about themselves and one goal they were working on.  Patients were given the opportunity to share openly and support each other's plan for self-empowerment.  The group discussed the value of gratitude and were encouraged to have a daily reflection of positive characteristics or circumstances.  Patients were encouraged to identify a plan to utilize their strengths to work on current challenges and goals.  Therapeutic Goals 1. Patient will verbalize personal strengths/positive qualities and relate how these can assist with achieving desired personal goals 2. Patients will verbalize affirmation of peers plans for personal change and goal setting 3. Patients will explore the value of gratitude and positive focus as related to successful achievement of goals 4. Patients will verbalize a plan for regular reinforcement of personal positive qualities and circumstances.  Summary of Patient Progress:  Michael [name] actively participated in today's group discussion on setting goals using the SMART Model.  Michael [name] appeared to have a good understanding of the SMART Model and how it can be applied to help him to set his own life goals.  Michael [name] shared that a goal that he will start working on while in the hospital is "get my meds right so I can feel right".  Michael [name] shared that he never pursued mental health treatment prior to this hospitalization and used illicit drugs to help with his symptoms.  Michael [name] shared that he is feeling better emotionally and will continue to take the medications when he is discharged.  Michael [name] also shared that he will seek counseling services when he is discharged to help him to work through his  "issues".     Therapeutic Modalities Cognitive Behavioral Therapy Motivational Interviewing    Michael FrameSonya S Thaer [name], [place]CS[place] 04/09/2018 11:04 AM

## 2018-04-09 NOTE — Progress Notes (Signed)
Recreation Therapy Notes  Date: 04/09/2018  Time: 9:30 am  Location: Room 21  Behavioral response: Appropriate   Intervention Topic: Animal Assisted Therapy  Discussion/Intervention:  Patient participated in Animal Assisted Therapy during group today. Group facilitator defined Animal Assisted Therapy as the use of animals as a therapeutic tool to assist a person in restoring balance to their life.  The group facilitator also described the benefits of Animal Assisted Therapy as improving patients' mental, physical, social and emotional functioning with the aid of animals; depending on the needs of the patient. Individuals in the group were able to pet the dogs as well as ask questions. Clinical Observations/Feedback:  Patient came to group and was on topic and appropriate. Individual was social with peers and staff during group. Participant asked questions and made positive contact with the animals.  Michael [name] LRT/CTRS          Michael [name] 04/09/2018 11:00 AM

## 2018-04-10 MED ORDER — PANTOPRAZOLE SODIUM 40 MG PO TBEC
40.0000 mg | DELAYED_RELEASE_TABLET | Freq: Every day | ORAL | Status: DC
  Administered 2018-04-10 – 2018-04-13 (×4): 40 mg via ORAL
  Filled 2018-04-10 (×4): qty 1

## 2018-04-10 MED ORDER — CLONIDINE HCL 0.1 MG PO TABS
0.1000 mg | ORAL_TABLET | Freq: Three times a day (TID) | ORAL | Status: DC
  Administered 2018-04-10 – 2018-04-13 (×8): 0.1 mg via ORAL
  Filled 2018-04-10 (×8): qty 1

## 2018-04-10 NOTE — Plan of Care (Signed)
Patient was c/o acid reflux and stated that he could not sleep because of this.Denies SI,HI and AVH.Looking forward for the residential programs.Patient verbalized some withdrawal symptoms like sweating.Pleasant and cooperative on approach.Appetite and energy level good.Compliant with medications.Support and encouragement given.

## 2018-04-10 NOTE — [facility] Group Notes (Signed)
[facility] Group Notes:  (Nursing/MHT/Case Management/Adjunct)  Date:  04/10/2018  Time:  9:21 PM  Type of Therapy:  Group Therapy  Participation Level:  Active  Participation Quality:  Appropriate  Affect:  Appropriate  Cognitive:  Appropriate  Insight:  Appropriate  Engagement in Group:  Engaged  Modes of Intervention:  Discussion  Summary of Progress/Problems:  Michael NeighborsKeith D Anora [name] 04/10/2018, 9:21 PM

## 2018-04-10 NOTE — Tx Team (Signed)
yInterdisciplinary Treatment and Diagnostic Plan Update  04/10/2018 Time of Session: 10:30am Michael [name] MRN: [id number]  Principal Diagnosis: Major depressive disorder, recurrent severe without psychotic features (HCC)  Secondary Diagnoses: Principal Problem:   Major depressive disorder, recurrent severe without psychotic features (HCC) Active Problems:   Cannabis use disorder, moderate, dependence (HCC)   Cocaine use disorder, moderate, dependence (HCC)   Sedative, hypnotic or anxiolytic use disorder, severe, dependence (HCC)   Tobacco use disorder   Current Medications:  Current Facility-Administered Medications  Medication Dose Route Frequency Provider Last Rate Last Dose  . acetaminophen (TYLENOL) tablet 650 mg  650 mg Oral Q4H PRN Beverly Sessions, MD   650 mg at 04/09/18 1713  . alum & mag hydroxide-simeth (MAALOX/MYLANTA) 200-200-20 MG/5ML suspension 30 mL  30 mL Oral Q4H PRN Beverly Sessions, MD   30 mL at 04/10/18 0617  . cloNIDine (CATAPRES) tablet 0.1 mg  0.1 mg Oral BID Pucilowska, Jolanta B, MD   0.1 mg at 04/10/18 0814  . fluvoxaMINE (LUVOX) tablet 100 mg  100 mg Oral QHS Pucilowska, Jolanta B, MD   100 mg at 04/09/18 2028  . hydrOXYzine (ATARAX/VISTARIL) tablet 50 mg  50 mg Oral QHS Pucilowska, Jolanta B, MD   50 mg at 04/09/18 2028  . ibuprofen (ADVIL,MOTRIN) tablet 600 mg  600 mg Oral Q6H PRN Pucilowska, Jolanta B, MD   600 mg at 04/09/18 2030  . magnesium hydroxide (MILK OF MAGNESIA) suspension 30 mL  30 mL Oral Daily PRN Beverly Sessions, MD      . nicotine (NICODERM CQ - dosed in mg/24 hours) patch 21 mg  21 mg Transdermal Daily Beverly Sessions, MD   21 mg at 04/10/18 0814  . ondansetron (ZOFRAN-ODT) disintegrating tablet 4 mg  4 mg Oral QID PRN Beverly Sessions, MD      . risperiDONE (RISPERDAL) tablet 2 mg  2 mg Oral BID Pucilowska, Jolanta B, MD   2 mg at 04/10/18 0814  . traZODone (DESYREL) tablet 100 mg  100 mg Oral QHS Pucilowska, Jolanta B,  MD   100 mg at 04/09/18 2029   PTA Medications: Medications Prior to Admission  Medication Sig Dispense Refill Last Dose  . acetaminophen-codeine (TYLENOL #3) 300-30 MG tablet Take 1 tablet by mouth every 4 (four) hours as needed for moderate pain. 20 tablet 0     Patient Stressors: Financial difficulties Occupational concerns Substance abuse  Patient Strengths: Average or above average intelligence Capable of independent living Barrister's clerk for treatment/growth Physical Health  Treatment Modalities: Medication Management, Group therapy, Case management,  1 to 1 session with clinician, Psychoeducation, Recreational therapy.   Physician Treatment Plan for Primary Diagnosis: Major depressive disorder, recurrent severe without psychotic features (HCC) Long Term Goal(s): Improvement in symptoms so as ready for discharge Improvement in symptoms so as ready for discharge   Short Term Goals: Ability to identify changes in lifestyle to reduce recurrence of condition will improve Ability to verbalize feelings will improve Ability to disclose and discuss suicidal ideas Ability to demonstrate self-control will improve Ability to identify and develop effective coping behaviors will improve Ability to maintain clinical measurements within normal limits will improve Compliance with prescribed medications will improve Ability to identify triggers associated with substance abuse/mental health issues will improve Ability to identify changes in lifestyle to reduce recurrence of condition will improve Ability to demonstrate self-control will improve Ability to identify triggers associated with substance abuse/mental health issues will improve  Medication Management: Evaluate patient's response, side  effects, and tolerance of medication regimen.  Therapeutic Interventions: 1 to 1 sessions, Unit Group sessions and Medication administration.  Evaluation of Outcomes:  Progressing  Physician Treatment Plan for Secondary Diagnosis: Principal Problem:   Major depressive disorder, recurrent severe without psychotic features (HCC) Active Problems:   Cannabis use disorder, moderate, dependence (HCC)   Cocaine use disorder, moderate, dependence (HCC)   Sedative, hypnotic or anxiolytic use disorder, severe, dependence (HCC)   Tobacco use disorder  Long Term Goal(s): Improvement in symptoms so as ready for discharge Improvement in symptoms so as ready for discharge   Short Term Goals: Ability to identify changes in lifestyle to reduce recurrence of condition will improve Ability to verbalize feelings will improve Ability to disclose and discuss suicidal ideas Ability to demonstrate self-control will improve Ability to identify and develop effective coping behaviors will improve Ability to maintain clinical measurements within normal limits will improve Compliance with prescribed medications will improve Ability to identify triggers associated with substance abuse/mental health issues will improve Ability to identify changes in lifestyle to reduce recurrence of condition will improve Ability to demonstrate self-control will improve Ability to identify triggers associated with substance abuse/mental health issues will improve     Medication Management: Evaluate patient's response, side effects, and tolerance of medication regimen.  Therapeutic Interventions: 1 to 1 sessions, Unit Group sessions and Medication administration.  Evaluation of Outcomes: Progressing   RN Treatment Plan for Primary Diagnosis: Major depressive disorder, recurrent severe without psychotic features (HCC) Long Term Goal(s): Knowledge of disease and therapeutic regimen to maintain health will improve  Short Term Goals: Ability to verbalize feelings will improve, Ability to identify and develop effective coping behaviors will improve and Compliance with prescribed medications will  improve  Medication Management: RN will administer medications as ordered by provider, will assess and evaluate patient's response and provide education to patient for prescribed medication. RN will report any adverse and/or side effects to prescribing provider.  Therapeutic Interventions: 1 on 1 counseling sessions, Psychoeducation, Medication administration, Evaluate responses to treatment, Monitor vital signs and CBGs as ordered, Perform/monitor CIWA, COWS, AIMS and Fall Risk screenings as ordered, Perform wound care treatments as ordered.  Evaluation of Outcomes: Progressing   LCSW Treatment Plan for Primary Diagnosis: Major depressive disorder, recurrent severe without psychotic features (HCC) Long Term Goal(s): Safe transition to appropriate next level of care at discharge, Engage patient in therapeutic group addressing interpersonal concerns.  Short Term Goals: Engage patient in aftercare planning with referrals and resources, Increase social support, Facilitate patient progression through stages of change regarding substance use diagnoses and concerns, Identify triggers associated with mental health/substance abuse issues and Increase skills for wellness and recovery  Therapeutic Interventions: Assess for all discharge needs, 1 to 1 time with Social worker, Explore available resources and support systems, Assess for adequacy in community support network, Educate family and significant other(s) on suicide prevention, Complete Psychosocial Assessment, Interpersonal group therapy.  Evaluation of Outcomes: Progressing   Progress in Treatment: Attending groups: Yes. Participating in groups: Yes. Taking medication as prescribed: Yes. Toleration medication: Yes. Family/Significant other contact made: No, will contact:  Patient refused Patient understands diagnosis: Yes. Discussing patient identified problems/goals with staff: Yes. Medical problems stabilized or resolved: Yes. Denies  suicidal/homicidal ideation: No. Issues/concerns per patient self-inventory: No. Other:   New problem(s) identified: No, Describe:  None  New Short Term/Long Term Goal(s): "To get better."  Patient Goals:  "To get better."  Discharge Plan or Barriers: To enter into a residential rehab  program.  Reason for Continuation of Hospitalization: Depression Medication stabilization Suicidal ideation  Estimated Length of Stay: 3 days  Recreational Therapy: Patient Stressors: N/A  Patient Goal: Patient will identify 3 triggers for substance use within 5 recreation therapy group sessions  Attendees: Patient: 04/10/2018 11:28 AM  Physician: Dr. Jennet Maduro, MD 04/10/2018 11:28 AM  Nursing: Leonia Reader, RN 04/10/2018 11:28 AM  RN Care Manager: 04/10/2018 11:28 AM  Social Worker: Johny Shears, LCSWA 04/10/2018 11:28 AM  Recreational Therapist: Danella Deis. Dreama Saa, LRT 04/10/2018 11:28 AM  Other: Heidi Dach, LCSW 04/10/2018 11:28 AM  Other: Damian Leavell, Chaplin 04/10/2018 11:28 AM  Other:  04/10/2018 11:28 AM    Scribe for Treatment Team: Johny Shears, LCSW 04/10/2018 11:28 AM

## 2018-04-10 NOTE — Plan of Care (Addendum)
Patient found in common area upon my arrival. Patient is visible and social this evening. Complains of anxiety but affect is incongruent. Patient is animated and pleasant. Given Vistaril with positive results. Denies SI/HI/AVH. Complains of pain, given Motrin with some relief. Reports eating and voiding adequately. Compliant with HS medications and staff direction. Q 15 minute checks maintained. Will continue to monitor throughout the shift. @0210 , patient reports heartburn, given Maalox. Will monitor for efficacy. @ [phone], patient reports that indigestion went away for a little while but that it had returned. Given Maalox.  Patient slept 7.75 hours. Will endorse care to oncoming shift.  Problem: Coping: Goal: Coping ability will improve Outcome: Progressing   Problem: Safety: Goal: Ability to remain free from injury will improve Outcome: Progressing   Problem: Coping: Goal: Ability to interact with others will improve Outcome: Progressing Goal: Ability to use eye contact when communicating with others will improve Outcome: Progressing   Problem: Education: Goal: Knowledge of the prescribed therapeutic regimen will improve Outcome: Progressing

## 2018-04-10 NOTE — BHH Group Notes (Signed)
04/10/2018 1PM  Type of Therapy and Topic:  Group Therapy:  Feelings around Relapse and Recovery  Participation Level:  Active   Description of Group:    Patients in this group will discuss emotions they experience before and after a relapse. They will process how experiencing these feelings, or avoidance of experiencing them, relates to having a relapse. Facilitator will guide patients to explore emotions they have related to recovery. Patients will be encouraged to process which emotions are more powerful. They will be guided to discuss the emotional reaction significant others in their lives may have to patients' relapse or recovery. Patients will be assisted in exploring ways to respond to the emotions of others without this contributing to a relapse.  Therapeutic Goals: 1. Patient will identify two or more emotions that lead to a relapse for them 2. Patient will identify two emotions that result when they relapse 3. Patient will identify two emotions related to recovery 4. Patient will demonstrate ability to communicate their needs through discussion and/or role plays   Summary of Patient Progress: Actively and appropriately engaged in the group. Patient was able to provide support and validation to other group members.Patient practiced active listening when interacting with the facilitator and other group members. Patient is still in the process of obtaining treatment goals.      Therapeutic Modalities:   Cognitive Behavioral Therapy Solution-Focused Therapy Assertiveness Training Relapse Prevention Therapy   Maddex Garlitz, LCSW 04/10/2018 1:48 PM    

## 2018-04-10 NOTE — Progress Notes (Signed)
Recreation Therapy Notes  Date: 04/10/2018  Time: 9:30 am   Location: Craft room   Behavioral response: N/A   Intervention Topic: Problem Solving  Discussion/Intervention: Patient did not attend group.   Clinical Observations/Feedback:  Patient did not attend group.   Randell Teare LRT/CTRS        Jill Ruppe 04/10/2018 10:25 AM 

## 2018-04-10 NOTE — BHH Counselor (Signed)
CSW [facility] that that the patient can get to his scheduled intake with [facility].   Michael Shearsassandra Meredeth Furber, MSW, Theresia MajorsLCSWA, Bridget HartshornLCASA Clinical Social Worker 04/10/2018 11:43 AM

## 2018-04-10 NOTE — B[facility] Counselor (Signed)
CSW called [employer] and arranged for them to pick up the patient at 8am on Monday April 13, 2018.   Johny Shearsassandra Ayn Domangue, MSW, Theresia MajorsLCSWA, Bridget HartshornLCASA Clinical Social Worker 04/10/2018 11:52 AM

## 2018-04-10 NOTE — B[facility] Counselor (Signed)
[facility]. The patient was accepted into [facility] and is scheduled to attend Monday April 13, 2018 at 10am.   Johny Shearsassandra Sashay Felling, MSW, Theresia MajorsLCSWA, [place] Clinical Social Worker 04/10/2018 11:26 AM

## 2018-04-11 DIAGNOSIS — F332 Major depressive disorder, recurrent severe without psychotic features: Principal | ICD-10-CM

## 2018-04-11 NOTE — Progress Notes (Signed)
[facility] MD Progress Note  04/11/2018 1:38 PM Michael [name]  MRN:  [id number] Subjective: "I am feeling okay I guess".  Patient with substance abuse and depression.  Mood has improved.  No active suicidal or homicidal thoughts.  No hallucinations.  Still a little withdrawn and dysphoric but acknowledges some hopefulness.  He is agreeable to the plan to go to the alcohol and drug abuse treatment Center in [place] on Monday and feels optimistic about it. Principal Problem: Major depressive disorder, recurrent severe without psychotic features (HCC) Diagnosis:   Patient Active Problem List   Diagnosis Date Noted  . Major depressive disorder, recurrent severe without psychotic features (HCC) [F33.2] 04/06/2018  . Cannabis use disorder, moderate, dependence (HCC) [F12.20] 04/06/2018  . Cocaine use disorder, moderate, dependence (HCC) [F14.20] 04/06/2018  . Sedative, hypnotic or anxiolytic use disorder, severe, dependence (HCC) [F13.20] 04/06/2018  . Tobacco use disorder [F17.200] 04/06/2018   Total Time spent with patient: 20 minutes  Past Psychiatric History: History of depression and substance abuse  Past Medical History:  Past Medical History:  Diagnosis Date  . Sciatica    History reviewed. No pertinent surgical history. Family History: History reviewed. No pertinent family history. Family Psychiatric  History: Depression Social History:  Social History   Substance and Sexual Activity  Alcohol Use Yes   Comment: "rarely"     Social History   Substance and Sexual Activity  Drug Use No    Social History   Socioeconomic History  . Marital status: Single    Spouse name: Not on file  . Number of children: Not on file  . Years of education: Not on file  . Highest education level: Not on file  Occupational History  . Not on file  Social Needs  . Financial resource strain: Not on file  . Food insecurity:    Worry: Not on file    Inability: Not on file  .  Transportation needs:    Medical: Not on file    Non-medical: Not on file  Tobacco Use  . Smoking status: Current Every Day Smoker    Packs/day: 0.50    Types: Cigarettes  . Smokeless tobacco: Never Used  Substance and Sexual Activity  . Alcohol use: Yes    Comment: "rarely"  . Drug use: No  . Sexual activity: Not on file  Lifestyle  . Physical activity:    Days per week: Not on file    Minutes per session: Not on file  . Stress: Not on file  Relationships  . Social connections:    Talks on phone: Not on file    Gets together: Not on file    Attends religious service: Not on file    Active member of club or organization: Not on file    Attends meetings of clubs or organizations: Not on file    Relationship status: Not on file  Other Topics Concern  . Not on file  Social History Narrative  . Not on file   Additional Social History:                         Sleep: Fair  Appetite:  Fair  Current Medications: Current Facility-Administered Medications  Medication Dose Route Frequency Provider Last Rate Last Dose  . acetaminophen (TYLENOL) tablet 650 mg  650 mg Oral Q4H PRN Beverly Sessions, MD   650 mg at 04/10/18 1255  . alum & mag hydroxide-simeth (MAALOX/MYLANTA) 200-200-20 MG/5ML suspension 30  mL  30 mL Oral Q4H PRN Beverly Sessions, MD   30 mL at 04/10/18 0617  . cloNIDine (CATAPRES) tablet 0.1 mg  0.1 mg Oral TID Pucilowska, Jolanta B, MD   0.1 mg at 04/11/18 1235  . fluvoxaMINE (LUVOX) tablet 100 mg  100 mg Oral QHS Pucilowska, Jolanta B, MD   100 mg at 04/10/18 2118  . hydrOXYzine (ATARAX/VISTARIL) tablet 50 mg  50 mg Oral QHS Pucilowska, Jolanta B, MD   50 mg at 04/10/18 2118  . ibuprofen (ADVIL,MOTRIN) tablet 600 mg  600 mg Oral Q6H PRN Pucilowska, Jolanta B, MD   600 mg at 04/09/18 2030  . magnesium hydroxide (MILK OF MAGNESIA) suspension 30 mL  30 mL Oral Daily PRN Beverly Sessions, MD      . nicotine (NICODERM CQ - dosed in mg/24 hours) patch 21 mg   21 mg Transdermal Daily Beverly Sessions, MD   21 mg at 04/11/18 0807  . ondansetron (ZOFRAN-ODT) disintegrating tablet 4 mg  4 mg Oral QID PRN Beverly Sessions, MD      . pantoprazole (PROTONIX) EC tablet 40 mg  40 mg Oral Daily Pucilowska, Jolanta B, MD   40 mg at 04/11/18 0806  . risperiDONE (RISPERDAL) tablet 2 mg  2 mg Oral BID Pucilowska, Jolanta B, MD   2 mg at 04/11/18 0806  . traZODone (DESYREL) tablet 100 mg  100 mg Oral QHS Pucilowska, Jolanta B, MD   100 mg at 04/10/18 2118    Lab Results: No results found for this or any previous visit (from the past 48 hour(s)).  Blood Alcohol level:  Lab Results  Component Value Date   ETH <10 04/04/2018   ETH  12/21/2008    <5        LOWEST DETECTABLE LIMIT FOR SERUM ALCOHOL IS 5 mg/dL FOR MEDICAL PURPOSES ONLY    Metabolic Disorder Labs: No results found for: HGBA1C, MPG No results found for: PROLACTIN Lab Results  Component Value Date   CHOL 139 04/06/2018   TRIG 146 04/06/2018   HDL 37 (L) 04/06/2018   CHOLHDL 3.8 04/06/2018   VLDL 29 04/06/2018   LDLCALC 73 04/06/2018    Physical Findings: AIMS: Facial and Oral Movements Muscles of Facial Expression: None, normal Lips and Perioral Area: None, normal Jaw: None, normal Tongue: None, normal,Extremity Movements Upper (arms, wrists, hands, fingers): None, normal Lower (legs, knees, ankles, toes): None, normal, Trunk Movements Neck, shoulders, hips: None, normal, Overall Severity Severity of abnormal movements (highest score from questions above): None, normal Incapacitation due to abnormal movements: None, normal Patient's awareness of abnormal movements (rate only patient's report): No Awareness, Dental Status Current problems with teeth and/or dentures?: No Does patient usually wear dentures?: No  CIWA:  CIWA-Ar Total: 2 COWS:  COWS Total Score: 0  Musculoskeletal: Strength & Muscle Tone: within normal limits Gait & Station: normal Patient leans:  N/A  Psychiatric Specialty Exam: Physical Exam  Nursing note and vitals reviewed. Constitutional: He appears well-developed and well-nourished.  HENT:  Head: Normocephalic and atraumatic.  Eyes: Pupils are equal, round, and reactive to light. Conjunctivae are normal.  Neck: Normal range of motion.  Cardiovascular: Regular rhythm and normal heart sounds.  Respiratory: Effort normal. No respiratory distress.  GI: Soft.  Musculoskeletal: Normal range of motion.  Neurological: He is alert.  Skin: Skin is warm and dry.  Psychiatric: He has a normal mood and affect. His behavior is normal. Judgment and thought content normal.    Review of Systems  Constitutional: Negative.   HENT: Negative.   Eyes: Negative.   Respiratory: Negative.   Cardiovascular: Negative.   Gastrointestinal: Negative.   Musculoskeletal: Negative.   Skin: Negative.   Neurological: Negative.   Psychiatric/Behavioral: Negative.     Blood pressure (!) 94/58, pulse (!) 116, temperature 97.6 F (36.4 C), temperature source Oral, resp. rate 18, height 5\' 6"  (1.676 m), weight 77.1 kg (170 lb), SpO2 99 %.Body mass index is 27.44 kg/m.  General Appearance: Casual  Eye Contact:  Good  Speech:  Slow  Volume:  Decreased  Mood:  Euthymic  Affect:  Constricted  Thought Process:  Goal Directed  Orientation:  Full (Time, Place, and Person)  Thought Content:  Logical  Suicidal Thoughts:  No  Homicidal Thoughts:  No  Memory:  Immediate;   Fair Recent;   Fair Remote;   Fair  Judgement:  Fair  Insight:  Fair  Psychomotor Activity:  Decreased  Concentration:  Concentration: Fair  Recall:  FiservFair  Fund of Knowledge:  Fair  Language:  Fair  Akathisia:  No  Handed:  Right  AIMS (if indicated):     Assets:  Desire for Improvement Physical Health  ADL's:  Intact  Cognition:  WNL  Sleep:  Number of Hours: 8     Treatment Plan Summary: Daily contact with patient to assess and evaluate symptoms and progress in  treatment, Medication management and Plan Patient is agreeable to going to the alcohol and drug abuse treatment Center.  Spent some time doing supportive counseling with him encouraging him to be positive about substance abuse treatment.  Encourage attendance at groups here on the unit.  No indication for specific change to medication today.  Michael RasmussenJohn Raye Slyter, MD 04/11/2018, 1:38 PM

## 2018-04-11 NOTE — Plan of Care (Signed)
Pt calm and cooperative this evening. Pt anxiously waited on his evening medications. Pt denies SI/HI. Pt is receptive to treatment and safety maintained on unit. Will continue to monitor. Problem: Education: Goal: Ability to make informed decisions regarding treatment will improve Outcome: Progressing   Problem: Self-Concept: Goal: Ability to disclose and discuss suicidal ideas will improve Outcome: Progressing Goal: Will verbalize positive feelings about self Outcome: Progressing   Problem: Coping: Goal: Ability to identify and develop effective coping behavior will improve Outcome: Progressing Goal: Demonstration of participation in decision-making regarding own care will improve Outcome: Progressing   Problem: Education: Goal: Knowledge of the prescribed therapeutic regimen will improve Outcome: Progressing

## 2018-04-11 NOTE — B[facility] Group Notes (Signed)
B[facility] Group Notes:  (Nursing/MHT/Case Management/Adjunct)  Date:  04/11/2018  Time:  10:11 PM  Type of Therapy:  Group Therapy  Participation Level:  Active  Participation Quality:  Appropriate and Sharing  Affect:  Appropriate  Cognitive:  Appropriate  Insight:  Appropriate  Engagement in Group:  Engaged  Modes of Intervention:  Discussion  Summary of Progress/Problems: Group reviewed rules of unit and expectations. Group made introductions and discussed goal for the day. Group addressed barriers that hindered progress of goals. Group discussed coping skills learned while on the unit and how group could utilize when returning to natural setting. Michael [name] stated he did not accomplish his goal for the day. Michael [name] stated his goal was to be happier, but was not able to acheive due to family talking down to him. Discussed focusing on recovery and not dwelling on past mistakes.  Explained he does not have to listen to people talking negative to him and provided with options on how to address in the future. Michael NeighborsKeith D Maiya [name] 04/11/2018, 10:11 PM

## 2018-04-11 NOTE — Plan of Care (Addendum)
Patient found in day room upon my arrival. Patient is visible and social this evening. Patient is interactive and attended group. Continues to complain of depression and anxiety but affect is incongruent. Patient appears drowsy but reports anxiety. Denies SI/HI/AVH. Denies pain. Reports eating and voiding adequately. Compliant with HS medications and staff direction. Q 15 minute checks maintained. Will continue to monitor throughout the shift. Patient slept 7.5 hours. No apparent distress. Will endorse care to oncoming shift.  Problem: Coping: Goal: Coping ability will improve Outcome: Progressing   Problem: Self-Concept: Goal: Ability to disclose and discuss suicidal ideas will improve Outcome: Progressing   Problem: Coping: Goal: Ability to interact with others will improve Outcome: Progressing Goal: Ability to use eye contact when communicating with others will improve Outcome: Progressing   Problem: Education: Goal: Knowledge of the prescribed therapeutic regimen will improve Outcome: Progressing

## 2018-04-11 NOTE — [facility] Group Notes (Signed)
LCSW Group Therapy Note  04/11/2018 1:15pm  Type of Therapy and Topic:  Group Therapy:  Cognitive Distortions  Participation Level:  Did Not Attend   Description of Group:    Patients in this group will be introduced to the topic of cognitive distortions.  Patients will identify and describe cognitive distortions, describe the feelings these distortions create for them.  Patients will identify one or more situations in their personal life where they have cognitively distorted thinking and will verbalize challenging this cognitive distortion through positive thinking skills.  Patients will practice the skill of using positive affirmations to challenge cognitive distortions using affirmation cards.    Therapeutic Goals:  1. Patient will identify two or more cognitive distortions they have used 2. Patient will identify one or more emotions that stem from use of a cognitive distortion 3. Patient will demonstrate use of a positive affirmation to counter a cognitive distortion through discussion and/or role play. 4. Patient will describe one way cognitive distortions can be detrimental to wellness   Summary of Patient Progress: Pt was invited to attend group but chose not to attend. CSW will continue to encourage pt to attend group throughout their admission.      Therapeutic Modalities:   Cognitive Behavioral Therapy Motivational Interviewing   Michael [name]  CUEBAS-COLON, LCSW 04/11/2018 10:03 AM   

## 2018-04-11 NOTE — Plan of Care (Signed)
Patient is alert and oriented X 4.  Patient denies SI, HI and AVH. Patient did not complain of shoulder pain this morning. Patient excited about going to [facility] on Monday. Patient is compliant and pleasant, no concerns this morning. Patient stated he slept well last night. Patient attends groups and eating meals. Nurse will continue to monitor. Problem: Education: Goal: Ability to make informed decisions regarding treatment will improve Outcome: Progressing   Problem: Coping: Goal: Coping ability will improve Outcome: Progressing   Problem: Self-Concept: Goal: Ability to disclose and discuss suicidal ideas will improve Outcome: Progressing Goal: Will verbalize positive feelings about self Outcome: Progressing   Problem: Coping: Goal: Ability to identify and develop effective coping behavior will improve Outcome: Progressing Goal: Ability to interact with others will improve Outcome: Progressing Goal: Demonstration of participation in decision-making regarding own care will improve Outcome: Progressing Goal: Ability to use eye contact when communicating with others will improve Outcome: Progressing

## 2018-04-12 MED ORDER — FLUVOXAMINE MALEATE 50 MG PO TABS
150.0000 mg | ORAL_TABLET | Freq: Every day | ORAL | Status: DC
  Administered 2018-04-12: 150 mg via ORAL
  Filled 2018-04-12: qty 3

## 2018-04-12 MED ORDER — RISPERIDONE 1 MG PO TABS
0.5000 mg | ORAL_TABLET | Freq: Two times a day (BID) | ORAL | Status: DC
  Administered 2018-04-12 (×2): 0.5 mg via ORAL
  Filled 2018-04-12 (×3): qty 1

## 2018-04-12 NOTE — Progress Notes (Signed)
Received Michael [name] this AM after breakfast, he was compliant with his medications. He denied all of the psychiatric symptoms this AM.  He rated depression and anxiety 8/10 and feeling hopelessness 4/10.  He is OOB in the milieu with his peers and socializing. His affect is bright this morning. He remained compliant with his medications throughout the day.

## 2018-04-12 NOTE — Progress Notes (Signed)
[facility] MD Progress Note  04/12/2018 10:52 AM Michael [name]  MRN:  [id number] Subjective: Follow-up for 37 year old man with substance abuse depression and anxiety.  Patient is complaining that he continues to have a lot of anxiety during the day.  Finds himself ruminating and worrying about things a great deal.  On the other hand he says he is sleeping well at night.  Denies any specific physical complaints today.  Denies any suicidal thought.  Continues to be optimistic about going [facility] tomorrow Principal Problem: Major depressive disorder, recurrent severe without psychotic features (HCC) Diagnosis:   Patient Active Problem List   Diagnosis Date Noted  . Major depressive disorder, recurrent severe without psychotic features (HCC) [F33.2] 04/06/2018  . Cannabis use disorder, moderate, dependence (HCC) [F12.20] 04/06/2018  . Cocaine use disorder, moderate, dependence (HCC) [F14.20] 04/06/2018  . Sedative, hypnotic or anxiolytic use disorder, severe, dependence (HCC) [F13.20] 04/06/2018  . Tobacco use disorder [F17.200] 04/06/2018   Total Time spent with patient: 20 minutes  Past Psychiatric History: Past history of depression and substance abuse  Past Medical History:  Past Medical History:  Diagnosis Date  . Sciatica    History reviewed. No pertinent surgical history. Family History: History reviewed. No pertinent family history. Family Psychiatric  History: See previous note Social History:  Social History   Substance and Sexual Activity  Alcohol Use Yes   Comment: "rarely"     Social History   Substance and Sexual Activity  Drug Use No    Social History   Socioeconomic History  . Marital status: Single    Spouse name: Not on file  . Number of children: Not on file  . Years of education: Not on file  . Highest education level: Not on file  Occupational History  . Not on file  Social Needs  . Financial resource  strain: Not on file  . Food insecurity:    Worry: Not on file    Inability: Not on file  . Transportation needs:    Medical: Not on file    Non-medical: Not on file  Tobacco Use  . Smoking status: Current Every Day Smoker    Packs/day: 0.50    Types: Cigarettes  . Smokeless tobacco: Never Used  Substance and Sexual Activity  . Alcohol use: Yes    Comment: "rarely"  . Drug use: No  . Sexual activity: Not on file  Lifestyle  . Physical activity:    Days per week: Not on file    Minutes per session: Not on file  . Stress: Not on file  Relationships  . Social connections:    Talks on phone: Not on file    Gets together: Not on file    Attends religious service: Not on file    Active member of club or organization: Not on file    Attends meetings of clubs or organizations: Not on file    Relationship status: Not on file  Other Topics Concern  . Not on file  Social History Narrative  . Not on file   Additional Social History:                         Sleep: Good  Appetite:  Fair  Current Medications: Current Facility-Administered Medications  Medication Dose Route Frequency Provider Last Rate Last Dose  . acetaminophen (TYLENOL) tablet 650 mg  650 mg Oral Q4H PRN Beverly Sessions, MD  650 mg at 04/11/18 1646  . alum & mag hydroxide-simeth (MAALOX/MYLANTA) 200-200-20 MG/5ML suspension 30 mL  30 mL Oral Q4H PRN Beverly SessionsSubedi, Jagannath, MD   30 mL at 04/10/18 0617  . cloNIDine (CATAPRES) tablet 0.1 mg  0.1 mg Oral TID Pucilowska, Jolanta B, MD   0.1 mg at 04/12/18 0821  . fluvoxaMINE (LUVOX) tablet 150 mg  150 mg Oral QHS Macee Venables T, MD      . hydrOXYzine (ATARAX/VISTARIL) tablet 50 mg  50 mg Oral QHS Pucilowska, Jolanta B, MD   50 mg at 04/11/18 2102  . ibuprofen (ADVIL,MOTRIN) tablet 600 mg  600 mg Oral Q6H PRN Pucilowska, Jolanta B, MD   600 mg at 04/09/18 2030  . magnesium hydroxide (MILK OF MAGNESIA) suspension 30 mL  30 mL Oral Daily PRN Beverly SessionsSubedi, Jagannath, MD       . nicotine (NICODERM CQ - dosed in mg/24 hours) patch 21 mg  21 mg Transdermal Daily Beverly SessionsSubedi, Jagannath, MD   21 mg at 04/12/18 0824  . ondansetron (ZOFRAN-ODT) disintegrating tablet 4 mg  4 mg Oral QID PRN Beverly SessionsSubedi, Jagannath, MD      . pantoprazole (PROTONIX) EC tablet 40 mg  40 mg Oral Daily Pucilowska, Jolanta B, MD   40 mg at 04/12/18 0824  . risperiDONE (RISPERDAL) tablet 0.5 mg  0.5 mg Oral BID AC Parrish Bonn T, MD      . risperiDONE (RISPERDAL) tablet 2 mg  2 mg Oral BID Pucilowska, Jolanta B, MD   2 mg at 04/12/18 0821  . traZODone (DESYREL) tablet 100 mg  100 mg Oral QHS Pucilowska, Jolanta B, MD   100 mg at 04/11/18 2102    Lab Results: No results found for this or any previous visit (from the past 48 hour(s)).  Blood Alcohol level:  Lab Results  Component Value Date   ETH <10 04/04/2018   ETH  12/21/2008    <5        LOWEST DETECTABLE LIMIT FOR SERUM ALCOHOL IS 5 mg/dL FOR MEDICAL PURPOSES ONLY    Metabolic Disorder Labs: No results found for: HGBA1C, MPG No results found for: PROLACTIN Lab Results  Component Value Date   CHOL 139 04/06/2018   TRIG 146 04/06/2018   HDL 37 (L) 04/06/2018   CHOLHDL 3.8 04/06/2018   VLDL 29 04/06/2018   LDLCALC 73 04/06/2018    Physical Findings: AIMS: Facial and Oral Movements Muscles of Facial Expression: None, normal Lips and Perioral Area: None, normal Jaw: None, normal Tongue: None, normal,Extremity Movements Upper (arms, wrists, hands, fingers): None, normal Lower (legs, knees, ankles, toes): None, normal, Trunk Movements Neck, shoulders, hips: None, normal, Overall Severity Severity of abnormal movements (highest score from questions above): None, normal Incapacitation due to abnormal movements: None, normal Patient's awareness of abnormal movements (rate only patient's report): No Awareness, Dental Status Current problems with teeth and/or dentures?: No Does patient usually wear dentures?: No  CIWA:  CIWA-Ar  Total: 2 COWS:  COWS Total Score: 5  Musculoskeletal: Strength & Muscle Tone: within normal limits Gait & Station: normal Patient leans: N/A  Psychiatric Specialty Exam: Physical Exam  Nursing note and vitals reviewed. Constitutional: He appears well-developed and well-nourished.  HENT:  Head: Normocephalic and atraumatic.  Eyes: Pupils are equal, round, and reactive to light. Conjunctivae are normal.  Neck: Normal range of motion.  Cardiovascular: Regular rhythm and normal heart sounds.  Respiratory: Effort normal.  GI: Soft.  Musculoskeletal: Normal range of motion.  Neurological: He is alert.  Skin: Skin is warm and dry.  Psychiatric: Judgment normal. His affect is blunt. His speech is delayed. He is slowed. Thought content is not paranoid. Cognition and memory are normal. He expresses no homicidal and no suicidal ideation.    Review of Systems  Constitutional: Negative.   HENT: Negative.   Eyes: Negative.   Respiratory: Negative.   Cardiovascular: Negative.   Gastrointestinal: Negative.   Musculoskeletal: Negative.   Skin: Negative.   Neurological: Negative.   Psychiatric/Behavioral: Negative for depression, hallucinations, memory loss, substance abuse and suicidal ideas. The patient is nervous/anxious. The patient does not have insomnia.     Blood pressure 106/77, pulse (!) 107, temperature (!) 97.4 F (36.3 C), temperature source Oral, resp. rate 19, height 5\' 6"  (1.676 m), weight 77.1 kg (170 lb), SpO2 100 %.Body mass index is 27.44 kg/m.  General Appearance: Disheveled  Eye Contact:  Fair  Speech:  Clear and Coherent and Slow  Volume:  Decreased  Mood:  Anxious and Dysphoric  Affect:  Congruent  Thought Process:  Goal Directed  Orientation:  Full (Time, Place, and Person)  Thought Content:  Logical  Suicidal Thoughts:  No  Homicidal Thoughts:  No  Memory:  Immediate;   Fair Recent;   Fair Remote;   Fair  Judgement:  Fair  Insight:  Fair  Psychomotor  Activity:  Decreased  Concentration:  Concentration: Fair  Recall:  [employer] of Knowledge:  Fair  Language:  Fair  Akathisia:  No  Handed:  Right  AIMS (if indicated):     Assets:  Communication Skills Desire for Improvement Resilience  ADL's:  Intact  Cognition:  WNL  Sleep:  Number of Hours: 7.5     Treatment Plan Summary: Daily contact with patient to assess and evaluate symptoms and progress in treatment, Medication management and Plan Reviewed patient's medication.  Clonidine was started the other day and seems to be helping with the opiate withdrawal.  Not sure if that needs to be continued for now but we will leave it in place for today.  I have increased the dose of his Luvox to 150 mg for long-term control of his mood and anxiety but I have also added 1/2 mg of Risperdal to be taken in the morning and at midday which can help with his daytime anxiety.  Patient is agreeable to the plan to go to the [facility] and drug abuse treatment Center tomorrow.  Michael Rasmussen, MD 04/12/2018, 10:52 AM

## 2018-04-12 NOTE — [facility] Group Notes (Signed)
LCSW Group Therapy Note 04/12/2018 1:15pm  Type of Therapy and Topic: Group Therapy: Feelings Around Returning Home & Establishing a Supportive Framework and Supporting Oneself When Supports Not Available  Participation Level: Active  Description of Group:  Patients first processed thoughts and feelings about upcoming discharge. These included fears of upcoming changes, lack of change, new living environments, judgements and expectations from others and overall stigma of mental health issues. The group then discussed the definition of a supportive framework, what that looks and feels like, and how do to discern it from an unhealthy non-supportive network. The group identified different types of supports as well as what to do when your family/friends are less than helpful or unavailable  Therapeutic Goals  1. Patient will identify one healthy supportive network that they can use at discharge. 2. Patient will identify one factor of a supportive framework and how to tell it from an unhealthy network. 3. Patient able to identify one coping skill to use when they do not have positive supports from others. 4. Patient will demonstrate ability to communicate their needs through discussion and/or role plays.  Summary of Patient Progress:  Patient reported he feels " sad and upset." Pt engaged during group session. As patients processed their anxiety about discharge and described healthy supports patient shared "the problem is that no matter what I don't have a place to stay and I have no choice" Pt reports he does not have a strong support system.  Patient was unable to identify healthy coping skills.   Therapeutic Modalities Cognitive Behavioral Therapy Motivational Interviewing   Melbourne Jakubiak  CUEBAS-COLON, LCSW 04/12/2018 11:11 AM

## 2018-04-12 NOTE — Plan of Care (Addendum)
Patient found in day room upon my arrival. Patient is visible and social this evening. Mood and affect continue to brighten. Processing, speech, and appearance continues to improve. Patient is cooperative and pleasant. Attended group. Patient denies all complaints including SI/HI/AVH. Denies pain. Reports eating and voiding adequately. Complains of dyspepsia, given Maalox with some relief reported. Compliant with HS medications and staff direction. Will continue to monitor throughout the shift. Patient slept 7.75 hours. No apparent distress. Will endorse care to oncoming shift.  Problem: Education: Goal: Ability to make informed decisions regarding treatment will improve Outcome: Progressing   Problem: Coping: Goal: Coping ability will improve Outcome: Progressing   Problem: Self-Concept: Goal: Ability to disclose and discuss suicidal ideas will improve Outcome: Progressing Goal: Will verbalize positive feelings about self Outcome: Progressing   Problem: Safety: Goal: Ability to remain free from injury will improve Outcome: Progressing   Problem: Coping: Goal: Ability to interact with others will improve Outcome: Progressing Goal: Ability to use eye contact when communicating with others will improve Outcome: Progressing   Problem: Education: Goal: Knowledge of the prescribed therapeutic regimen will improve Outcome: Progressing

## 2018-04-13 DIAGNOSIS — F429 Obsessive-compulsive disorder, unspecified: Secondary | ICD-10-CM | POA: Diagnosis present

## 2018-04-13 MED ORDER — RISPERIDONE 1 MG PO TABS
3.0000 mg | ORAL_TABLET | Freq: Two times a day (BID) | ORAL | Status: DC
  Filled 2018-04-13: qty 3

## 2018-04-13 NOTE — [facility] Suicide Risk Assessment (Signed)
[facility]   Principal Problem: Major depressive disorder, recurrent severe without psychotic features [facility](HCC) Discharge Diagnoses:  Patient Active Problem List   Diagnosis Date Noted  . 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 OCD (obsessive compulsive disorder) [F42.9] 04/13/2018  . Cannabis use disorder, moderate, dependence (HCC) [F12.20] 04/06/2018  . Cocaine use disorder, moderate, dependence (HCC) [F14.20] 04/06/2018  . Sedative, hypnotic or anxiolytic use disorder, severe, dependence (HCC) [F13.20] 04/06/2018  . Tobacco use disorder [F17.200] 04/06/2018    Total Time spent with patient: 20 minutes  Musculoskeletal: Strength & Muscle Tone: within normal limits Gait & Station: normal Patient leans: N/A  Psychiatric Specialty Exam: Review of Systems  Neurological: Negative.   Psychiatric/Behavioral: Positive for substance abuse.  All other systems reviewed and are negative.   Blood pressure 107/64, pulse (!) 107, temperature 97.7 F (36.5 C), temperature source Oral, resp. rate 18, height 5\' 6"  (1.676 m), weight 77.1 kg (170 lb), SpO2 100 %.Body mass index is 27.44 kg/m.  General Appearance: Casual  Eye Contact::  Good  Speech:  Clear and Coherent409  Volume:  Normal  Mood:  Anxious  Affect:  Appropriate  Thought Process:  Goal Directed and Descriptions of Associations: Intact  Orientation:  Full (Time, Place, and Person)  Thought Content:  WDL  Suicidal Thoughts:  No  Homicidal Thoughts:  No  Memory:  Immediate;   Fair Recent;   Fair Remote;   Fair  Judgement:  Poor  Insight:  Shallow  Psychomotor Activity:  Normal  Concentration:  Fair  Recall:  [employer]  Fund of Knowledge:Fair  Language: Fair  Akathisia:  No  Handed:  Right  AIMS (if indicated):     Assets:  Communication Skills Desire for Improvement Physical Health Resilience  Sleep:  Number of Hours:  7.75  Cognition: WNL  ADL's:  Intact   Mental Status Per Nursing Assessment::   On Admission:  Self-harm thoughts  Demographic Factors:  Male, Caucasian, Low socioeconomic status and Unemployed  Loss Factors: Decrease in vocational status and Financial problems/change in socioeconomic status  Historical Factors: Prior suicide attempts, Family history of mental illness or substance abuse and Impulsivity  Risk Reduction Factors:   Sense of responsibility to family  Continued Clinical Symptoms:  Depression:   Comorbid alcohol abuse/dependence Impulsivity Alcohol/Substance Abuse/Dependencies Obsessive-Compulsive Disorder  Cognitive Features That Contribute To Risk:  None    Suicide Risk:  Minimal: No identifiable suicidal ideation.  Patients presenting with no risk factors but with morbid ruminations; may be classified as minimal risk based on the severity of the depressive symptoms  Follow-up Information    [facility]. Go on [facility]/2018.   Specialty:  Behavioral Health Why:  Please attend your intake appointment on Monday April 13, 2018 at 10am. Thank you. Contact information: [address] [place] [phone]          Plan Of Care/Follow-up recommendations:  Activity:  as tolerated Diet:  low sodium heart healthy Other:  keep follow up appointments  Kristine LineaJolanta Tirso Laws, MD 04/13/2018, 8:29 AM

## 2018-04-13 NOTE — Progress Notes (Signed)
  [facility] Discharge [facility] :  Will you be returning to the same living situation after discharge:  No. At discharge, do you have transportation home?: Yes,  Taxi to [facility] Do you have the ability to pay for your medications: Yes,  Referred to a provider who can assist  Release of information consent forms completed and in the chart;  Patient's signature needed at discharge.  Patient to Follow up at: Follow-up Information    [facility]. Go on 04/13/2018.   Specialty:  Behavioral Health Why:  Please attend your intake appointment on Monday April 13, 2018 at 10am. Thank you. Contact information: [address] [place] [phone]          Next level of care provider [facility] Link:no  Safety Planning and Suicide Prevention discussed: Yes,  Completed with patient  Have you used any form of tobacco in the last 30 days? (Cigarettes, Smokeless Tobacco, Cigars, and/or Pipes): Yes  Has patient been referred to the Quitline?: Patient refused referral  Patient has been referred for addiction treatment: Yes  Johny ShearsCassandra  Hilari Wethington, LCSW 04/13/2018, 8:11 AM

## 2018-04-13 NOTE — Progress Notes (Signed)
Michael [name] received his discharge order, the AVS was reviewed and his questions answered. His personal belongings returned and he received his morning medications. He was discharge via taxi to [facility] without incident.

## 2018-04-13 NOTE — Progress Notes (Signed)
Recreation Therapy Notes  INPATIENT RECREATION TR PLAN  Patient Details Name: Michael [name] MRN: [id number] DOB: 07/27/1981 Today's Date: 04/13/2018  Rec Therapy Plan Is patient appropriate for Therapeutic Recreation?: Yes Treatment times per week: at least 3 Estimated Length of Stay: 5-7 days TR Treatment/Interventions: Group participation (Comment)  Discharge Criteria Pt will be discharged from therapy if:: Discharged Treatment plan/goals/alternatives discussed and agreed upon by:: Patient/family  Discharge Summary Short term goals set: Patient will identify 3 triggers for substance use within 5 recreation therapy group sessions Short term goals met: Adequate for discharge Progress toward goals comments: Groups attended Which groups?: AAA/T, Other (Comment)(Team work, Happiness) Reason goals not met: N/A Therapeutic equipment acquired: N/A Reason patient discharged from therapy: Discharge from hospital Pt/family agrees with progress & goals achieved: Yes Date patient discharged from therapy: 04/13/18      04/13/2018, 2:46 PM  

## 2018-04-13 NOTE — Discharge Summary (Signed)
Physician Discharge Summary Note  Patient:  Michael [name] is an 37 y.o., male MRN:  [id number] DOB:  Apr 13, 1981 Patient phone:  [phone] (home)  Patient address:   [address] [address],  Total Time spent with patient: 20 minutes plus 15 min on care coordination and documentation  Date of Admission:  04/06/2018 Date of Discharge: 04/13/2018  Reason for Admission:  Suicidal ideation.  History of Present Illness:   Identifying data. Michael [name] is a 37 year old male with a history of depression, anxiety and substance abuse.  Chief complaint. "I just don't want to live."  History of present illness. Information was obtained from the patient and the chart. The patient came to [facility] complaining of suicidal ideation with a plan to step in front of traffic. He has been under considerable stress after loosing his job and housing due to substance use. He decided to ask for help with "chemical imbalance" and substance abuse. He reports many symptoms of depression with poor sleep, decreased appetite, anhedonia, feeling of guilt hopelessness worthlessness, poor energy and concentration, social isolation, crying spells, heightened anxiety that culminated in suicidal thinking. He reports paranoia, voice in his head that is "his conscious". He reports panic attacks, "everytime I think of the future", and OCD symptoms. He uses multiple drugs. Heroine is his first choice. He does not drink alcohol.   Past psychiatric history. Depressed and suicidal all his life. One suicide attempt by hanging at the age of 49 for which he was hospitalized and received therapy. He does not remember names of medications given. Long history of substance abuse with one rehab at [facility] 15 or so years ago.   Family psychiatric history. Brother with panic disorder.  Social history. Dropped out of school at 18 but did get GEDs. He has has multiple jobs over the years, unable to keep one. For  the past 8 years, he has been living with his "ex-old lady" and her father but the father kicked him out. He briefly moved with his mother but was also kicked out.   Principal Problem: Major depressive disorder, recurrent severe without psychotic features [facility]) Discharge Diagnoses: Patient Active Problem List   Diagnosis Date Noted  . 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 OCD (obsessive compulsive disorder) [F42.9] 04/13/2018  . Cannabis use disorder, moderate, dependence (HCC) [F12.20] 04/06/2018  . Cocaine use disorder, moderate, dependence (HCC) [F14.20] 04/06/2018  . Sedative, hypnotic or anxiolytic use disorder, severe, dependence (HCC) [F13.20] 04/06/2018  . Tobacco use disorder [F17.200] 04/06/2018     Past Medical History:  Past Medical History:  Diagnosis Date  . Sciatica    History reviewed. No pertinent surgical history. Family History: History reviewed. No pertinent family history.  Social History:  Social History   Substance and Sexual Activity  Alcohol Use Yes   Comment: "rarely"     Social History   Substance and Sexual Activity  Drug Use No    Social History   Socioeconomic History  . Marital status: Single    Spouse name: Not on file  . Number of children: Not on file  . Years of education: Not on file  . Highest education level: Not on file  Occupational History  . Not on file  Social Needs  . Financial resource strain: Not on file  . Food insecurity:    Worry: Not on file    Inability: Not on file  .  Transportation needs:    Medical: Not on file    Non-medical: Not on file  Tobacco Use  . Smoking status: Current Every Day Smoker    Packs/day: 0.50    Types: Cigarettes  . Smokeless tobacco: Never Used  Substance and Sexual Activity  . Alcohol use: Yes    Comment: "rarely"  . Drug use: No  . Sexual activity: Not on file  Lifestyle  . Physical activity:     Days per week: Not on file    Minutes per session: Not on file  . Stress: Not on file  Relationships  . Social connections:    Talks on phone: Not on file    Gets together: Not on file    Attends religious service: Not on file    Active member of club or organization: Not on file    Attends meetings of clubs or organizations: Not on file    Relationship status: Not on file  Other Topics Concern  . Not on file  Social History Narrative  . Not on file    Hospital Course:   Michael [name] is a 37 year old male with a history of depression and substance abuse admitted for suicidal ideation with a plan in the context of substance use and severe social stressors. He was started on medications which he tolerated well. At the time o dicharge, he is no longer suicidal. He is able to contract for safety. He is forward thinking and optimistic about the future.  #Mood/anxiety, improved -continueLuvox 150 mg nightly -continue Risperdal 3 mg BID  #Insomnia, improved -Trazodone100 mg nightlywith Vistaril50 mg  #Substance abuse, no symptoms of withdrawal -positive for cocaine, cannabis, benzodiazepine, admits to heroine as well, no alcohol -received symptomatic treatment with Clonidine  #GERD -Protonix 40 mg daily  #Labs -lipid panel, TSH and A1Care normal -EKG reviewed, NSR with QTc 434  #Shoulder pain -Xray negative -Motrin 600 mg PRN  #Substance abuse treatment -accepted to [facility]  #Disposition -discharge to [facility]   Physical Findings: AIMS: Facial and Oral Movements Muscles of Facial Expression: None, normal Lips and Perioral Area: None, normal Jaw: None, normal Tongue: None, normal,Extremity Movements Upper (arms, wrists, hands, fingers): None, normal Lower (legs, knees, ankles, toes): None, normal, Trunk Movements Neck, shoulders, hips: None, normal, Overall Severity Severity of abnormal movements (highest score from questions above): None,  normal Incapacitation due to abnormal movements: None, normal Patient's awareness of abnormal movements (rate only patient's report): No Awareness, Dental Status Current problems with teeth and/or dentures?: No Does patient usually wear dentures?: No  CIWA:  CIWA-Ar Total: 2 COWS:  COWS Total Score: 1  Musculoskeletal: Strength & Muscle Tone: within normal limits Gait & Station: normal Patient leans: N/A  Psychiatric Specialty Exam: Physical Exam  Nursing note and vitals reviewed. Psychiatric: He has a normal mood and affect. His speech is normal and behavior is normal. Judgment and thought content normal. Cognition and memory are normal.    Review of Systems  Neurological: Negative.   Psychiatric/Behavioral: Positive for substance abuse.  All other systems reviewed and are negative.   Blood pressure 107/64, pulse (!) 107, temperature 97.7 F (36.5 C), temperature source Oral, resp. rate 18, height 5\' 6"  (1.676 m), weight 77.1 kg (170 lb), SpO2 100 %.Body mass index is 27.44 kg/m.  General Appearance: Casual  Eye Contact:  Good  Speech:  Clear and Coherent  Volume:  Normal  Mood:  Anxious  Affect:  Appropriate  Thought Process:  Goal Directed and Descriptions of  Associations: Intact  Orientation:  Full (Time, Place, and Person)  Thought Content:  WDL  Suicidal Thoughts:  No  Homicidal Thoughts:  No  Memory:  Immediate;   Fair Recent;   Fair Remote;   Fair  Judgement:  Poor  Insight:  Shallow  Psychomotor Activity:  Normal  Concentration:  Concentration: Fair and Attention Span: Fair  Recall:  [employer]Fair  Fund of Knowledge:  Fair  Language:  Fair  Akathisia:  No  Handed:  Right  AIMS (if indicated):     Assets:  Communication Skills Desire for Improvement Physical Health Resilience  ADL's:  Intact  Cognition:  WNL  Sleep:  Number of Hours: 7.75     Have you used any form of tobacco in the last 30 days? (Cigarettes, Smokeless Tobacco, Cigars, and/or Pipes): Yes   Has this patient used any form of tobacco in the last 30 days? (Cigarettes, Smokeless Tobacco, Cigars, and/or Pipes) Yes, Yes, A prescription for an FDA-approved tobacco cessation medication was offered at discharge and the patient refused  Blood Alcohol level:  Lab Results  Component Value Date   ETH <10 04/04/2018   [facility]    <5        LOWEST DETECTABLE LIMIT FOR SERUM ALCOHOL IS 5 mg/dL FOR MEDICAL PURPOSES ONLY    Metabolic Disorder Labs:  No results found for: HGBA1C, MPG No results found for: PROLACTIN Lab Results  Component Value Date   CHOL 139 04/06/2018   TRIG 146 04/06/2018   HDL 37 (L) 04/06/2018   CHOLHDL 3.8 04/06/2018   VLDL 29 04/06/2018   LDLCALC 73 04/06/2018    See Psychiatric Specialty Exam and Suicide Risk Assessment completed by Attending Physician prior to discharge.  Discharge destination:  [facility]  Is patient on multiple antipsychotic therapies at discharge:  No   Has Patient had three or more failed trials of antipsychotic monotherapy by history:  No  Recommended Plan for Multiple Antipsychotic Therapies: NA  Discharge Instructions    Diet - low sodium heart healthy   Complete by:  As directed    Increase activity slowly   Complete by:  As directed      Allergies as of 04/13/2018   No Known Allergies     Medication List    STOP taking these medications   acetaminophen-codeine 300-30 MG tablet Commonly known as:  TYLENOL #3      Follow-up Information    [facility]. Go on 04/13/2018.   Specialty:  Behavioral Health Why:  Please attend your intake appointment on Monday April 13, 2018 at 10am. Thank you. Contact information: [address] [place] [id number] [phone]          Follow-up recommendations:  Activity:  as tolerated Diet:  low sodium heart healthy Other:  keep follow up appointments  Comments:    Signed: Kristine LineaJolanta Havannah Streat, MD 04/13/2018, 8:30 AM

## 2018-08-19 ENCOUNTER — Emergency Department (HOSPITAL_COMMUNITY)
Admission: EM | Admit: 2018-08-19 | Discharge: 2018-08-21 | Disposition: A | Discharge status: Home / Self Care | Payer: Self-pay | Attending: Emergency Medicine | Admitting: Emergency Medicine

## 2018-08-19 ENCOUNTER — Encounter (HOSPITAL_COMMUNITY): Payer: Self-pay | Admitting: Emergency Medicine

## 2018-08-19 ENCOUNTER — Other Ambulatory Visit: Payer: Self-pay

## 2018-08-19 DIAGNOSIS — F333 Major depressive disorder, recurrent, severe with psychotic symptoms: Secondary | ICD-10-CM | POA: Insufficient documentation

## 2018-08-19 DIAGNOSIS — F1910 Other psychoactive substance abuse, uncomplicated: Secondary | ICD-10-CM | POA: Insufficient documentation

## 2018-08-19 DIAGNOSIS — F1420 Cocaine dependence, uncomplicated: Secondary | ICD-10-CM | POA: Insufficient documentation

## 2018-08-19 DIAGNOSIS — F1120 Opioid dependence, uncomplicated: Secondary | ICD-10-CM | POA: Insufficient documentation

## 2018-08-19 DIAGNOSIS — F17210 Nicotine dependence, cigarettes, uncomplicated: Secondary | ICD-10-CM | POA: Insufficient documentation

## 2018-08-19 DIAGNOSIS — R45851 Suicidal ideations: Secondary | ICD-10-CM | POA: Insufficient documentation

## 2018-08-19 HISTORY — DX: Unspecified dislocation of unspecified shoulder joint, initial encounter: S43.006A

## 2018-08-19 LAB — RAPID URINE DRUG SCREEN, HOSP PERFORMED
AMPHETAMINES: NOT DETECTED
BARBITURATES: NOT DETECTED
BENZODIAZEPINES: NOT DETECTED
Cocaine: POSITIVE — AB
Opiates: POSITIVE — AB
TETRAHYDROCANNABINOL: NOT DETECTED

## 2018-08-19 LAB — COMPREHENSIVE METABOLIC PANEL
ALT: 15 U/L (ref 0–44)
AST: 19 U/L (ref 15–41)
Albumin: 4.1 g/dL (ref 3.5–5.0)
Alkaline Phosphatase: 48 U/L (ref 38–126)
Anion gap: 9 (ref 5–15)
BILIRUBIN TOTAL: 0.7 mg/dL (ref 0.3–1.2)
BUN: 13 mg/dL (ref 6–20)
CALCIUM: 9.2 mg/dL (ref 8.9–10.3)
CO2: 24 mmol/L (ref 22–32)
CREATININE: 1.06 mg/dL (ref 0.61–1.24)
Chloride: 105 mmol/L (ref 98–111)
GFR calc non Af Amer: >60 mL/min (ref >60)
Glucose, Bld: 84 mg/dL (ref 70–99)
Potassium: 3.9 mmol/L (ref 3.5–5.1)
Sodium: 138 mmol/L (ref 135–145)
TOTAL PROTEIN: 7.1 g/dL (ref 6.5–8.1)

## 2018-08-19 LAB — SALICYLATE LEVEL: Salicylate Lvl: <7 mg/dL (ref 2.8–30.0)

## 2018-08-19 LAB — CBC
HCT: 46.2 % (ref 39.0–52.0)
HEMOGLOBIN: 15.6 g/dL (ref 13.0–17.0)
MCH: 30.6 pg (ref 26.0–34.0)
MCHC: 33.8 g/dL (ref 30.0–36.0)
MCV: 90.6 fL (ref 80.0–100.0)
NRBC: 0 % (ref 0.0–0.2)
PLATELETS: 256 10*3/uL (ref 150–400)
RBC: 5.1 MIL/uL (ref 4.22–5.81)
RDW: 12.3 % (ref 11.5–15.5)
WBC: 8.3 10*3/uL (ref 4.0–10.5)

## 2018-08-19 LAB — ACETAMINOPHEN LEVEL

## 2018-08-19 LAB — ETHANOL

## 2018-08-19 MED ORDER — NICOTINE 21 MG/24HR TD PT24
21.0000 mg | MEDICATED_PATCH | Freq: Every day | TRANSDERMAL | Status: DC
  Administered 2018-08-19 – 2018-08-21 (×3): 21 mg via TRANSDERMAL
  Filled 2018-08-19 (×4): qty 1

## 2018-08-19 MED ORDER — ZOLPIDEM TARTRATE 5 MG PO TABS
5.0000 mg | ORAL_TABLET | Freq: Every evening | ORAL | Status: DC | PRN
  Administered 2018-08-19 – 2018-08-20 (×2): 5 mg via ORAL
  Filled 2018-08-19 (×3): qty 1

## 2018-08-19 NOTE — ED Provider Notes (Addendum)
[facility] EMERGENCY DEPARTMENT Provider Note   CSN: [id number] Arrival date & time: 08/19/18  1614     History   Chief Complaint Chief Complaint  Patient presents with  . V70.1    HPI Michael [name] is a 37 y.o. male.  HPI  Pt was seen at 1740.  Per pt, c/o gradual onset and worsening of persistent SI for the past week. Pt states he lost his place to stay and has been using drugs since he was discharged from rehab several months ago. Pt states he is again using heroin and crack. Denies SA, no HI, no AVH.   Past Medical History:  Diagnosis Date  . Drug use   . Sciatica   . Shoulder dislocation     Patient Active Problem List   Diagnosis Date Noted  . OCD (obsessive compulsive disorder) 04/13/2018  . Major depressive disorder, recurrent severe without psychotic features (HCC) 04/06/2018  . Cannabis use disorder, moderate, dependence (HCC) 04/06/2018  . Cocaine use disorder, moderate, dependence (HCC) 04/06/2018  . Sedative, hypnotic or anxiolytic use disorder, severe, dependence (HCC) 04/06/2018  . Tobacco use disorder 04/06/2018    Past Surgical History:  Procedure Laterality Date  . ELBOW FRACTURE SURGERY          Home Medications    Prior to Admission medications   Not on File    Family History History reviewed. No pertinent family history.  Social History Social History   Tobacco Use  . Smoking status: Current Every Day Smoker    Packs/day: 1.00    Types: Cigarettes  . Smokeless tobacco: Never Used  Substance Use Topics  . Alcohol use: Yes    Comment: "rarely"  . Drug use: Yes    Types: IV    Comment: Heroin, Crack yesterday     Allergies   Patient has no known allergies.   Review of Systems Review of Systems ROS: Statement: All systems negative except as marked or noted in the HPI; Constitutional: Negative for fever and chills. ; ; Eyes: Negative for eye pain, redness and discharge. ; ; ENMT: Negative for ear pain,  hoarseness, nasal congestion, sinus pressure and sore throat. ; ; Cardiovascular: Negative for chest pain, palpitations, diaphoresis, dyspnea and peripheral edema. ; ; Respiratory: Negative for cough, wheezing and stridor. ; ; Gastrointestinal: Negative for nausea, vomiting, diarrhea, abdominal pain, blood in stool, hematemesis, jaundice and rectal bleeding. . ; ; Genitourinary: Negative for dysuria, flank pain and hematuria. ; ; Musculoskeletal: Negative for back pain and neck pain. Negative for swelling and trauma.; ; Skin: Negative for pruritus, rash, abrasions, blisters, bruising and skin lesion.; ; Neuro: Negative for headache, lightheadedness and neck stiffness. Negative for weakness, altered level of consciousness, altered mental status, extremity weakness, paresthesias, involuntary movement, seizure and syncope.; Psych:  +SI, no SA, no HI, no hallucinations.        Physical Exam Updated Vital Signs BP 104/70 (BP Location: Right Arm)   Pulse 76   Temp 98.9 F (37.2 C) (Oral)   Resp 20   Ht 5\' 6"  (1.676 m)   Wt 79.4 kg   SpO2 100%   BMI 28.25 kg/m   Physical Exam 1745: Physical examination:  Nursing notes reviewed; Vital signs and O2 SAT reviewed;  Constitutional: Well developed, Well nourished, Well hydrated, In no acute distress; Head:  Normocephalic, atraumatic; Eyes: EOMI, PERRL, No scleral icterus; ENMT: Mouth and pharynx normal, Mucous membranes moist; Neck: Supple, Full range of motion; Cardiovascular: Regular rate  and rhythm; Respiratory: Breath sounds clear, No wheezes.  Speaking full sentences with ease, Normal respiratory effort/excursion; Chest: No deformity, Movement normal; Abdomen: Nondistended; Extremities: No deformity.; Neuro: AA&Ox3, Major CN grossly intact.  Speech clear. No gross focal motor deficits in extremities. Climbs on and off stretcher easily by himself. Gait steady.; Skin: Color normal, Warm, Dry.; Psych:  Affect full. Endorses SI.    ED Treatments /  Results  Labs (all labs ordered are listed, but only abnormal results are displayed)   EKG None  Radiology    Procedures Procedures (including critical care time)  Medications Ordered in ED Medications - No data to display   Initial Impression / Assessment and Plan / ED Course  I have reviewed the triage vital signs and the nursing notes.  Pertinent labs & imaging results that were available during my care of the patient were reviewed by me and considered in my medical decision making (see chart for details).  MDM Reviewed: previous chart, nursing note and vitals Reviewed previous: labs Interpretation: labs   Results for orders placed or performed during the hospital encounter of 08/19/18  Comprehensive metabolic panel  Result Value Ref Range   Sodium 138 135 - 145 mmol/L   Potassium 3.9 3.5 - 5.1 mmol/L   Chloride 105 98 - 111 mmol/L   CO2 24 22 - 32 mmol/L   Glucose, Bld 84 70 - 99 mg/dL   BUN 13 6 - 20 mg/dL   Creatinine, Ser 1.61 0.61 - 1.24 mg/dL   Calcium 9.2 8.9 - 09.6 mg/dL   Total Protein 7.1 6.5 - 8.1 g/dL   Albumin 4.1 3.5 - 5.0 g/dL   AST 19 15 - 41 U/L   ALT 15 0 - 44 U/L   Alkaline Phosphatase 48 38 - 126 U/L   Total Bilirubin 0.7 0.3 - 1.2 mg/dL   GFR calc non Af Amer >60 >60 mL/min   GFR calc Af Amer >60 >60 mL/min   Anion gap 9 5 - 15  Ethanol  Result Value Ref Range   Alcohol, Ethyl (B) <10 <10 mg/dL  cbc  Result Value Ref Range   WBC 8.3 4.0 - 10.5 K/uL   RBC 5.10 4.22 - 5.81 MIL/uL   Hemoglobin 15.6 13.0 - 17.0 g/dL   HCT 04.5 40.9 - 81.1 %   MCV 90.6 80.0 - 100.0 fL   MCH 30.6 26.0 - 34.0 pg   MCHC 33.8 30.0 - 36.0 g/dL   RDW 91.4 78.2 - 95.6 %   Platelets 256 150 - 400 K/uL   nRBC 0.0 0.0 - 0.2 %  Rapid urine drug screen (hospital performed)  Result Value Ref Range   Opiates POSITIVE (A) NONE DETECTED   Cocaine POSITIVE (A) NONE DETECTED   Benzodiazepines NONE DETECTED NONE DETECTED   Amphetamines NONE DETECTED NONE DETECTED     Tetrahydrocannabinol NONE DETECTED NONE DETECTED   Barbiturates NONE DETECTED NONE DETECTED  Salicylate level  Result Value Ref Range   Salicylate Lvl <7.0 2.8 - 30.0 mg/dL  Acetaminophen level  Result Value Ref Range   Acetaminophen (Tylenol), Serum <10 (L) 10 - 30 ug/mL    1840:  Will have TTS evaluate. Holding orders written.   2140:  TTS has evaluated pt: states pt will need re-evaluation tomorrow morning, hold in ED overnight.     Final Clinical Impressions(s) / ED Diagnoses   Final diagnoses:  None    ED Discharge Orders    None  Samuel Jester, DO 08/19/18 2144

## 2018-08-19 NOTE — BH Assessment (Signed)
Tele Assessment Note   Patient Name: Michael [name] MRN: [id number] Referring Physician: Dr. Clarene Duke Location of Patient: [facility]  Location of Provider: [facility]  Michael [name] is an 37 y.o., single male. Pt presented to [facility] voluntarily, alone due to report of SI. Pt stated that he relapsed on Crack-Cocaine and Heroin 3 weeks ago after being clean for 3 months. Pt stated that his girlfriend decided to kick him out of the house due to his use. Pt reports that he has been having thoughts of suicide for several weeks. Pt endorsed sadness, issues sleeping, lack of appetite, guilt and lack of motivation. Pt reports that he is also persistently anxious without a precipitating reason. Pt reports seeing ghosts and hearing voices at times. Pt reports daily use of crack and heroin for the past 3 weeks. Pt reports that he received treatment at [facility] in [place], [place] 3 months ago for 2 weeks and stated that he has been in an [place] program at [facility] in [place], [place]. Pt reports participating in the program until last week. Pt asked several questions about how to get Suboxone, Xanax and Valium. Pt reports that he needs the medications to help him stop using. Pt denied HI.   Pt reports being homeless since yesterday and staying with a friend last night. Pt reports no supports. Pt reports being unemployed and having no insurance. Pt denies legal hx and probation. Pt reports that he has never been married. Pt reports hx of sciatic pain.   Pt oriented to person, place, time and situation. Pt presented alert, dressed appropriately and groomed. Pt spoke clearly, coherently and did not seem to be under the influence of any substances currently. Pt reported withdrawal symptoms. Pt made good eye contact and answered questions appropriately. Pt presented euthymic, calm and open to the assessment process. Pt presented with no impairments of remote or recent memory.     Diagnosis:  F14.20  Cocaine use disorder, Severe F11.20 Opioid use disorder, Moderate F33.3 Major depressive disorder, Recurrent episode, With psychotic features   Past Medical History:  Past Medical History:  Diagnosis Date  . Drug use   . Sciatica   . Shoulder dislocation     Past Surgical History:  Procedure Laterality Date  . ELBOW FRACTURE SURGERY      Family History: History reviewed. No pertinent family history.  Social History:  reports that he has been smoking cigarettes. He has been smoking about 1.00 pack per day. He has never used smokeless tobacco. He reports that he drinks alcohol. He reports that he has current or past drug history. Drug: IV.  Additional Social History:  Alcohol / Drug Use Pain Medications: See MAR.  Prescriptions: Pt reports that he was prescribed a medication for sleep and depression.  Over the Counter: See MAR.  History of alcohol / drug use?: Yes Longest period of sobriety (when/how long): Pt reports 4 months.  Negative Consequences of Use: Financial, Legal, Personal relationships, Work / School Substance #1 Name of Substance 1: Crack-Cocaine  1 - Age of First Use: 21 1 - Amount (size/oz): Up to $1000 [place]  1 - Frequency: Daily  1 - Duration: 16 Years 1 - Last Use / Amount: 08/17/2018 Substance #2 Name of Substance 2: Heroin  2 - Age of First Use: 35 2 - Amount (size/oz): $40 Worth  2 - Frequency: "I've only used a couple times."  2 - Duration: Pt reports very recent use.  2 - Last Use /  Amount: 08/17/2018  CIWA: CIWA-Ar BP: 104/70 Pulse Rate: 76 COWS:    Allergies: No Known Allergies  Home Medications:  (Not in a hospital admission)  OB/GYN Status:  No LMP for male patient.  General Assessment Data [facility] of Assessment: [facility] TTS Assessment: In system Is this a Tele or Face-to-Face Assessment?: Tele Assessment Is this an Initial Assessment or a Re-assessment for this encounter?: Initial Assessment Patient Accompanied by:: Other(Pt  alone. ) Language Other than English: No Living Arrangements: Homeless/Shelter What gender do you identify as?: Male Marital status: Single Maiden name: N/A Pregnancy Status: No Living Arrangements: Alone Can pt return to current living arrangement?: Yes Admission Status: Voluntary Is patient capable of signing voluntary admission?: Yes Referral Source: Self/Family/Friend Insurance type: Self Pay   Medical Screening Exam [facility] Walk-in ONLY) Medical Exam completed: Yes  Crisis Care Plan Living Arrangements: Alone Legal Guardian: Other:(Self) Name of Psychiatrist: Arna Medici  Name of Therapist: Arna Medici   Education Status Is patient currently in school?: No Is the patient employed, unemployed or receiving disability?: Unemployed  Risk to self with the past 6 months Suicidal Ideation: Yes-Currently Present Has patient been a risk to self within the past 6 months prior to admission? : Yes Suicidal Intent: Yes-Currently Present Has patient had any suicidal intent within the past 6 months prior to admission? : Yes Is patient at risk for suicide?: Yes Suicidal Plan?: Yes-Currently Present Has patient had any suicidal plan within the past 6 months prior to admission? : Yes Specify Current Suicidal Plan: Overdose on Heroin Access to Means: Yes Specify Access to Suicidal Means: Buy Heroin on the street.  What has been your use of drugs/alcohol within the last 12 months?: Pt reports Crack-Cocaine and Heroin use.  Previous Attempts/Gestures: Yes How many times?: 1 Other Self Harm Risks: Pt denied.  Triggers for Past Attempts: None known Intentional Self Injurious Behavior: Cutting(Pt reports cutting wrists in the past. ) Comment - Self Injurious Behavior: Pt reports cutting wrists in the past.  Family Suicide History: Yes Recent stressful life event(s): Other (Comment)(PT reports his girlfriend put him out. ) Persecutory voices/beliefs?: No Depression:  Yes Depression Symptoms: Despondent, Insomnia, Feeling worthless/self pity, Feeling angry/irritable, Guilt Substance abuse history and/or treatment for substance abuse?: Yes Suicide prevention information given to non-admitted patients: Not applicable  Risk to Others within the past 6 months Homicidal Ideation: No Does patient have any lifetime risk of violence toward others beyond the six months prior to admission? : No Thoughts of Harm to Others: No Current Homicidal Intent: No Current Homicidal Plan: No Access to Homicidal Means: No Identified Victim: Denied.  History of harm to others?: No Assessment of Violence: None Noted Violent Behavior Description: Denied.  Does patient have access to weapons?: No Criminal Charges Pending?: No Does patient have a court date: No Is patient on probation?: No  Psychosis Hallucinations: Visual, Auditory(Pt reports seeing ghosts and hearing voices. ) Delusions: None noted  Mental Status Report Appearance/Hygiene: In scrubs, Unremarkable Eye Contact: Good Motor Activity: Unremarkable Speech: Logical/coherent Level of Consciousness: Alert Mood: Empty Affect: Flat Anxiety Level: None Thought Processes: Relevant, Coherent Judgement: Impaired Orientation: Person, Place, Time, Situation, Appropriate for developmental age Obsessive Compulsive Thoughts/Behaviors: None  Cognitive Functioning Concentration: Normal Memory: Recent Intact, Remote Intact Is patient IDD: No Insight: Fair Impulse Control: Poor Appetite: Poor Have you had any weight changes? : Loss Amount of the weight change? (lbs): 10 lbs(Pt reports losing 10 pounds in one week. ) Sleep: Decreased Total Hours  of Sleep: 5 Vegetative Symptoms: None  ADLScreening [facility] Assessment Services) Patient's cognitive ability adequate to safely complete daily activities?: Yes Patient able to express need for assistance with ADLs?: Yes Independently performs ADLs?: Yes (appropriate for  developmental age)  Prior Inpatient Therapy Prior Inpatient Therapy: Yes Prior Therapy Dates: 38 years old Prior Therapy Facilty/Provider(s): [place] [place] Reason for Treatment: Drug Abuse; Suicide Attempt   Prior Outpatient Therapy Prior Outpatient Therapy: Yes Prior Therapy Dates: 2019 [facility]  Reason for Treatment: Cocaine and Heroin  Does patient have an ACCT team?: No Does patient have Intensive In-House Services?  : No Does patient have [facility] services? : No Does patient have P4CC services?: No  ADL Screening (condition at time of admission) Patient's cognitive ability adequate to safely complete daily activities?: Yes Is the patient deaf or have difficulty hearing?: No Does the patient have difficulty seeing, even when wearing glasses/contacts?: No Does the patient have difficulty concentrating, remembering, or making decisions?: No Patient able to express need for assistance with ADLs?: Yes Does the patient have difficulty dressing or bathing?: No Independently performs ADLs?: Yes (appropriate for developmental age) Does the patient have difficulty walking or climbing stairs?: No Weakness of Legs: None Weakness of Arms/Hands: None  Home Assistive Devices/Equipment Home Assistive Devices/Equipment: None  Therapy Consults (therapy consults require a physician order) PT Evaluation Needed: No OT Evalulation Needed: No SLP Evaluation Needed: No Abuse/Neglect Assessment (Assessment to be complete while patient is alone) Abuse/Neglect Assessment Can Be Completed: Yes Physical Abuse: Denies Verbal Abuse: Denies Sexual Abuse: Denies Exploitation of patient/patient's resources: Denies Self-Neglect: Denies Values / Beliefs Cultural Requests During Hospitalization: None Spiritual Requests During Hospitalization: None Consults Spiritual Care Consult Needed: No Social Work Consult Needed: No Merchant navy officer (For  Healthcare) Does Patient Have a Medical Advance Directive?: No Would patient like information on creating a medical advance directive?: No - Patient declined          Disposition: Per Donell Sievert, PA; Pt to be observed over night due to suicidal ideations.  Disposition Initial Assessment Completed for this Encounter: Yes  This service was provided via telemedicine using a 2-way, interactive audio and video technology.  Names of all persons participating in this telemedicine service and their role in this encounter. Name: Verl Blalock  Role: Patient   Name: Chesley Noon Role: Clinician   Name: Role:   Name:  Role:    Chesley Noon, M.S., [facility], LCAS Triage Specialist [facility]  08/19/2018 9:37 PM

## 2018-08-19 NOTE — Progress Notes (Signed)
Charge Nurse; Casimiro Needle informed of pt disposition.

## 2018-08-19 NOTE — ED Triage Notes (Signed)
PT states he lost his place to stay (with girlfriend) due to his drug addiction (heroin/crack) and has been having suicidal thoughts over the past week.

## 2018-08-19 NOTE — ED Notes (Signed)
Pt changed into BH burgundy scrubs. All pt's belongings placed in EMS locker room. Pt has a blue duffle bag and one pt belonging bag in EMS locker room.

## 2018-08-20 ENCOUNTER — Encounter (HOSPITAL_COMMUNITY): Payer: Self-pay | Admitting: Registered Nurse

## 2018-08-20 MED ORDER — METHOCARBAMOL 500 MG PO TABS: 500.0000 mg | ORAL_TABLET | Freq: Three times a day (TID) | ORAL | Status: DC | PRN

## 2018-08-20 MED ORDER — ONDANSETRON 4 MG PO TBDP: 4.0000 mg | ORAL_TABLET | Freq: Four times a day (QID) | ORAL | Status: DC | PRN

## 2018-08-20 MED ORDER — CLONIDINE HCL 0.1 MG PO TABS: 0.1000 mg | ORAL_TABLET | Freq: Every day | ORAL | Status: DC

## 2018-08-20 MED ORDER — NAPROXEN 250 MG PO TABS: 500.0000 mg | ORAL_TABLET | Freq: Two times a day (BID) | ORAL | Status: DC | PRN

## 2018-08-20 MED ORDER — LOPERAMIDE HCL 2 MG PO CAPS: 2.0000 mg | ORAL_CAPSULE | ORAL | Status: DC | PRN

## 2018-08-20 MED ORDER — HYDROXYZINE HCL 25 MG PO TABS
25.0000 mg | ORAL_TABLET | Freq: Four times a day (QID) | ORAL | Status: DC | PRN
  Administered 2018-08-20 – 2018-08-21 (×2): 25 mg via ORAL
  Filled 2018-08-20 (×2): qty 1

## 2018-08-20 MED ORDER — CLONIDINE HCL 0.1 MG PO TABS
0.1000 mg | ORAL_TABLET | Freq: Four times a day (QID) | ORAL | Status: DC
  Administered 2018-08-20: 0.1 mg via ORAL
  Filled 2018-08-20: qty 1

## 2018-08-20 MED ORDER — DICYCLOMINE HCL 10 MG PO CAPS: 20.0000 mg | ORAL_CAPSULE | Freq: Four times a day (QID) | ORAL | Status: DC | PRN

## 2018-08-20 MED ORDER — CLONIDINE HCL 0.1 MG PO TABS: 0.1000 mg | ORAL_TABLET | ORAL | Status: DC

## 2018-08-20 NOTE — Consult Note (Signed)
Tele Assessment   Michael [name], 37 y.o., male patient presented to [facility] with complaints of suicidal ideation after relapse on crack cocaine and heroin.  .  Patient seen via telepsych by this provider; chart reviewed and consulted with Dr. Lucianne Muss on 08/20/18.  On evaluation Michael [name] reports he came to the hospital because he was having suicidal thoughts "cause there ain't nothing in this life for me."  Patient states he recent finish rehab and for the last 2 weeks he has fallen off the wagon.  Patient states that suicidal with "plenty of plans; jump in front of a car.  Hang myself.  When you hang your self there ain't no pain, just gone; ain't no reason to be scared."  Patient reports he attempted suicide via hanging himself when he was 37 yr old.  Patient denies homicidal ideation, psychosis; but states that he does have some paranoia. "I feel like everybody is out to get me."  Patient states that he does not currently have anywhere to live.  "I was staying with a friend but they put me out."  Patient states that he is using heroin and crack cocaine.  Patient also has complains to of opiate withdrawal.   During evaluation Michael [name] is alert/oriented x 4; calm/cooperative; and mood congruent with affect.  He does not appear to be responding to internal/external stimuli or delusional thoughts.  Patient denied homicidal ideation, psychosis, and paranoia; but continues to endorse suicidal ideation with plan and intent.  Patient unable to contract for safety.  Patient answered question appropriately.  UDS positive for opiates and cocaine  Recommendations:  Inpatient psychiatric treatment LABs stable can start Clonidine detox protocol for opiate withdrawal.  Protocol ordered  Disposition: Recommend psychiatric Inpatient admission when medically cleared.  Spoke with Dr. Ranae Palms; informed of above recommendations and disposition; and Clonidine detox protocol.    Assunta Found, NP

## 2018-08-20 NOTE — ED Notes (Signed)
Patient TTS consult in progress

## 2018-08-20 NOTE — ED Notes (Signed)
Pt given breakfast tray

## 2018-08-20 NOTE — Progress Notes (Signed)
Pt meets inpatient criteria per Assunta Found, NP. Referral information has been sent to the following hospitals for review: [facility]  [facility]  [facility]  [facility]  [facility]  [facility]  [facility]   Disposition will continue to assist with placement needs.   Wells Guiles, LCSW, LCAS Disposition CSW [facility]/TTS [phone]

## 2018-08-21 NOTE — BH Assessment (Signed)
[facility] Assessment Progress Note   Per Lonni Fix at [facility], [phone], patient was being reviewed for admission, but during his screening intake he started requesting a special menu and was inquiring what they used for maintenance.  He was informed that there was no special menu and that maintenance services were not available.  However, he was offered detox, but patient stated that he had not used heroin in three days, therefore patient did not meet admission criteria for detox.  Due to patient's special requests that [facility] could not offer him, they felt like it was best that he seek treatment elsewhere because he would not be happy with their services there.

## 2018-08-21 NOTE — ED Notes (Signed)
Pt ambulatory to restroom

## 2018-08-21 NOTE — Progress Notes (Addendum)
Patient is seen by me via tele-psych today and have consulted with Dr. Lucianne Muss.  Patient does report having some anxiety and some depression due to consistently using drugs and that he had recently got kicked out due to his drug use.  He reported that his girlfriend is tired of his substance abuse.  Patient states that he wants to go back to another rehabilitation center as his substance abuse is his main concern.  He reports that he has been to [facility] in the past and thought that was a great program is wishing to go back.  Patient does not admit to any suicidal or homicidal ideations and does not state of having any hallucinations today.  Patient does not meet inpatient criteria and is psychiatrically cleared.  TTS staff have contacted [facility] and they have a bed available and now the patient must make the phone calls and get to a [facility] for treatment.  I have contacted Dr. Juleen China and notified him of the recommendations.

## 2018-08-21 NOTE — Progress Notes (Addendum)
Disposition CSW called [facility] and spoke to [employer], Intake Coordinator.  [facility] has one male detox bed.  Pt's current ED documentation faxed to [facility] via paper fax to include NP note.  CSW added Provider Follow-up information to pt's d/c summary.  Timmothy Euler. Kaylyn Lim, MSW, LCSWA Disposition Clinical Social Work [phone] (cell) [phone] (office)

## 2018-08-21 NOTE — Discharge Instructions (Signed)
Go to [facility] °

## 2018-09-03 ENCOUNTER — Inpatient Hospital Stay (HOSPITAL_COMMUNITY)
Admission: EM | Admit: 2018-09-03 | Discharge: 2018-09-06 | DRG: 917 | Disposition: A | Discharge status: Other Acute Inpatient Hospital | Payer: Self-pay | Attending: Internal Medicine | Admitting: Internal Medicine

## 2018-09-03 ENCOUNTER — Emergency Department (HOSPITAL_COMMUNITY): Payer: Self-pay

## 2018-09-03 ENCOUNTER — Other Ambulatory Visit: Payer: Self-pay

## 2018-09-03 ENCOUNTER — Encounter (HOSPITAL_COMMUNITY): Payer: Self-pay | Admitting: Emergency Medicine

## 2018-09-03 DIAGNOSIS — F418 Other specified anxiety disorders: Secondary | ICD-10-CM | POA: Diagnosis present

## 2018-09-03 DIAGNOSIS — T50901A Poisoning by unspecified drugs, medicaments and biological substances, accidental (unintentional), initial encounter: Secondary | ICD-10-CM | POA: Diagnosis present

## 2018-09-03 DIAGNOSIS — F1120 Opioid dependence, uncomplicated: Secondary | ICD-10-CM | POA: Diagnosis present

## 2018-09-03 DIAGNOSIS — Z79899 Other long term (current) drug therapy: Secondary | ICD-10-CM

## 2018-09-03 DIAGNOSIS — I10 Essential (primary) hypertension: Secondary | ICD-10-CM | POA: Diagnosis present

## 2018-09-03 DIAGNOSIS — T50904A Poisoning by unspecified drugs, medicaments and biological substances, undetermined, initial encounter: Secondary | ICD-10-CM

## 2018-09-03 DIAGNOSIS — Z781 Physical restraint status: Secondary | ICD-10-CM

## 2018-09-03 DIAGNOSIS — T507X1A Poisoning by analeptics and opioid receptor antagonists, accidental (unintentional), initial encounter: Principal | ICD-10-CM | POA: Diagnosis present

## 2018-09-03 DIAGNOSIS — R4182 Altered mental status, unspecified: Secondary | ICD-10-CM

## 2018-09-03 DIAGNOSIS — K219 Gastro-esophageal reflux disease without esophagitis: Secondary | ICD-10-CM | POA: Diagnosis present

## 2018-09-03 DIAGNOSIS — R45851 Suicidal ideations: Secondary | ICD-10-CM | POA: Diagnosis present

## 2018-09-03 DIAGNOSIS — M79601 Pain in right arm: Secondary | ICD-10-CM | POA: Diagnosis not present

## 2018-09-03 DIAGNOSIS — R410 Disorientation, unspecified: Secondary | ICD-10-CM

## 2018-09-03 DIAGNOSIS — F17210 Nicotine dependence, cigarettes, uncomplicated: Secondary | ICD-10-CM | POA: Diagnosis present

## 2018-09-03 DIAGNOSIS — G92 Toxic encephalopathy: Secondary | ICD-10-CM | POA: Diagnosis present

## 2018-09-03 DIAGNOSIS — Z720 Tobacco use: Secondary | ICD-10-CM

## 2018-09-03 DIAGNOSIS — R000 Tachycardia, unspecified: Secondary | ICD-10-CM | POA: Diagnosis present

## 2018-09-03 DIAGNOSIS — R0689 Other abnormalities of breathing: Secondary | ICD-10-CM | POA: Diagnosis not present

## 2018-09-03 DIAGNOSIS — M25512 Pain in left shoulder: Secondary | ICD-10-CM | POA: Diagnosis not present

## 2018-09-03 HISTORY — DX: Other psychoactive substance abuse, uncomplicated: F19.10

## 2018-09-03 LAB — COMPREHENSIVE METABOLIC PANEL
ALT: 42 U/L (ref 0–44)
AST: 29 U/L (ref 15–41)
Albumin: 3.7 g/dL (ref 3.5–5.0)
Alkaline Phosphatase: 49 U/L (ref 38–126)
Anion gap: 9 (ref 5–15)
BILIRUBIN TOTAL: 0.7 mg/dL (ref 0.3–1.2)
BUN: 10 mg/dL (ref 6–20)
CALCIUM: 9 mg/dL (ref 8.9–10.3)
CO2: 26 mmol/L (ref 22–32)
Chloride: 103 mmol/L (ref 98–111)
Creatinine, Ser: 0.86 mg/dL (ref 0.61–1.24)
GFR calc Af Amer: >60 mL/min (ref >60)
Glucose, Bld: 105 mg/dL — ABNORMAL HIGH (ref 70–99)
POTASSIUM: 3.8 mmol/L (ref 3.5–5.1)
Sodium: 138 mmol/L (ref 135–145)
TOTAL PROTEIN: 7.2 g/dL (ref 6.5–8.1)

## 2018-09-03 LAB — CBC
HEMATOCRIT: 41.5 % (ref 39.0–52.0)
Hemoglobin: 13.7 g/dL (ref 13.0–17.0)
MCH: 30.2 pg (ref 26.0–34.0)
MCHC: 33 g/dL (ref 30.0–36.0)
MCV: 91.4 fL (ref 80.0–100.0)
Platelets: 219 10*3/uL (ref 150–400)
RBC: 4.54 MIL/uL (ref 4.22–5.81)
RDW: 12.5 % (ref 11.5–15.5)
WBC: 9.9 10*3/uL (ref 4.0–10.5)
nRBC: 0 % (ref 0.0–0.2)

## 2018-09-03 LAB — RAPID URINE DRUG SCREEN, HOSP PERFORMED
Amphetamines: NOT DETECTED
BARBITURATES: NOT DETECTED
Benzodiazepines: POSITIVE — AB
Cocaine: NOT DETECTED
Opiates: NOT DETECTED
Tetrahydrocannabinol: NOT DETECTED

## 2018-09-03 LAB — SALICYLATE LEVEL: Salicylate Lvl: <7 mg/dL (ref 2.8–30.0)

## 2018-09-03 LAB — MRSA PCR SCREENING: MRSA BY PCR: NEGATIVE

## 2018-09-03 LAB — ACETAMINOPHEN LEVEL: Acetaminophen (Tylenol), Serum: <10 ug/mL — ABNORMAL LOW (ref 10–30)

## 2018-09-03 LAB — ETHANOL

## 2018-09-03 LAB — CBG MONITORING, ED: GLUCOSE-CAPILLARY: 99 mg/dL (ref 70–99)

## 2018-09-03 MED ORDER — HALOPERIDOL LACTATE 5 MG/ML IJ SOLN
2.0000 mg | Freq: Four times a day (QID) | INTRAMUSCULAR | Status: DC | PRN
  Administered 2018-09-03 (×2): 2 mg via INTRAVENOUS
  Filled 2018-09-03 (×2): qty 1

## 2018-09-03 MED ORDER — SODIUM CHLORIDE 0.9 % IV SOLN
INTRAVENOUS | Status: DC
  Administered 2018-09-03 – 2018-09-06 (×9): via INTRAVENOUS

## 2018-09-03 MED ORDER — ONDANSETRON HCL 4 MG/2ML IJ SOLN: 4.0000 mg | Freq: Four times a day (QID) | INTRAMUSCULAR | Status: DC | PRN

## 2018-09-03 MED ORDER — NALOXONE HCL 0.4 MG/ML IJ SOLN
0.8000 mg | Freq: Once | INTRAMUSCULAR | Status: AC
  Administered 2018-09-03: 0.8 mg via INTRAVENOUS
  Filled 2018-09-03: qty 2

## 2018-09-03 MED ORDER — LORAZEPAM 2 MG/ML IJ SOLN
2.0000 mg | Freq: Once | INTRAMUSCULAR | Status: AC
  Administered 2018-09-03: 2 mg via INTRAVENOUS
  Filled 2018-09-03: qty 1

## 2018-09-03 MED ORDER — HEPARIN SODIUM (PORCINE) 5000 UNIT/ML IJ SOLN
5000.0000 [IU] | Freq: Three times a day (TID) | INTRAMUSCULAR | Status: DC
  Administered 2018-09-04 – 2018-09-06 (×7): 5000 [IU] via SUBCUTANEOUS
  Filled 2018-09-03 (×7): qty 1

## 2018-09-03 MED ORDER — LORAZEPAM 2 MG/ML IJ SOLN
1.0000 mg | Freq: Once | INTRAMUSCULAR | Status: AC
  Administered 2018-09-03: 1 mg via INTRAVENOUS
  Filled 2018-09-03: qty 1

## 2018-09-03 MED ORDER — NALOXONE HCL 0.4 MG/ML IJ SOLN
0.4000 mg | Freq: Once | INTRAMUSCULAR | Status: AC
  Administered 2018-09-03: 0.4 mg via INTRAVENOUS
  Filled 2018-09-03: qty 1

## 2018-09-03 MED ORDER — PANTOPRAZOLE SODIUM 40 MG IV SOLR
40.0000 mg | INTRAVENOUS | Status: DC
  Administered 2018-09-03 – 2018-09-04 (×2): 40 mg via INTRAVENOUS
  Filled 2018-09-03 (×2): qty 40

## 2018-09-03 MED ORDER — HYDRALAZINE HCL 20 MG/ML IJ SOLN: 10.0000 mg | Freq: Three times a day (TID) | INTRAMUSCULAR | Status: DC | PRN

## 2018-09-03 MED ORDER — STERILE WATER FOR INJECTION IJ SOLN
INTRAMUSCULAR | Status: AC
  Filled 2018-09-03: qty 10

## 2018-09-03 MED ORDER — LORAZEPAM 2 MG/ML IJ SOLN
1.0000 mg | INTRAMUSCULAR | Status: DC | PRN
  Administered 2018-09-03: 2 mg via INTRAVENOUS
  Filled 2018-09-03: qty 1

## 2018-09-03 MED ORDER — ZIPRASIDONE MESYLATE 20 MG IM SOLR
20.0000 mg | Freq: Once | INTRAMUSCULAR | Status: AC
  Administered 2018-09-03: 20 mg via INTRAMUSCULAR
  Filled 2018-09-03: qty 20

## 2018-09-03 MED ORDER — SODIUM CHLORIDE 0.9 % IV BOLUS
1000.0000 mL | Freq: Once | INTRAVENOUS | Status: AC
  Administered 2018-09-03: 1000 mL via INTRAVENOUS

## 2018-09-03 NOTE — ED Notes (Signed)
X-ray at bedside

## 2018-09-03 NOTE — ED Notes (Signed)
Pt is still Sales executivefighting nurse and sitter. He is trying to get out of bed. This nurse is trying to reason with him.

## 2018-09-03 NOTE — ED Notes (Signed)
Have gone in to assess patient. Has slight redness to wrists from pulling at restraints. Good circulation in all extremities.

## 2018-09-03 NOTE — ED Notes (Signed)
Have given pt Narcan. It did not have any significant effect. Pt still slightly combative, but is starting to relax slowly

## 2018-09-03 NOTE — ED Notes (Signed)
Pt attempted to jump out of bed. Pt unsteady with AMS. Pt pulling at IV tubing and chest leads.

## 2018-09-03 NOTE — Plan of Care (Signed)

## 2018-09-03 NOTE — ED Triage Notes (Addendum)
Pt sent by family for possible OD and AMS. States [facility]. Family reports that when he got back, his mental function began to decline. Pt began having slurred speech, pt would fall asleep while people were talking, pinpoint pupils. Pt unable to answer questions about what he took. [facility]. Pt is on suboxone per family. Hx of opioid abuse. Pt denies intentional OD, but made multiple comments to EMS that he couldn't die, but at times wanted to. Pt noted to have dried vomit around mouth.

## 2018-09-03 NOTE — ED Provider Notes (Signed)
[facility] EMERGENCY DEPARTMENT Provider Note   CSN: [id number] Arrival date & time: 09/03/18  [id number]     History   Chief Complaint Chief Complaint  Patient presents with  . Altered Mental Status    HPI Michael [name] is a 37 y.o. male.  The history is provided by the patient, the EMS personnel and a relative. The history is limited by the condition of the patient (AMS).  Altered Mental Status    Pt was seen at 0725. Per EMS and family report: Family called EMS for AMS, possible OD. Family states he went [facility] a "[facility] last evening and when he came home, his mental status "started to gradually decline." Pt was having slurred speech, fall asleep while people were talking, pupils pinpoint. Pt will not answer questions regarding what he took or why. Pt filled Suboxone rx 2 days ago, family believes he may have taken this. EMS states pt told them he "couldn't die but at times wanted to." EMS noted dried emesis on pt's mouth. Pt himself will not answer questions, is mumbling and intermittently flailing about on stretcher trying to get out of bed.      Past Medical History:  Diagnosis Date  . Polysubstance abuse (HCC)   . Sciatica   . Shoulder dislocation     Patient Active Problem List   Diagnosis Date Noted  . OCD (obsessive compulsive disorder) 04/13/2018  . Major depressive disorder, recurrent severe without psychotic features (HCC) 04/06/2018  . Cannabis use disorder, moderate, dependence (HCC) 04/06/2018  . Cocaine use disorder, moderate, dependence (HCC) 04/06/2018  . Sedative, hypnotic or anxiolytic use disorder, severe, dependence (HCC) 04/06/2018  . Tobacco use disorder 04/06/2018    Past Surgical History:  Procedure Laterality Date  . ELBOW FRACTURE SURGERY          Home Medications    Prior to Admission medications   Medication Sig Start Date End Date Taking? Authorizing Provider  Buprenorphine HCl-Naloxone HCl 8-2 MG FILM Place 1 Film  under the tongue 2 (two) times daily. 08/09/18   [provider]  cloNIDine (CATAPRES) 0.1 MG tablet Take 0.1 mg by mouth at bedtime.  06/30/18   [provider]    Family History No family history on file.  Social History Social History   Tobacco Use  . Smoking status: Current Every Day Smoker    Packs/day: 1.00    Types: Cigarettes  . Smokeless tobacco: Never Used  Substance Use Topics  . Alcohol use: Yes    Comment: "rarely"  . Drug use: Yes    Types: IV    Comment: Heroin, Crack yesterday     Allergies   Patient has no known allergies.   Review of Systems Review of Systems  Unable to perform ROS: Mental status change     Physical Exam Updated Vital Signs BP (!) 109/93   Pulse (!) 114   Temp 98.5 F (36.9 C) (Oral)   Resp 19   Ht 5\' 6"  (1.676 m)   Wt 79 kg   SpO2 100%   BMI 28.11 kg/m    Patient Vitals for the past 24 hrs:  BP Temp Temp src Pulse Resp SpO2 Height Weight  09/03/18 0736 (!) 109/93 - - (!) 114 19 100 % - -  09/03/18 0704 - - - - - 100 % - -  09/03/18 0704 136/87 98.5 F (36.9 C) Oral (!) 108 (!) 22 94 % - -  09/03/18 0700 Marland Kitchen(!)  102/58 - - - (!) 36 - - -  09/03/18 1610 - - - - - - 5\' 6"  (1.676 m) 79 kg     Physical Exam 0730: Physical examination:  Nursing notes reviewed; Vital signs and O2 SAT reviewed;  Constitutional: Well developed, Well nourished, In no acute distress; Head:  Normocephalic, atraumatic; Eyes: EOMI, PERRL/small. No scleral icterus; ENMT: Mouth and pharynx normal, Mucous membranes dry. Dried emesis in corners of mouth.; Neck: Supple, Full range of motion, No lymphadenopathy; Cardiovascular: Tachycardic rate and rhythm, No gallop; Respiratory: Breath sounds clear & equal bilaterally, No wheezes. Normal respiratory effort/excursion; Chest: Nontender, Movement normal; Abdomen: Soft, Nontender, Nondistended, Normal bowel sounds; Genitourinary: No CVA tenderness; Extremities: Peripheral pulses normal, No  tenderness, No edema, No calf edema or asymmetry.; Neuro: Somnolent, then opens eyes and mumbles, tries to get out of bed. No facial droop. Moves all extremities spontaneously without apparent gross focal motor deficits.; Skin: Color normal, Warm, Dry.   ED Treatments / Results  Labs (all labs ordered are listed, but only abnormal results are displayed)   EKG EKG Interpretation  Date/Time:  Thursday September 03 2018 06:59:06 EST Ventricular Rate:  106 PR Interval:    QRS Duration: 97 QT Interval:  327 QTC Calculation: 435 R Axis:   88 Text Interpretation:  Sinus tachycardia Baseline wander When compared with ECG of 04/06/2018 Rate faster Confirmed by Samuel Jester [phone]) on 09/03/2018 8:03:14 AM   Radiology   Procedures Procedures (including critical care time)  Medications Ordered in ED Medications  0.9 %  sodium chloride infusion ( Intravenous Rate/Dose Verify 09/03/18 0747)  sodium chloride 0.9 % bolus 1,000 mL ( Intravenous Rate/Dose Verify 09/03/18 0747)  naloxone [facility]) injection 0.4 mg (0.4 mg Intravenous Given 09/03/18 0734)     Initial Impression / Assessment and Plan / ED Course  I have reviewed the triage vital signs and the nursing notes.  Pertinent labs & imaging results that were available during my care of the patient were reviewed by me and considered in my medical decision making (see chart for details).  MDM Reviewed: previous chart, nursing note and vitals Reviewed previous: labs and ECG Interpretation: labs, ECG, x-ray and CT scan Total time providing critical care: 30-74 minutes. This excludes time spent performing separately reportable procedures and services. Consults: admitting MD   CRITICAL CARE Performed by: Samuel Jester Total critical care time: 35 minutes Critical care time was exclusive of separately billable procedures and treating other patients. Critical care was necessary to treat or prevent imminent or life-threatening  deterioration. Critical care was time spent personally by me on the following activities: development of treatment plan with patient and/or surrogate as well as nursing, discussions with consultants, evaluation of patient's response to treatment, examination of patient, obtaining history from patient or surrogate, ordering and performing treatments and interventions, ordering and review of laboratory studies, ordering and review of radiographic studies, pulse oximetry and re-evaluation of patient's condition.    Results for orders placed or performed during the hospital encounter of 09/03/18  Comprehensive metabolic panel  Result Value Ref Range   Sodium 138 135 - 145 mmol/L   Potassium 3.8 3.5 - 5.1 mmol/L   Chloride 103 98 - 111 mmol/L   CO2 26 22 - 32 mmol/L   Glucose, Bld 105 (H) 70 - 99 mg/dL   BUN 10 6 - 20 mg/dL   Creatinine, Ser 4.09 0.61 - 1.24 mg/dL   Calcium 9.0 8.9 - 81.1 mg/dL   Total Protein 7.2  6.5 - 8.1 g/dL   Albumin 3.7 3.5 - 5.0 g/dL   AST 29 15 - 41 U/L   ALT 42 0 - 44 U/L   Alkaline Phosphatase 49 38 - 126 U/L   Total Bilirubin 0.7 0.3 - 1.2 mg/dL   GFR calc non Af Amer >60 >60 mL/min   GFR calc Af Amer >60 >60 mL/min   Anion gap 9 5 - 15  Ethanol  Result Value Ref Range   Alcohol, Ethyl (B) <10 <10 mg/dL  Salicylate level  Result Value Ref Range   Salicylate Lvl <7.0 2.8 - 30.0 mg/dL  Acetaminophen level  Result Value Ref Range   Acetaminophen (Tylenol), Serum <10 (L) 10 - 30 ug/mL  cbc  Result Value Ref Range   WBC 9.9 4.0 - 10.5 K/uL   RBC 4.54 4.22 - 5.81 MIL/uL   Hemoglobin 13.7 13.0 - 17.0 g/dL   HCT 16.1 09.6 - 04.5 %   MCV 91.4 80.0 - 100.0 fL   MCH 30.2 26.0 - 34.0 pg   MCHC 33.0 30.0 - 36.0 g/dL   RDW 40.9 81.1 - 91.4 %   Platelets 219 150 - 400 K/uL   nRBC 0.0 0.0 - 0.2 %  Rapid urine drug screen (hospital performed)  Result Value Ref Range   Opiates NONE DETECTED NONE DETECTED   Cocaine NONE DETECTED NONE DETECTED   Benzodiazepines  POSITIVE (A) NONE DETECTED   Amphetamines NONE DETECTED NONE DETECTED   Tetrahydrocannabinol NONE DETECTED NONE DETECTED   Barbiturates NONE DETECTED NONE DETECTED  CBG monitoring, ED  Result Value Ref Range   Glucose-Capillary 99 70 - 99 mg/dL    Ct Head Wo Contrast Result Date: 09/03/2018 CLINICAL DATA:  Altered mental status and slurred speech. Concern for drug overdose EXAM: CT HEAD WITHOUT CONTRAST TECHNIQUE: Contiguous axial images were obtained from the base of the skull through the vertex without intravenous contrast. COMPARISON:  None. FINDINGS: Brain: The ventricles are normal in size and configuration. There is no intracranial mass, hemorrhage, extra-axial fluid collection, or midline shift. Brain parenchyma appears unremarkable. No acute infarct evident. Vascular: There is no hyperdense vessel. There is no appreciable vascular calcification. Skull: No fracture evident. There is a mixed attenuation lesion in the left frontal bone which is mildly expansile measuring 1.7 x 1.1 cm. No other bone lesions evident. Sinuses/Orbits: There is mucosal thickening in each maxillary antrum. There is also mucosal thickening in several ethmoid air cells. Orbits appear symmetric bilaterally. Other: Mastoid air cells are clear. IMPRESSION: 1. Normal brain parenchyma. No acute infarct. No intracranial mass or hemorrhage. 2. Mixed attenuation bone lesion in the left frontal bone with mild expansion superficially. 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 No other bone lesions evident. 3.  Mild paranasal sinus disease at several sites. Electronically Signed   By: Bretta Bang [name] M.D.   On: 09/03/2018 09:24   Dg Chest Port 1 View Result Date: 09/03/2018 CLINICAL DATA:  Altered mental status. EXAM: PORTABLE CHEST 1 VIEW COMPARISON:  06/09/2008 FINDINGS: The cardiac silhouette appears mildly enlarged, accentuated by portable AP technique and low lung volumes. Bronchovascular crowding is noted in the lung bases. No  airspace consolidation, overt pulmonary edema, sizable pleural effusion, or pneumothorax is identified. No acute osseous abnormality is seen. IMPRESSION: No active disease. Electronically Signed   By: Sebastian Ache M.D.   On: 09/03/2018 08:16     0945:  Pt somnolent, then agitated, flailing around on stretcher and trying to get out of bed. Will talk  somewhat, then lay down again. Not forthcoming regarding ingestion today (what he took or intent). Family concerned regarding suboxone OD. This certainly may be possible, given pt's AMS (waxing and waning LOC) since last evening and suboxone's long 1/2 life. [place] PMP Database accessed: pt filled buprenorph-nalox 8-2mg  Sl Film, #7, on 09/01/2018, rx by Uvaldo Rising. Dareen Piano, NP.  Pt given IV ativan so as not to harm himself or ED staff.  T/C returned from [facility]. Gwenlyn Perking, case discussed, including:  HPI, pertinent PM/SHx, VS/PE, dx testing, ED course and treatment:  Agreeable to admit.     Final Clinical Impressions(s) / ED Diagnoses   Final diagnoses:  AMS (altered mental status)    ED Discharge Orders    None       Samuel Jester, DO 09/07/18 2146

## 2018-09-03 NOTE — H&P (Addendum)
History and Physical    Karron Alvizo III YQM:[id number] DOB: 01-Oct-1981 DOA: 09/03/2018  Referring MD/NP/PA: Dr. Clarene Duke PCP: Patient, No Pcp Per  Patient coming from: home  Chief Complaint: AMS  HPI: Kyal Arts III is a 37 y.o. male with PMH significant for bipolar disorder, polysubstance abuse (heroin, cocaine, tobacco) and chronically on Suboxone; who was bought to ED by his family members due to increase somnolence and AMS. Patient unable to contribute with history due to altered mental status. Family reported that he went to a meeting at [facility] the night prior to admission and when he returned home was somnolent; he went to bed and on the morning of admission they noticed that he was confused and unable to follow commands or answer questions. Family also expressed slurred speech. There was not rports for fever, CP, nausea, abd pain, hematuria, urine incontinence, melena, hematochezia or any other complaints. Per EMS patient had signs of dry vomits on his clothes.   In ED, CT scan head neg for acute intracranial abnormalities, CXR without infiltrates, minimal response to narcan and then subsequent severe uncontrolled agitation. UDS was positive for benzodiazepine only; per data base records he received refill for Suboxone on 09/01/18. Ativan given to assist with agitation without any success. Patient started on restrain for safety reason and given haldol X 1 and one dose of Geodon. Finally resting and protecting airways. TRH contacted to place in observation for further evaluation and management.   Past Medical/Surgical History: Past Medical History:  Diagnosis Date  . Polysubstance abuse (HCC)   . Sciatica   . Shoulder dislocation     Past Surgical History:  Procedure Laterality Date  . ELBOW FRACTURE SURGERY      Social History:  reports that he has been smoking cigarettes. He has been smoking about 1.00 pack per day. He has never used smokeless tobacco. He  reports that he drinks alcohol. He reports that he has current or past drug history. Drug: IV.  Allergies: No Known Allergies  Family History:  Unable to review given patient acute encephalopathy. Family member at bedside expressed not acute medical problems in his immediate family.  Prior to Admission medications   Medication Sig Start Date End Date Taking? Authorizing Provider  Buprenorphine HCl-Naloxone HCl 8-2 MG FILM Place 1 Film under the tongue 2 (two) times daily. 08/09/18   [provider]  cloNIDine (CATAPRES) 0.1 MG tablet Take 0.1 mg by mouth at bedtime.  06/30/18   [provider]    Review of Systems:  Patient with unstable balance, agitation, AMS and global disorientation. Unable to cooperate with ROS. See above on HPI, for reports provided by family member.   Physical Exam: Vitals:   09/03/18 1552 09/03/18 1700 09/03/18 1752 09/03/18 1800  BP: (!) 132/108 139/82  128/65  Pulse: 98 (!) 101  (!) 108  Resp: (!) 22 (!) 21  16  Temp:   97.7 F (36.5 C)   TempSrc:   Axillary   SpO2: 95% 96%  95%  Weight:   86.1 kg   Height:        Constitutional: disoriented, with altered mentation and unable to follow commands. No fever. Eyes: PERRL, lids and conjunctivae normal, no icterus. ENMT: Mucous membranes are moist. Posterior pharynx clear of any exudate or lesions. fair dentition.  Neck: normal, supple, no masses, no thyromegaly Respiratory: clear to auscultation bilaterally, no wheezing, no crackles. Normal respiratory effort. No accessory muscle use. Protecting airways.  Cardiovascular: Regular rate  and rhythm, no murmurs / rubs / gallops. No extremity edema. 2+ pedal pulses. No carotid bruits.  Abdomen: no tenderness, no masses palpated. No hepatosplenomegaly. Bowel sounds positive.  Musculoskeletal: no clubbing / cyanosis. No joint deformity upper and lower extremities. Good ROM, no contractures. Normal muscle tone.  Skin: no rashes, lesions, ulcers.  No induration Neurologic: disoriented, unable to follow any commands. And with Unintelligent speech currently; patient moving four limbs spontaneously.   Psychiatric: unable to assess due to AMS.   Labs on Admission: I have personally reviewed the following labs and imaging studies  CBC: Recent Labs  Lab 09/03/18 0711  WBC 9.9  HGB 13.7  HCT 41.5  MCV 91.4  PLT 219   Basic Metabolic Panel: Recent Labs  Lab 09/03/18 0711  NA 138  K 3.8  CL 103  CO2 26  GLUCOSE 105*  BUN 10  CREATININE 0.86  CALCIUM 9.0   GFR: Estimated Creatinine Clearance: 123.3 mL/min (by C-G formula based on SCr of 0.86 mg/dL).   Liver Function Tests: Recent Labs  Lab 09/03/18 0711  AST 29  ALT 42  ALKPHOS 49  BILITOT 0.7  PROT 7.2  ALBUMIN 3.7  CBG: Recent Labs  Lab 09/03/18 0707  GLUCAP 99   Urine analysis:    Component Value Date/Time   COLORURINE YELLOW 01/21/2016 1143   APPEARANCEUR CLEAR 01/21/2016 1143   LABSPEC 1.015 01/21/2016 1143   PHURINE 6.0 01/21/2016 1143   GLUCOSEU NEGATIVE 01/21/2016 1143   HGBUR NEGATIVE 01/21/2016 1143   BILIRUBINUR NEGATIVE 01/21/2016 1143   KETONESUR NEGATIVE 01/21/2016 1143   PROTEINUR NEGATIVE 01/21/2016 1143   NITRITE NEGATIVE 01/21/2016 1143   LEUKOCYTESUR NEGATIVE 01/21/2016 1143   Radiological Exams on Admission: Ct Head Wo Contrast  Result Date: 09/03/2018 CLINICAL DATA:  Altered mental status and slurred speech. Concern for drug overdose EXAM: CT HEAD WITHOUT CONTRAST TECHNIQUE: Contiguous axial images were obtained from the base of the skull through the vertex without intravenous contrast. COMPARISON:  None. FINDINGS: Brain: The ventricles are normal in size and configuration. There is no intracranial mass, hemorrhage, extra-axial fluid collection, or midline shift. Brain parenchyma appears unremarkable. No acute infarct evident. Vascular: There is no hyperdense vessel. There is no appreciable vascular calcification. Skull: No  fracture evident. There is a mixed attenuation lesion in the left frontal bone which is mildly expansile measuring 1.7 x 1.1 cm. No other bone lesions evident. Sinuses/Orbits: There is mucosal thickening in each maxillary antrum. There is also mucosal thickening in several ethmoid air cells. Orbits appear symmetric bilaterally. Other: Mastoid air cells are clear. IMPRESSION: 1. Normal brain parenchyma. No acute infarct. No intracranial mass or hemorrhage. 2. Mixed attenuation bone lesion in the left frontal bone with mild expansion superficially. 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 No other bone lesions evident. 3.  Mild paranasal sinus disease at several sites. Electronically Signed   By: Bretta Bang III M.D.   On: 09/03/2018 09:24   Dg Chest Port 1 View  Result Date: 09/03/2018 CLINICAL DATA:  Altered mental status. EXAM: PORTABLE CHEST 1 VIEW COMPARISON:  06/09/2008 FINDINGS: The cardiac silhouette appears mildly enlarged, accentuated by portable AP technique and low lung volumes. Bronchovascular crowding is noted in the lung bases. No airspace consolidation, overt pulmonary edema, sizable pleural effusion, or pneumothorax is identified. No acute osseous abnormality is seen. IMPRESSION: No active disease. Electronically Signed   By: Sebastian Ache M.D.   On: 09/03/2018 08:16    EKG: Independently reviewed. Normal axis, normal QT, no  acute ischemic changes. Sinus tachycardia.  Assessment/Plan 1-acute encephalopathy due to suspected Drug overdose -UDS was positive only for benzodiazepine  -neg alcohol level, salicylates and acetaminophen -family expressed concerns for Suboxone OD -rapid UDS will not pick up Suboxone -will admit to stepdown and follow clinical response -minimal response to narcan -now mainly agitated and euforic -continue PRN haldol -patient received X 1 dose of Geodon -protecting airways  -fairly recent TSH and B12 level were unremarkable. -Sitter at bedside ordered.  2-GI  prophylaxis -will use IV protonix  3-hx of HTN -will use PRN iv hydralazine -BP stable currently   DVT prophylaxis: heparin Code Status: Full Family Communication: mother at bedside  Disposition Plan: to be determine; might need inpatient psychiatry treatment. Consults called: none   Admission status: observation, LOS < 2 midnights, stepdown.    Time Spent: 65 minutes.  Vassie Lollarlos Ira Busbin MD [facility] Pager [phone]  If 7PM-7AM, please contact night-coverage [email or web address] Password [email or web address]  09/03/2018, 6:24 PM

## 2018-09-03 NOTE — ED Notes (Signed)
EDP at bedside  

## 2018-09-04 LAB — BLOOD GAS, ARTERIAL
ACID-BASE DEFICIT: 1.4 mmol/L (ref 0.0–2.0)
Bicarbonate: 21.8 mmol/L (ref 20.0–28.0)
Drawn by: 419771
FIO2: 100
O2 SAT: 94.4 %
PATIENT TEMPERATURE: 37
PO2 ART: 86 mmHg (ref 83.0–108.0)
pCO2 arterial: 61.5 mmHg — ABNORMAL HIGH (ref 32.0–48.0)
pH, Arterial: 7.235 — ABNORMAL LOW (ref 7.350–7.450)

## 2018-09-04 MED ORDER — DEXMEDETOMIDINE HCL IN NACL 200 MCG/50ML IV SOLN
0.4000 ug/kg/h | INTRAVENOUS | Status: DC
  Administered 2018-09-04 (×2): 0.4 ug/kg/h via INTRAVENOUS
  Administered 2018-09-04: 1.2 ug/kg/h via INTRAVENOUS
  Filled 2018-09-04 (×3): qty 50

## 2018-09-04 MED ORDER — NICOTINE 21 MG/24HR TD PT24
21.0000 mg | MEDICATED_PATCH | Freq: Every day | TRANSDERMAL | Status: DC
  Administered 2018-09-04 – 2018-09-06 (×3): 21 mg via TRANSDERMAL
  Filled 2018-09-04 (×3): qty 1

## 2018-09-04 MED ORDER — ORAL CARE MOUTH RINSE
15.0000 mL | Freq: Two times a day (BID) | OROMUCOSAL | Status: DC
  Administered 2018-09-04: 15 mL via OROMUCOSAL

## 2018-09-04 MED ORDER — ZIPRASIDONE MESYLATE 20 MG IM SOLR
20.0000 mg | Freq: Once | INTRAMUSCULAR | Status: AC
  Administered 2018-09-04: 20 mg via INTRAMUSCULAR
  Filled 2018-09-04 (×2): qty 20

## 2018-09-04 MED ORDER — LORAZEPAM 2 MG/ML IJ SOLN: 1.0000 mg | INTRAMUSCULAR | Status: DC | PRN

## 2018-09-04 MED ORDER — CHLORHEXIDINE GLUCONATE 0.12 % MT SOLN
15.0000 mL | Freq: Two times a day (BID) | OROMUCOSAL | Status: DC
  Administered 2018-09-04 – 2018-09-05 (×3): 15 mL via OROMUCOSAL
  Filled 2018-09-04 (×2): qty 15

## 2018-09-04 NOTE — Progress Notes (Signed)
[facility] contacted due to pt continued agitation and restless after haldol. New order for ativan 1 mg.

## 2018-09-04 NOTE — Progress Notes (Signed)
[facility] contacted due to patients continued agitation, restlessness and repeatedly asking for BR. New order for foley obtained.  97F foley inserted without difficulty and immediated return of 1600 ml  yellow urine

## 2018-09-04 NOTE — Progress Notes (Signed)
Call to elink to camera in on pt. precedex drip at max.

## 2018-09-04 NOTE — BH Assessment (Addendum)
Tele Assessment Note   Patient Name: Michael [name] MRN: [id number] Referring Physician: Vassie Lollarlos Madera, MD [facility] [phone] Location of Provider: Behavioral Health TTS Department  Michael [name] is an 37 y.o. single male who presents unaccompanied to [facility] and is currently in ICU. Pt reports he is here "because my parents said I was high out of my mind but I wasn't."  Pt sent by family for possible OD and AMS. States he went to a "[facility]" [facility]. Family reports that when he got back, his mental function began to decline. Pt began having slurred speech, pt would fall asleep while people were talking, pinpoint pupils. Pt was unable to answer questions about what he took. Family thinks he may have [facility]. Pt made comments to EMS that he couldn't die but wanted to.  Pt has a history of major depressive disorder and substance abuse. Pt acknowledges that he is depressed and that he took an unknown quantity of benzodiazepines. He denies current suicidal ideation but acknowledges he has experienced suicidal ideation recently with plan to overdose on heroin. Pt reports a history of attempting to hang himself at age 37 and cutting his wrist in a suicide attempt at his mid-twenties. Pt states, "I feel like I have been tortured all my life." Pt reports his mood has been depressed and he acknowledges symptoms including crying spells, social withdrawal, loss of interest in usual pleasures, fatigue, irritability, decreased concentration, decreased sleep, decreased appetite and feelings of guilt, worthlessness and hopelessness. Pt denies any history of intentional self-injurious behaviors. Pt denies current homicidal ideation or history of violence. He required physical restraint earlier because of AMS and insisting on getting out of bed. Pt reports he has experienced visual hallucinations in the past but denies current  auditory or visual hallucinations.  Pt reports he ingested an unknown quantity of benzodiazepines but says "it wasn't that much." Pt would not give specifics of use. Pt acknowledges a history of using pain medications, heroin, cannabis and ecstasy. Pt denies alcohol use. Pt reports his longest period of sobriety was one year. Pt's urine drug screen is positive for benzodiazepines.  Pt identifies several stressors. He says he doesn't have a place to live because he doesn't think his parents will let him return to their residence. He states he is unemployed and has no Architectfinancial resources. He says his girlfriend recently broke up with him. He cannot identify any social supports. Pt denies history of abuse or trauma. He denies current legal problems.  Pt denies any current outpatient mental health providers and says he is not taking any psychiatric medications. Pt has [facility] in [place] in the past. He reports he was psychiatrically hospitalized at [facility] in June 2019 and then went to "[facility]" for substance abuse treatment.  Pt is dressed in hospital gown, alert and oriented x4. Pt speaks in a clear tone, at moderate volume and normal pace. Motor behavior appears normal. Eye contact is good. Pt's mood is depressed and affect is congruent with mood. Thought process is coherent and relevant. There is no indication Pt is currently responding to internal stimuli or experiencing delusional thought content. Pt was cooperative throughout assessment. He says he is willing to sign voluntarily into a psychiatric facility.   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 Polysubstance abuse (HCC)   . Sciatica   . Shoulder dislocation     Past Surgical History:  Procedure Laterality Date  .  ELBOW FRACTURE SURGERY      Family History: No family history on file.  Social History:  reports that he has been smoking cigarettes. He has been smoking about 1.00 pack per day. He has never used smokeless tobacco. He reports that he drinks alcohol. He reports that he has current or past drug history. Drug: IV.  Additional Social History:  Alcohol / Drug Use Pain Medications: Pt has a history of abusing opiate pain medications Prescriptions: Pt denies any current prescriptions Over the Counter: Pt denies abuse History of alcohol / drug use?: Yes Longest period of sobriety (when/how long): One year Negative Consequences of Use: Financial, Legal, Personal relationships, Work / School Substance #1 Name of Substance 1: Crack-Cocaine  1 - Age of First Use: 21 1 - Amount (size/oz): Up to $1000 Worth  1 - Frequency: Daily  1 - Duration: 16 Years 1 - Last Use / Amount: 08/17/2018 Substance #2 Name of Substance 2: Heroin  2 - Age of First Use: 35 2 - Amount (size/oz): $40 Worth  2 - Frequency: "I've only used a couple times."  2 - Duration: Pt reports very recent use.  2 - Last Use / Amount: 08/17/2018 Substance #3 Name of Substance 3: Benzodiazepines 3 - Age of First Use: 35 3 - Amount (size/oz): Pt would not specify 3 - Frequency: Unknown 3 - Duration: unknown 3 - Last Use / Amount: 09/03/18, unknown  CIWA: CIWA-Ar BP: (!) 94/52 Pulse Rate: 89 COWS:    Allergies: No Known Allergies  Home Medications:  Medications Prior to Admission  Medication Sig Dispense Refill  . Buprenorphine HCl-Naloxone HCl 8-2 MG FILM Place 1 Film under the tongue 2 (two) times daily.  0  . cloNIDine (CATAPRES) 0.1 MG tablet Take 0.1 mg by mouth at bedtime.   0    OB/GYN Status:  No LMP for male patient.  General Assessment Data Assessment unable to be completed: Yes Reason for not completing assessment: Tele-cart will not connect [facility] of [facility]: [facility]  Assessment: In system Is this a Tele or Face-to-Face Assessment?: Tele Assessment Is this an Initial Assessment or a Re-assessment for this encounter?: Initial Assessment Patient Accompanied by:: Other(Alone) Language Other than English: No Living Arrangements: Homeless/Shelter What gender do you identify as?: Male Marital status: Single Maiden name: NA Pregnancy Status: No Living Arrangements: Other (Comment)(Currently staying with parents) Can pt return to current living arrangement?: Yes(Pt unsure if he can return to parent's residence) Admission Status: Voluntary Is patient capable of signing voluntary admission?: Yes Referral Source: Self/Family/Friend Insurance type: Self-pay     Crisis Care Plan Living Arrangements: Other (Comment)(Currently staying with parents) Legal Guardian: Other:(Self) Name of Psychiatrist: None Name of Therapist: None  Education Status Is patient currently in school?: No Is the patient employed, unemployed or receiving disability?: Unemployed  Risk to self with the past 6 months Suicidal Ideation: Yes-Currently Present Has patient been a risk to self within the past 6 months prior to admission? : Yes Suicidal Intent: No Has patient had any suicidal intent within the past 6 months prior to admission? : Yes Is patient at risk for suicide?: Yes Suicidal Plan?: No Has patient had any suicidal plan within the past 6 months prior to admission? : Yes Specify Current Suicidal Plan: Overdose  on heroin Access to Means: Yes Specify Access to Suicidal Means: Access to various substances What has been your use of drugs/alcohol within the last 12 months?: Pt abusing benzodiazepines. History of abusing cocaine, opiates, cannabis and other drugs Previous Attempts/Gestures: Yes How many times?: 2(History of hanging himself and cutting his wrist) Other Self Harm Risks: History of accidental drug overdose Triggers for Past Attempts: Other personal  contacts Intentional Self Injurious Behavior: None Comment - Self Injurious Behavior: Pt has cut wrist in the past in suicide attempt Family Suicide History: Yes(Uncle died by shooting himself in the head) Recent stressful life event(s): Conflict (Comment), Job Loss, Loss (Comment), Financial Problems(Homeless, unemployed, girlfriend left) Persecutory voices/beliefs?: No Depression: Yes Depression Symptoms: Despondent, Tearfulness, Isolating, Fatigue, Guilt, Loss of interest in usual pleasures, Feeling angry/irritable, Feeling worthless/self pity Substance abuse history and/or treatment for substance abuse?: Yes Suicide prevention information given to non-admitted patients: Not applicable  Risk to Others within the past 6 months Homicidal Ideation: No Does patient have any lifetime risk of violence toward others beyond the six months prior to admission? : Yes (comment) Thoughts of Harm to Others: No Current Homicidal Intent: No Current Homicidal Plan: No Access to Homicidal Means: No Identified Victim: None History of harm to others?: No Assessment of Violence: On admission Violent Behavior Description: Pt physically aggressive with staff Does patient have access to weapons?: No Criminal Charges Pending?: No Does patient have a court date: No Is patient on probation?: No  Psychosis Hallucinations: None noted Delusions: None noted  Mental Status Report Appearance/Hygiene: In hospital gown Eye Contact: Good Motor Activity: Unremarkable Speech: Logical/coherent Level of Consciousness: Alert Mood: Depressed Affect: Depressed Anxiety Level: Minimal Thought Processes: Coherent, Relevant Judgement: Partial Orientation: Person, Place, Time, Situation Obsessive Compulsive Thoughts/Behaviors: None  Cognitive Functioning Concentration: Normal Memory: Recent Intact, Remote Intact Is patient IDD: No Insight: Poor Impulse Control: Poor Appetite: Fair Have you had any weight  changes? : Loss Amount of the weight change? (lbs): 10 lbs Sleep: Decreased Total Hours of Sleep: 6 Vegetative Symptoms: None  ADLScreening [facility] Assessment Services) Patient's cognitive ability adequate to safely complete daily activities?: Yes Patient able to express need for assistance with ADLs?: Yes Independently performs ADLs?: Yes (appropriate for developmental age)  Prior Inpatient Therapy Prior Inpatient Therapy: Yes Prior Therapy Dates: 04/2018, 03/2018, 2001 Prior Therapy Facilty/Provider(s): [facility], [place], [facility] [address] Reason for Treatment: MDD suicidal, SA  Prior Outpatient Therapy Prior Outpatient Therapy: Yes Prior Therapy Dates: 2019 [facility]  Reason for Treatment: Cocaine and Heroin  Does patient have an ACCT team?: No Does patient have Intensive In-House Services?  : No Does patient have [facility] services? : No Does patient have P4CC services?: No  ADL Screening (condition at time of admission) Patient's cognitive ability adequate to safely complete daily activities?: Yes Is the patient deaf or have difficulty hearing?: No Does the patient have difficulty seeing, even when wearing glasses/contacts?: No Does the patient have difficulty concentrating, remembering, or making decisions?: No Patient able to express need for assistance with ADLs?: Yes Does the patient have difficulty dressing or bathing?: No Independently performs ADLs?: Yes (appropriate for developmental age) Does the patient have difficulty walking or climbing stairs?: No Weakness of Legs: None Weakness of Arms/Hands: None  Home Assistive Devices/Equipment Home Assistive Devices/Equipment: None  Therapy Consults (therapy consults require a physician order) PT Evaluation Needed: No OT Evalulation Needed: No SLP Evaluation Needed: No Abuse/Neglect Assessment (Assessment to be complete while patient is alone) Abuse/Neglect  Assessment Can Be  Completed: Yes Physical Abuse: Denies Verbal Abuse: Denies Sexual Abuse: Denies Exploitation of patient/patient's resources: Denies Self-Neglect: Denies Values / Beliefs Cultural Requests During Hospitalization: None Spiritual Requests During Hospitalization: None Consults Spiritual Care Consult Needed: No Social Work Consult Needed: No Merchant navy officer (For Healthcare) Does Patient Have a Medical Advance Directive?: No Would patient like information on creating a medical advance directive?: No - Patient declined Nutrition Screen- [facility] Adult/WL/AP Patient's home diet: Regular Has the patient recently lost weight without trying?: No Has the patient been eating poorly because of a decreased appetite?: No Malnutrition Screening Tool Score: 0        Disposition: Gave clinical report to Donell Sievert, PA who said Pt meets criteria for inpatient psychiatric treatment once medically cleared. Pt will be reviewed for admission to [facility] when medically cleared. Pt currently agrees to sign voluntarily into a psychiatric facility but should be petitioned for involuntary commitment if he attempts to leave AMA. Notified Maureen Ralphs, RN at [facility] of recommendation and she said she would contact physician extender.  Disposition Initial Assessment Completed for this Encounter: Yes Patient referred to: Other (Comment)  This service was provided via telemedicine using a 2-way, interactive audio and video technology.  Names of all persons participating in this telemedicine service and their role in this encounter. Name: Michael Hew Role: Patient  Name: Shela Commons, [place] Role: TTS counselor         Harlin Rain Patsy [place], [facility] Commons, [facility] Triage Specialist [phone]  Pamalee Leyden 09/04/2018 8:04 PM

## 2018-09-04 NOTE — Progress Notes (Signed)
NTS patient removing moderate amount of tan/bloody thick secretions. Patient tolerated well. Sats 98% at this time.

## 2018-09-04 NOTE — Progress Notes (Signed)
PROGRESS NOTE    Michael HewRobert Jackson Tailor III  YQM:[id number] DOB: 05/14/1981 DOA: 09/03/2018 PCP: Patient, No Pcp Per     Brief Narrative:  37 y.o. male with PMH significant for bipolar disorder, polysubstance abuse (heroin, cocaine, tobacco) and chronically on Suboxone; who was bought to ED by his family members due to increase somnolence and AMS. Patient unable to contribute with history due to altered mental status. Family reported that he went to a [facility] the night prior to admission and when he returned home was somnolent; he went to bed and on the morning of admission they noticed that he was confused and unable to follow commands or answer questions. Family also expressed slurred speech. There was not rports for fever, CP, nausea, abd pain, hematuria, urine incontinence, melena, hematochezia or any other complaints. Per EMS patient had signs of dry vomits on his clothes.   In ED, CT scan head neg for acute intracranial abnormalities, CXR without infiltrates, minimal response to narcan and then subsequent severe uncontrolled agitation. UDS was positive for benzodiazepine only; per data base records he received refill for Suboxone on 09/01/18. Ativan given to assist with agitation without any success. Patient started on restrain for safety reason and given haldol X 1 and one dose of Geodon. Finally resting and protecting airways. TRH contacted to place in observation for further evaluation and management.    Assessment & Plan: 1-acute encephalopathy due to suspected drug overdose -UDS positive only for benzodiazepine -Alcohol level negative, negative salicylates and negative acetaminophen. -Patient mentation has failed to improve -Based on records there is concern for Suboxone overdose -Due to agitation and bizarre behavior patient has required to be placed on Precedex and receive another dose of Geodon. -He is still having soft restraints and is on BiPAP for airway protection  and to assist with mild hypercapnia. -Otherwise hemodynamically stable. -Continue to follow clinical response and provide supportive care. -Patient afebrile.  2-GI prophylaxis -Continue Protonix  3-history of hypertension -Continue as needed IV hydralazine  4-mild hypercapnia -Continue BiPAP and wean off as tolerated.   DVT prophylaxis: SCD's Code Status: Full code Family Communication: No family at bedside Disposition Plan: Remains in a stepdown, if possible minimize further medications that can alter his mentation.  Wean BiPAP off and use nasal cannula with eventual subsequent titration of the room air.  Consultants:   None  Procedures:   See below for x-ray reports  Antimicrobials:  Anti-infectives (From admission, onward)   None       Subjective: Patient overnight with ongoing agitation in the requiring initiation of Precedex and also another dose of Geodon.  ABG demonstrated mild hypercapnia and he was placed on BiPAP.  Currently resting in no acute distress with good oxygen saturation and hemodynamically stable.  Objective: Vitals:   09/04/18 1600 09/04/18 1615 09/04/18 1630 09/04/18 1643  BP: 99/63 94/64 99/63    Pulse: 88 89 88   Resp: 14 13 14    Temp:    99 F (37.2 C)  TempSrc:    Axillary  SpO2: 97% 97% 97%   Weight:      Height:        Intake/Output Summary (Last 24 hours) at 09/04/2018 1738 Last data filed at 09/04/2018 1700 Gross per 24 hour  Intake 3537.74 ml  Output 4600 ml  Net -1062.26 ml   Filed Weights   09/03/18 0659 09/03/18 1752  Weight: 79 kg 86.1 kg    Examination: General exam: Sleeping, no following commands, protecting airways  but given ongoing somnolence had developed mild hypercapnia.  He is wearing BiPAP at this time. Respiratory system: Clear to auscultation. Respiratory effort normal. Cardiovascular system:RRR. No murmurs, rubs, gallops. Gastrointestinal system: Abdomen is nondistended, soft and nontender. No  organomegaly or masses felt. Normal bowel sounds heard. Central nervous system: Able to move 4 limbs spontaneously, no following commands and due to ongoing altered mentation unable to properly assess for focal neurologic/cognitive deficits. Extremities: No C/C/E, +pedal pulses Skin: No rashes, lesions or ulcers  Data Reviewed: I have personally reviewed following labs and imaging studies  CBC: Recent Labs  Lab 09/03/18 0711  WBC 9.9  HGB 13.7  HCT 41.5  MCV 91.4  PLT 219   Basic Metabolic Panel: Recent Labs  Lab 09/03/18 0711  NA 138  K 3.8  CL 103  CO2 26  GLUCOSE 105*  BUN 10  CREATININE 0.86  CALCIUM 9.0   GFR: Estimated Creatinine Clearance: 123.3 mL/min (by C-G formula based on SCr of 0.86 mg/dL).   Liver Function Tests: Recent Labs  Lab 09/03/18 0711  AST 29  ALT 42  ALKPHOS 49  BILITOT 0.7  PROT 7.2  ALBUMIN 3.7   CBG: Recent Labs  Lab 09/03/18 0707  GLUCAP 99   Urine analysis:    Component Value Date/Time   COLORURINE YELLOW 01/21/2016 1143   APPEARANCEUR CLEAR 01/21/2016 1143   LABSPEC 1.015 01/21/2016 1143   PHURINE 6.0 01/21/2016 1143   GLUCOSEU NEGATIVE 01/21/2016 1143   HGBUR NEGATIVE 01/21/2016 1143   BILIRUBINUR NEGATIVE 01/21/2016 1143   KETONESUR NEGATIVE 01/21/2016 1143   PROTEINUR NEGATIVE 01/21/2016 1143   NITRITE NEGATIVE 01/21/2016 1143   LEUKOCYTESUR NEGATIVE 01/21/2016 1143    Recent Results (from the past 240 hour(s))  MRSA PCR Screening     Status: None   Collection Time: 09/03/18  5:11 PM  Result Value Ref Range Status   MRSA by PCR NEGATIVE NEGATIVE Final    Comment:        The GeneXpert MRSA Assay (FDA approved for NASAL specimens only), is one component of a comprehensive MRSA colonization surveillance program. It is not intended to diagnose MRSA infection nor to guide or monitor treatment for MRSA infections. Performed at [facility], [address] [place], [place] [address]      Radiology  Studies: Ct Head Wo Contrast  Result Date: 09/03/2018 CLINICAL DATA:  Altered mental status and slurred speech. Concern for drug overdose EXAM: CT HEAD WITHOUT CONTRAST TECHNIQUE: Contiguous axial images were obtained from the base of the skull through the vertex without intravenous contrast. COMPARISON:  None. FINDINGS: Brain: The ventricles are normal in size and configuration. There is no intracranial mass, hemorrhage, extra-axial fluid collection, or midline shift. Brain parenchyma appears unremarkable. No acute infarct evident. Vascular: There is no hyperdense vessel. There is no appreciable vascular calcification. Skull: No fracture evident. There is a mixed attenuation lesion in the left frontal bone which is mildly expansile measuring 1.7 x 1.1 cm. No other bone lesions evident. Sinuses/Orbits: There is mucosal thickening in each maxillary antrum. There is also mucosal thickening in several ethmoid air cells. Orbits appear symmetric bilaterally. Other: Mastoid air cells are clear. IMPRESSION: 1. Normal brain parenchyma. No acute infarct. No intracranial mass or hemorrhage. 2. Mixed attenuation bone lesion in the left frontal bone with mild expansion superficially. 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 No other bone lesions evident. 3.  Mild paranasal sinus disease at several sites. Electronically Signed   By: Bretta Bang III M.D.  On: 09/03/2018 09:24   Dg Chest Port 1 View  Result Date: 09/03/2018 CLINICAL DATA:  Altered mental status. EXAM: PORTABLE CHEST 1 VIEW COMPARISON:  06/09/2008 FINDINGS: The cardiac silhouette appears mildly enlarged, accentuated by portable AP technique and low lung volumes. Bronchovascular crowding is noted in the lung bases. No airspace consolidation, overt pulmonary edema, sizable pleural effusion, or pneumothorax is identified. No acute osseous abnormality is seen. IMPRESSION: No active disease. Electronically Signed   By: Sebastian Ache M.D.   On: 09/03/2018 08:16     Scheduled Meds: . chlorhexidine  15 mL Mouth Rinse BID  . heparin injection (subcutaneous)  5,000 Units Subcutaneous Q8H  . mouth rinse  15 mL Mouth Rinse q12n4p  . pantoprazole (PROTONIX) IV  40 mg Intravenous Q24H   Continuous Infusions: . sodium chloride 125 mL/hr at 09/04/18 1700  . dexmedetomidine (PRECEDEX) IV infusion Stopped (09/04/18 0453)     LOS: 0 days    Time spent: 30 minutes     Vassie Loll, MD [facility] Pager [phone]  If 7PM-7AM, please contact night-coverage [email or web address] Password [facility] 09/04/2018, 5:38 PM

## 2018-09-04 NOTE — Progress Notes (Signed)
Pt woke up from sedation on his own at approx 1830. bipap removed and pt is currently on room air. Pt stated that he was leaving and had no interest in staying at this hospital over night. MD notified. TTS consult ordered. [facility]. RN discussed urgency of situation stating that pt came in with reported suicidal ideation and is now threatening to leave AMA. [facility]. Pt has been encouraged to stay given the nature of his admission. Will continue to monitor.

## 2018-09-04 NOTE — Care Management (Addendum)
CM consulted for medications. Patient not appropriate for assessment at this time.  Discussed during progression rounds with attending. No clear understanding for consult. Will clear consult for now.

## 2018-09-04 NOTE — Progress Notes (Signed)
Hospitalist called to bedside to evaluate patients continued agitation. New order for ativan 1-2 mg.

## 2018-09-04 NOTE — Progress Notes (Signed)
Call to hospitalist to inform of continued restlessness, agitation. New order for precedex.  Informed elink of pt being started on precedex drip.

## 2018-09-04 NOTE — Progress Notes (Signed)
eLink Physician-Brief Progress Note Patient Name: Michael [name] DOB: 08/31/1981 MRN: [id number]   Date of Service  09/04/2018  HPI/Events of Note  Opiate dependence/OD now appears confused and agitated trying to get out of bed despite haloperidol, ativan and Precedex max dose.  eICU Interventions  Ordered Geodon 20 mg IM Qtc 435        Michael [name] 09/04/2018, 3:19 AM

## 2018-09-05 MED ORDER — PANTOPRAZOLE SODIUM 40 MG PO TBEC
40.0000 mg | DELAYED_RELEASE_TABLET | Freq: Every day | ORAL | Status: DC
  Administered 2018-09-05: 40 mg via ORAL
  Filled 2018-09-05: qty 1

## 2018-09-05 MED ORDER — ACETAMINOPHEN 325 MG PO TABS
650.0000 mg | ORAL_TABLET | Freq: Four times a day (QID) | ORAL | Status: DC | PRN
  Administered 2018-09-05 – 2018-09-06 (×4): 650 mg via ORAL
  Filled 2018-09-05 (×4): qty 2

## 2018-09-05 NOTE — Progress Notes (Signed)
Patient meets criteria for inpatient treatment. No appropriate or available beds at [facility]. CSW faxed referrals to the following facilities for review:  [facility], [facility], [facility], [facility], [facility], [address], [facility], [address], [facility], [facility], and [facility].  TTS will continue to seek bed placement.  Vilma MeckelEarl R. Algis GreenhouseForbes, MSW, LCSW Clinical Social Work/Disposition Phone: [phone] Fax: [phone]

## 2018-09-05 NOTE — Progress Notes (Signed)
PROGRESS NOTE    Michael [name]  WGN:[id number] DOB: [birth year] DOA: 09/03/2018 PCP: Patient, No Pcp Per     Brief Narrative:  37 y.o. male with PMH significant for bipolar disorder, polysubstance abuse (heroin, cocaine, tobacco) and chronically on Suboxone; who was bought to ED by his family members due to increase somnolence and AMS. Patient unable to contribute with history due to altered mental status. Family reported that he went to a meeting at [facility] the night prior to admission and when he returned home was somnolent; he went to bed and on the morning of admission they noticed that he was confused and unable to follow commands or answer questions. Family also expressed slurred speech. There was not rports for fever, CP, nausea, abd pain, hematuria, urine incontinence, melena, hematochezia or any other complaints. Per EMS patient had signs of dry vomits on his clothes.   In ED, CT scan head neg for acute intracranial abnormalities, CXR without infiltrates, minimal response to narcan and then subsequent severe uncontrolled agitation. UDS was positive for benzodiazepine only; per data base records he received refill for Suboxone on 09/01/18. Ativan given to assist with agitation without any success. Patient started on restrain for safety reason and given haldol X 1 and one dose of Geodon. Finally resting and protecting airways. TRH contacted to place in observation for further evaluation and management.    Assessment & Plan: 1-acute encephalopathy due to suspected drug overdose -UDS positive only for benzodiazepine (but ativan given in ED and patient reported receiving zolpidem prescription to assist him sleep at night). -Alcohol level negative, negative salicylates and negative acetaminophen. -Based on records there is concern for Suboxone overdose. -No further agitation and would be taken off precedex. -Patient is off BiPAP, oriented x3, no requiring any restraints and  expressing interest to pursued behavioral health treatment to further stabilize his mood. -Hemodynamically stable -Continue to follow clinical response. -Patient afebrile.  2-GI prophylaxis -Continue Protonix; but will change to p.o.  3-history of hypertension -Continue as needed IV hydralazine -Blood pressure stable.  4-mild hypercapnia -Resolved after using BiPAP transiently. -Good oxygen saturation on room air -Denies shortness of breath or respiratory distress.   DVT prophylaxis: SCD's Code Status: Full code Family Communication: No family at bedside Disposition Plan: Patient is medical clearance stable to pursued behavioral health services if found appropriate for inpatient therapy.  Will transfer to MedSurg bed.  Consultants:   None  Procedures:   See below for x-ray reports  Antimicrobials:  Anti-infectives (From admission, onward)   None       Subjective: Patient has woke up and is currently oriented x3 afebrile and in no acute distress.  He reports that no taking or injecting any drugs of another one prescribed to him.  Unable to say if he took all his Suboxone at once.  Very interested to pursued rehabilitation at the behavioral health unit to further stabilize his mood.  Objective: Vitals:   09/05/18 0028 09/05/18 0412 09/05/18 0700 09/05/18 0800  BP:   114/82 112/73  Pulse:   (!) 102 97  Resp:   20 20  Temp: 98.9 F (37.2 C) 98.2 F (36.8 C)    TempSrc: Oral Oral    SpO2:   94% 97%  Weight:      Height:        Intake/Output Summary (Last 24 hours) at 09/05/2018 1013 Last data filed at 09/05/2018 0848 Gross per 24 hour  Intake 1799.43 ml  Output 1100 ml  Net 699.43 ml   Filed Weights   09/03/18 0659 09/03/18 1752  Weight: 79 kg 86.1 kg    Examination: General exam: Alert, awake, oriented x 3; denies chest pain, no shortness of breath, no nausea, no vomiting.  Patient expressed no active suicidal ideation.  Very depressed feeling guilty  and hopeless. Respiratory system: Clear to auscultation. Respiratory effort normal. Cardiovascular system:RRR. No murmurs, rubs, gallops. Gastrointestinal system: Abdomen is nondistended, soft and nontender. No organomegaly or masses felt. Normal bowel sounds heard. Central nervous system: Alert and oriented. No focal neurological deficits. Extremities: No cyanosis or clubbing, good pulses bilaterally.  Patient reports pain in his left shoulder and right arm; no fracture or musculoskeletal injury appreciated on exam. Skin: No rashes, lesions or ulcers Psychiatry: Judgement and insight appear normal.  Flat affect, no agitation.   Data Reviewed: I have personally reviewed following labs and imaging studies  CBC: Recent Labs  Lab 09/03/18 0711  WBC 9.9  HGB 13.7  HCT 41.5  MCV 91.4  PLT 219   Basic Metabolic Panel: Recent Labs  Lab 09/03/18 0711  NA 138  K 3.8  CL 103  CO2 26  GLUCOSE 105*  BUN 10  CREATININE 0.86  CALCIUM 9.0   GFR: Estimated Creatinine Clearance: 123.3 mL/min (by C-G formula based on SCr of 0.86 mg/dL).   Liver Function Tests: Recent Labs  Lab 09/03/18 0711  AST 29  ALT 42  ALKPHOS 49  BILITOT 0.7  PROT 7.2  ALBUMIN 3.7   CBG: Recent Labs  Lab 09/03/18 0707  GLUCAP 99   Urine analysis:    Component Value Date/Time   COLORURINE YELLOW 01/21/2016 1143   APPEARANCEUR CLEAR 01/21/2016 1143   LABSPEC 1.015 01/21/2016 1143   PHURINE 6.0 01/21/2016 1143   GLUCOSEU NEGATIVE 01/21/2016 1143   HGBUR NEGATIVE 01/21/2016 1143   BILIRUBINUR NEGATIVE 01/21/2016 1143   KETONESUR NEGATIVE 01/21/2016 1143   PROTEINUR NEGATIVE 01/21/2016 1143   NITRITE NEGATIVE 01/21/2016 1143   LEUKOCYTESUR NEGATIVE 01/21/2016 1143    Recent Results (from the past 240 hour(s))  MRSA PCR Screening     Status: None   Collection Time: 09/03/18  5:11 PM  Result Value Ref Range Status   MRSA by PCR NEGATIVE NEGATIVE Final    Comment:        The GeneXpert MRSA  Assay (FDA approved for NASAL specimens only), is one component of a comprehensive MRSA colonization surveillance program. It is not intended to diagnose MRSA infection nor to guide or monitor treatment for MRSA infections. Performed at [facility], [address] [place], [place] [address]      Radiology Studies: No results found.  Scheduled Meds: . chlorhexidine  15 mL Mouth Rinse BID  . heparin injection (subcutaneous)  5,000 Units Subcutaneous Q8H  . mouth rinse  15 mL Mouth Rinse q12n4p  . nicotine  21 mg Transdermal Daily  . pantoprazole (PROTONIX) IV  40 mg Intravenous Q24H   Continuous Infusions: . sodium chloride 125 mL/hr at 09/05/18 0505  . dexmedetomidine (PRECEDEX) IV infusion Stopped (09/04/18 0453)     LOS: 1 day    Time spent: 25 minutes   Vassie Lollarlos Reise Hietala, MD [facility] Pager [phone]  If 7PM-7AM, please contact night-coverage [email or web address] Password [email or web address] 09/05/2018, 10:13 AM

## 2018-09-05 NOTE — BH Assessment (Signed)
Reassessment--reassessed pt who states that he was feeling suicidal earlier this week. When asked about if he had any plans to harm himself, pt stated, "well there are a million and one ways to do that--step out in front of a car, jump off a bridge...". He states that he has 2 attempts this year and 2 previous attempts before that. Pt states that he wants treatment and wants to come off of Suboxone and get mental health treatment to "get right". Pt is oriented and says he feels much better than when he came in, but believes that he [facility] another provider [facility] within the last 30 minutes who told him about a bed that he may get for 2 weeks. This did not happen per his RN and sitters. Pt's movement was restless during assessment, but he was pleasant, and states that he needs help with his anxiety and depression. He denies HI, AVH, but states that he remembers the nurse telling him that he was talking to someone who was not there while in the ED.  TTS is seeking placement.

## 2018-09-05 NOTE — Plan of Care (Signed)
  Problem: Education: Goal: Knowledge of General Education information will improve Description Including pain rating scale, medication(s)/side effects and non-pharmacologic comfort measures Outcome: Progressing   Problem: Clinical Measurements: Goal: Respiratory complications will improve Outcome: Progressing Goal: Cardiovascular complication will be avoided Outcome: Progressing   Problem: Activity: Goal: Risk for activity intolerance will decrease Outcome: Progressing   Problem: Nutrition: Goal: Adequate nutrition will be maintained Outcome: Progressing   Problem: Elimination: Goal: Will not experience complications related to urinary retention Outcome: Progressing   Problem: Safety: Goal: Ability to remain free from injury will improve Outcome: Progressing   Problem: Skin Integrity: Goal: Risk for impaired skin integrity will decrease Outcome: Progressing

## 2018-09-06 ENCOUNTER — Encounter (HOSPITAL_COMMUNITY): Payer: Self-pay

## 2018-09-06 ENCOUNTER — Inpatient Hospital Stay (HOSPITAL_COMMUNITY)
Admission: AD | Admit: 2018-09-06 | Discharge: 2018-09-10 | DRG: 885 | Disposition: A | Discharge status: Home / Self Care | Payer: Federal, State, Local not specified - Other | Source: Intra-hospital | Attending: Psychiatry | Admitting: Psychiatry

## 2018-09-06 ENCOUNTER — Other Ambulatory Visit: Payer: Self-pay

## 2018-09-06 DIAGNOSIS — R45851 Suicidal ideations: Secondary | ICD-10-CM | POA: Diagnosis present

## 2018-09-06 DIAGNOSIS — K219 Gastro-esophageal reflux disease without esophagitis: Secondary | ICD-10-CM

## 2018-09-06 DIAGNOSIS — F429 Obsessive-compulsive disorder, unspecified: Secondary | ICD-10-CM | POA: Diagnosis present

## 2018-09-06 DIAGNOSIS — I10 Essential (primary) hypertension: Secondary | ICD-10-CM

## 2018-09-06 DIAGNOSIS — Z915 Personal history of self-harm: Secondary | ICD-10-CM

## 2018-09-06 DIAGNOSIS — F1124 Opioid dependence with opioid-induced mood disorder: Secondary | ICD-10-CM | POA: Diagnosis present

## 2018-09-06 DIAGNOSIS — Z79899 Other long term (current) drug therapy: Secondary | ICD-10-CM

## 2018-09-06 DIAGNOSIS — F332 Major depressive disorder, recurrent severe without psychotic features: Principal | ICD-10-CM | POA: Diagnosis present

## 2018-09-06 DIAGNOSIS — R4182 Altered mental status, unspecified: Secondary | ICD-10-CM

## 2018-09-06 DIAGNOSIS — F10239 Alcohol dependence with withdrawal, unspecified: Secondary | ICD-10-CM | POA: Diagnosis not present

## 2018-09-06 DIAGNOSIS — G4700 Insomnia, unspecified: Secondary | ICD-10-CM | POA: Diagnosis present

## 2018-09-06 DIAGNOSIS — F419 Anxiety disorder, unspecified: Secondary | ICD-10-CM | POA: Diagnosis present

## 2018-09-06 DIAGNOSIS — F17210 Nicotine dependence, cigarettes, uncomplicated: Secondary | ICD-10-CM | POA: Diagnosis present

## 2018-09-06 DIAGNOSIS — F1910 Other psychoactive substance abuse, uncomplicated: Secondary | ICD-10-CM | POA: Diagnosis present

## 2018-09-06 DIAGNOSIS — T50904A Poisoning by unspecified drugs, medicaments and biological substances, undetermined, initial encounter: Secondary | ICD-10-CM

## 2018-09-06 DIAGNOSIS — Z720 Tobacco use: Secondary | ICD-10-CM

## 2018-09-06 MED ORDER — CLONIDINE HCL 0.1 MG PO TABS
0.1000 mg | ORAL_TABLET | Freq: Four times a day (QID) | ORAL | Status: AC
  Administered 2018-09-06 – 2018-09-08 (×8): 0.1 mg via ORAL
  Filled 2018-09-06 (×12): qty 1

## 2018-09-06 MED ORDER — NAPROXEN 500 MG PO TABS
500.0000 mg | ORAL_TABLET | Freq: Two times a day (BID) | ORAL | Status: DC | PRN
  Administered 2018-09-07: 500 mg via ORAL
  Filled 2018-09-06: qty 1

## 2018-09-06 MED ORDER — METHOCARBAMOL 500 MG PO TABS
500.0000 mg | ORAL_TABLET | Freq: Three times a day (TID) | ORAL | Status: DC | PRN
  Administered 2018-09-08: 500 mg via ORAL
  Filled 2018-09-06: qty 1

## 2018-09-06 MED ORDER — HYDROXYZINE HCL 25 MG PO TABS
25.0000 mg | ORAL_TABLET | Freq: Four times a day (QID) | ORAL | Status: DC | PRN
  Administered 2018-09-08: 25 mg via ORAL
  Filled 2018-09-06 (×2): qty 1
  Filled 2018-09-06: qty 30

## 2018-09-06 MED ORDER — BUPRENORPHINE HCL-NALOXONE HCL 8-2 MG SL FILM: 1.0000 | ORAL_FILM | Freq: Two times a day (BID) | SUBLINGUAL | Status: DC

## 2018-09-06 MED ORDER — SERTRALINE HCL 25 MG PO TABS
25.0000 mg | ORAL_TABLET | Freq: Every day | ORAL | Status: DC
  Administered 2018-09-06 – 2018-09-09 (×4): 25 mg via ORAL
  Filled 2018-09-06 (×7): qty 1

## 2018-09-06 MED ORDER — VITAMIN B-1 100 MG PO TABS
100.0000 mg | ORAL_TABLET | Freq: Every day | ORAL | Status: DC
  Administered 2018-09-07 – 2018-09-10 (×4): 100 mg via ORAL
  Filled 2018-09-06 (×7): qty 1

## 2018-09-06 MED ORDER — ADULT MULTIVITAMIN W/MINERALS CH
1.0000 | ORAL_TABLET | Freq: Every day | ORAL | Status: DC
  Administered 2018-09-06 – 2018-09-10 (×5): 1 via ORAL
  Filled 2018-09-06 (×9): qty 1

## 2018-09-06 MED ORDER — ACETAMINOPHEN 325 MG PO TABS
650.0000 mg | ORAL_TABLET | Freq: Four times a day (QID) | ORAL | Status: DC | PRN
  Administered 2018-09-09 (×2): 650 mg via ORAL
  Filled 2018-09-06 (×2): qty 2

## 2018-09-06 MED ORDER — ALUM & MAG HYDROXIDE-SIMETH 200-200-20 MG/5ML PO SUSP: 30.0000 mL | ORAL | Status: DC | PRN

## 2018-09-06 MED ORDER — TRAZODONE HCL 50 MG PO TABS
50.0000 mg | ORAL_TABLET | Freq: Every evening | ORAL | Status: DC | PRN
  Administered 2018-09-06 – 2018-09-09 (×3): 50 mg via ORAL
  Filled 2018-09-06 (×3): qty 1
  Filled 2018-09-06: qty 21
  Filled 2018-09-06: qty 1

## 2018-09-06 MED ORDER — NICOTINE 21 MG/24HR TD PT24
21.0000 mg | MEDICATED_PATCH | Freq: Every day | TRANSDERMAL | Status: DC
  Administered 2018-09-07 – 2018-09-10 (×4): 21 mg via TRANSDERMAL
  Filled 2018-09-06 (×8): qty 1

## 2018-09-06 MED ORDER — CLONIDINE HCL 0.1 MG PO TABS
0.1000 mg | ORAL_TABLET | Freq: Every day | ORAL | Status: DC
  Filled 2018-09-06 (×2): qty 1

## 2018-09-06 MED ORDER — CLONIDINE HCL 0.1 MG PO TABS
0.1000 mg | ORAL_TABLET | ORAL | Status: AC
  Administered 2018-09-08 – 2018-09-10 (×4): 0.1 mg via ORAL
  Filled 2018-09-06 (×4): qty 1

## 2018-09-06 MED ORDER — ONDANSETRON 4 MG PO TBDP: 4.0000 mg | ORAL_TABLET | Freq: Four times a day (QID) | ORAL | Status: DC | PRN

## 2018-09-06 MED ORDER — MAGNESIUM HYDROXIDE 400 MG/5ML PO SUSP: 30.0000 mL | Freq: Every day | ORAL | Status: DC | PRN

## 2018-09-06 MED ORDER — ACETAMINOPHEN 325 MG PO TABS: 650.0000 mg | ORAL_TABLET | Freq: Four times a day (QID) | ORAL | Status: DC | PRN

## 2018-09-06 MED ORDER — LORAZEPAM 1 MG PO TABS
1.0000 mg | ORAL_TABLET | Freq: Four times a day (QID) | ORAL | Status: AC | PRN
  Filled 2018-09-06: qty 1

## 2018-09-06 MED ORDER — GABAPENTIN 100 MG PO CAPS
100.0000 mg | ORAL_CAPSULE | Freq: Three times a day (TID) | ORAL | Status: DC
  Administered 2018-09-06 – 2018-09-10 (×13): 100 mg via ORAL
  Filled 2018-09-06 (×7): qty 1
  Filled 2018-09-06: qty 63
  Filled 2018-09-06 (×4): qty 1
  Filled 2018-09-06: qty 63
  Filled 2018-09-06 (×3): qty 1
  Filled 2018-09-06: qty 63
  Filled 2018-09-06 (×6): qty 1

## 2018-09-06 MED ORDER — LOPERAMIDE HCL 2 MG PO CAPS: 2.0000 mg | ORAL_CAPSULE | ORAL | Status: DC | PRN

## 2018-09-06 MED ORDER — NICOTINE 21 MG/24HR TD PT24: 21.0000 mg | MEDICATED_PATCH | Freq: Every day | TRANSDERMAL | Status: DC

## 2018-09-06 MED ORDER — PANTOPRAZOLE SODIUM 40 MG PO TBEC
40.0000 mg | DELAYED_RELEASE_TABLET | Freq: Every day | ORAL | Status: DC
  Administered 2018-09-06 – 2018-09-10 (×5): 40 mg via ORAL
  Filled 2018-09-06 (×6): qty 1
  Filled 2018-09-06: qty 21
  Filled 2018-09-06 (×3): qty 1

## 2018-09-06 MED ORDER — PANTOPRAZOLE SODIUM 40 MG PO TBEC: 40.0000 mg | DELAYED_RELEASE_TABLET | Freq: Every day | ORAL | Status: DC

## 2018-09-06 MED ORDER — DICYCLOMINE HCL 20 MG PO TABS: 20.0000 mg | ORAL_TABLET | Freq: Four times a day (QID) | ORAL | Status: DC | PRN

## 2018-09-06 NOTE — Progress Notes (Addendum)
[facility], Bed 300[facility]1 Oletta Cohnaneka Lewis, NP is the accepting provider.  Dr. Nehemiah MassedFernando Cobos, MD, is the attending provider.  Call report to [phone]  [email or web address] notified.   Pt is Voluntary.  Pt may be transported by [facility]  Pt scheduled  to arrive as soon as transport can be arranged.  Timmothy EulerJean T. Kaylyn LimSutter, MSW, LCSWA Disposition Clinical Social Work [phone] (cell) [phone] (office)

## 2018-09-06 NOTE — Progress Notes (Signed)
Patient accepted [facility] room 300-1.Service of Dr. Jola Babinskilary, MD. Please call report to [phone]. Bed available at 0800. Rosey BathKelly Nayellie Sanseverino, RN

## 2018-09-06 NOTE — Tx Team (Signed)
Initial Treatment Plan 09/06/2018 1:10 PM Michael Hewobert Jackson Kooyman [name] ZOX:[id number]    PATIENT STRESSORS: Financial difficulties Substance abuse   PATIENT STRENGTHS: Motivation for treatment/growth Physical Health   PATIENT IDENTIFIED PROBLEMS: 1. "Get off the suboxone"  2. "Get on a medicine that will help with anxiety and with sleep."  3. "I want to be happy."                 DISCHARGE CRITERIA:  Adequate post-discharge living arrangements Improved stabilization in mood, thinking, and/or behavior Verbal commitment to aftercare and medication compliance Withdrawal symptoms are absent or subacute and managed without 24-hour nursing intervention  PRELIMINARY DISCHARGE PLAN: Attend 12-step recovery group Outpatient therapy  PATIENT/FAMILY INVOLVEMENT: This treatment plan has been presented to and reviewed with the patient, Michael [name].  The patient has been given the opportunity to ask questions and make suggestions.  Kirstie MirzaJonathan C Courteney Alderete, RN 09/06/2018, 1:10 PM

## 2018-09-06 NOTE — [facility] Group Notes (Signed)
[facility] Group Notes:  (Nursing/MHT/Case Management/Adjunct)  Date:  09/06/2018  Time:  4:25 PM  Type of Therapy:  Nurse Education  Participation Level:  Active  Participation Quality:  Appropriate and Attentive  Affect:  Appropriate  Cognitive:  Alert and Appropriate  Insight:  Good  Engagement in Group:  Engaged  Modes of Intervention:  Discussion and Education  Summary of Progress/Problems: This group focused on Jillyn HiddenGary Chapman's 5 love languages. We discussed definitions of each of the different love languages. We also covered healthy boundaries in our relationships. The group was given homework to take a quiz and find out what their love language is and apply that to their relationships.  Kirstie MirzaJonathan C Mattison Stuckey 09/06/2018, 4:25 PM

## 2018-09-06 NOTE — Progress Notes (Signed)
Report called to Jon[facility]John at [facility]. [facility] will be [facility]. PIV removed. AVS paperwork will be given to transportation.

## 2018-09-06 NOTE — Progress Notes (Signed)
Patient did attend the evening speaker AA meeting. Pt did come in and out of group 3 or 4 times but did stay until end of group.

## 2018-09-06 NOTE — [facility] Suicide Risk Assessment (Signed)
[facility]   Nursing information obtained from:  Patient Demographic factors:  Male, Caucasian, Adolescent or young adult, Low socioeconomic status Current Mental Status:  Self-harm behaviors Loss Factors:  Financial problems / change in socioeconomic status Historical Factors:  Prior suicide attempts, Impulsivity Risk Reduction Factors:  Living with another person, especially a relative  Total Time spent with patient: 45 minutes Principal Problem: Opiate Dependence, Cocaine Dependence, MDD versus Substance Induced Mood Disorder  Diagnosis:   Patient Active Problem List   Diagnosis Date Noted  . Severe recurrent major depression without psychotic features (HCC) [F33.2] 09/06/2018  . AMS (altered mental status) [R41.82]   . Essential hypertension [I10]   . Gastroesophageal reflux disease [K21.9]   . Drug overdose [T50.901A] 09/03/2018  . OCD (obsessive compulsive disorder) [F42.9] 04/13/2018  . Major depressive disorder, recurrent severe without psychotic features (HCC) [F33.2] 04/06/2018  . Cannabis use disorder, moderate, dependence (HCC) [F12.20] 04/06/2018  . Cocaine use disorder, moderate, dependence (HCC) [F14.20] 04/06/2018  . Sedative, hypnotic or anxiolytic use disorder, severe, dependence (HCC) [F13.20] 04/06/2018  . Tobacco abuse [Z72.0] 04/06/2018   Subjective Data:   Continued Clinical Symptoms:    The "Alcohol Use Disorders Identification Test", Guidelines for Use in Primary Care, Second Edition.  World Science writerHealth Organization [facility](WHO). Score between 0-7:  no or low risk or alcohol related problems. Score between 8-15:  moderate risk of alcohol related problems. Score between 16-19:  high risk of alcohol related problems. Score 20 or above:  warrants further diagnostic evaluation for alcohol dependence and treatment.   CLINICAL FACTORS:  37 year old male, presented to ED with confusion, somnolence, status changes.  Required medical admission  initially . Admission UDS was positive for benzodiazepines.  Blood alcohol level was negative. He reports a long history of substance abuse, primarily opiates (heroin) and cocaine (crack).  He also reports he is prescribed Suboxone.  He endorses chronic depression, anxiety, neurovegetative symptoms of depression.  Currently he is not presenting with symptoms of benzodiazepine withdrawal-no psychomotor restlessness, no diaphoresis, no tremors.  He does endorse some muscular aches/myalgias. He is interested in detoxing off opiates completely and does not want to continue Suboxone management at this time.  Psychiatric Specialty Exam: Physical Exam  ROS  Blood pressure 124/84, pulse (!) 102, temperature 98.9 F (37.2 C), temperature source Oral, resp. rate 16, height 5\' 6"  (1.676 m), weight 83.9 kg, SpO2 100 %.Body mass index is 29.86 kg/m.  See admit note MSE   COGNITIVE FEATURES THAT CONTRIBUTE TO RISK:  Closed-mindedness and Loss of executive function    SUICIDE RISK:   Moderate:  Frequent suicidal ideation with limited intensity, and duration, some specificity in terms of plans, no associated intent, good self-control, limited dysphoria/symptomatology, some risk factors present, and identifiable protective factors, including available and accessible social support.  PLAN OF CARE: Patient will be admitted to inpatient psychiatric unit for stabilization and safety. Will provide and encourage milieu participation.  We will also provide medication management to address potential withdrawal symptoms- provide medication management and maked adjustments as needed.  Will follow daily.    I certify that inpatient services furnished can reasonably be expected to improve the patient's condition.   Craige CottaFernando A , MD 09/06/2018, 1:49 PM
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
## 2018-09-06 NOTE — H&P (Addendum)
Psychiatric Admission Assessment Adult  Patient Identification: Michael [name] MRN:  [id number] Date of Evaluation:  09/06/2018 Chief Complaint:  " I feel I am screwed up and I am trying to get fixed" Principal Diagnosis: Opiate Dependence, Cocaine Use Disorder, Substance Induced Mood Disorder versus MDD  Diagnosis:   Patient Active Problem List   Diagnosis Date Noted  . Severe recurrent major depression without psychotic features (HCC) [F33.2] 09/06/2018  . AMS (altered mental status) [R41.82]   . Essential hypertension [I10]   . Gastroesophageal reflux disease [K21.9]   . Drug overdose [T50.901A] 09/03/2018  . OCD (obsessive compulsive disorder) [F42.9] 04/13/2018  . Major depressive disorder, recurrent severe without psychotic features (HCC) [F33.2] 04/06/2018  . Cannabis use disorder, moderate, dependence (HCC) [F12.20] 04/06/2018  . Cocaine use disorder, moderate, dependence (HCC) [F14.20] 04/06/2018  . Sedative, hypnotic or anxiolytic use disorder, severe, dependence (HCC) [F13.20] 04/06/2018  . Tobacco abuse [Z72.0] 04/06/2018   History of Present Illness: 37 year old male, presented to ED with family, due to mental status changes, somnolence.  Required Geodon for agitation. Patient reports he feels this was due to " being tired that day". He endorses history of opiate ( heroin) , cocaine ( crack)  use disorder . Endorses using Xanax at times, but denies any recent BZD abuse . Denies IVDA. Of note, admission UDS was positive for BZD. BAL was negative. He states he is unsure exactly  when he last used above substances. States " I guess it was about 5 days ago). Patient endorses history of depression and states, " I feel I have been depressed most of my life ". Denies suicidal plans or intentions , but does endorse some passive thoughts of death, dying and states " I don't think anybody is going to miss me". Endorses neuro-vegetative symptoms as below. Associated  Signs/Symptoms: Depression Symptoms:  depressed mood, anhedonia, insomnia, suicidal thoughts without plan, loss of energy/fatigue, decreased appetite, (Hypo) Manic Symptoms:  irritability Anxiety Symptoms:  Reports panic attacks and worrying, feeling apprehensive  Psychotic Symptoms:  Reports intermittent auditory hallucinations, states he hears name being called - currently does not appear internally preoccupied . PTSD Symptoms: Does not endorse  Total Time spent with patient: 45 minutes  Past Psychiatric History: Reports prior psychiatric admission in June/2019, for depression, anxiety, suicidal ideations, substance abuse. Reports history of chronic depression and reports a suicide attempt by hanging self at age 69. Describes intermittent auditory hallucinations, such as hearing name being called . Does not currently endorse mania or hypomania.  Denies history of violence    Is the patient at risk to self? Yes.    Has the patient been a risk to self in the past 6 months? Yes.    Has the patient been a risk to self within the distant past? Yes.    Is the patient a risk to others? No.  Has the patient been a risk to others in the past 6 months? No.  Has the patient been a risk to others within the distant past? No.   Prior Inpatient Therapy:  as above  Prior Outpatient Therapy:  Follows up at [facility] .  Alcohol Screening: 1. How often do you have a drink containing alcohol?: Never 2. How many drinks containing alcohol do you have on a typical day when you are drinking?: 1 or 2 3. How often do you have six or more drinks on one occasion?: Never AUDIT-C Score: 0 Intervention/Follow-up: AUDIT Score <7 follow-up not indicated Substance  Abuse History in the last 12 months:  Denies history of alcohol dependence, reports history of opiate ( heroin) and cocaine ( crack) dependencies. Reports occasional Xanax abuse, but not regularly.  Denies cannabis abuse .  Longest period of sobriety 8  months. Consequences of Substance Abuse: Relationship, family stressors Previous Psychotropic Medications:  States he has been on Suboxone x 1 month.  States he has not been on any psychiatric medications recently. States he was on an antidepressant " a long time ago" but does not remember name .  Psychological Evaluations:  No  Past Medical History: Denies medical illnesses . NKDA.  Past Medical History:  Diagnosis Date  . Polysubstance abuse (HCC)   . Sciatica   . Shoulder dislocation     Past Surgical History:  Procedure Laterality Date  . ELBOW FRACTURE SURGERY     Family History: father passed away before patient was born, in a MVA. Patient had been living with mother prior to admission . Has two brothers . Family Psychiatric  History: Reports history of depression in extended family, a maternal uncle committed suicide, father had history of substance use disorder. Tobacco Screening: smokes 1 PPD Social History: single, no children, currently homeless, unemployed, states he had been living with mother but states he does not think he would be able to return there. Denies legal issues . Social History   Substance and Sexual Activity  Alcohol Use Yes   Comment: "rarely"     Social History   Substance and Sexual Activity  Drug Use Yes  . Types: IV, Benzodiazepines   Comment: Heroin, Crack yesterday    Additional Social History:  Allergies:  No Known Allergies Lab Results: No results found for this or any previous visit (from the past 48 hour(s)).  Blood Alcohol level:  Lab Results  Component Value Date   ETH <10 09/03/2018   ETH <10 08/19/2018    Metabolic Disorder Labs:  No results found for: HGBA1C, MPG No results found for: PROLACTIN Lab Results  Component Value Date   CHOL 139 04/06/2018   TRIG 146 04/06/2018   HDL 37 (L) 04/06/2018   CHOLHDL 3.8 04/06/2018   VLDL 29 04/06/2018   LDLCALC 73 04/06/2018    Current Medications: Current  Facility-Administered Medications  Medication Dose Route Frequency Provider Last Rate Last Dose  . acetaminophen (TYLENOL) tablet 650 mg  650 mg Oral Q6H PRN Jackelyn Poling, NP      . alum & mag hydroxide-simeth (MAALOX/MYLANTA) 200-200-20 MG/5ML suspension 30 mL  30 mL Oral Q4H PRN Nira Conn A, NP      . magnesium hydroxide (MILK OF MAGNESIA) suspension 30 mL  30 mL Oral Daily PRN Nira Conn A, NP      . pantoprazole (PROTONIX) EC tablet 40 mg  40 mg Oral Daily Nira Conn A, NP       PTA Medications: Medications Prior to Admission  Medication Sig Dispense Refill Last Dose  . acetaminophen (TYLENOL) 325 MG tablet Take 2 tablets (650 mg total) by mouth every 6 (six) hours as needed for moderate pain, fever or headache.     . Buprenorphine HCl-Naloxone HCl 8-2 MG FILM Place 1 Film under the tongue 2 (two) times daily.     . cloNIDine (CATAPRES) 0.1 MG tablet Take 0.1 mg by mouth at bedtime.   0 Past Week at Unknown time  . nicotine (NICODERM CQ - DOSED IN MG/24 HOURS) 21 mg/24hr patch Place 1 patch (21 mg total) onto the  skin daily.     . pantoprazole (PROTONIX) 40 MG tablet Take 1 tablet (40 mg total) by mouth daily.       Musculoskeletal: Strength & Muscle Tone: within normal limits- no tremors, no diaphoresis, no psychomotor restlessness or agitation at this time.  Gait & Station: normal Patient leans: N/A  Psychiatric Specialty Exam: Physical Exam  Review of Systems  Constitutional: Negative.   HENT: Negative.   Eyes: Negative.   Respiratory: Negative.   Cardiovascular: Negative.   Gastrointestinal: Negative.   Genitourinary: Negative.   Musculoskeletal: Positive for myalgias.  Skin: Negative.   Neurological: Negative for seizures and headaches.  Endo/Heme/Allergies: Negative.   Psychiatric/Behavioral: Positive for depression and substance abuse. The patient is nervous/anxious.     Blood pressure 124/84, pulse (!) 102, temperature 98.9 F (37.2 C), temperature source  Oral, resp. rate 16, height 5\' 6"  (1.676 m), weight 83.9 kg, SpO2 100 %.Body mass index is 29.86 kg/m.  General Appearance: Fairly Groomed  Eye Contact:  Fair  Speech:  Normal Rate  Volume:  Decreased  Mood:  reports feeling depressed  Affect:  vaguely dysphoric, irritable  Thought Process:  Linear and Descriptions of Associations: Intact  Orientation:  Full (Time, Place, and Person)  Thought Content:  reports hallucinations, does not currently present internally preoccupied, no delusions are expressed   Suicidal Thoughts:  No denies any suicidal or self injurious ideations, denies homicidal or violent ideations, currently contracts for safety  Homicidal Thoughts:  No  Memory:  recent and remote grossly intact   Judgement:  Fair  Insight:  Fair  Psychomotor Activity:  Decreased- no current symptoms of BZD WDL noted- no restlessness, no psychomotor agitation, no restlessness   Concentration:  Concentration: Fair and Attention Span: Fair  Recall:  Good  Fund of Knowledge:  Good  Language:  Good  Akathisia:  Negative  Handed:  Left  AIMS (if indicated):     Assets:  Desire for Improvement Resilience  ADL's:  Intact  Cognition:  WNL  Sleep:       Treatment Plan Summary: Daily contact with patient to assess and evaluate symptoms and progress in treatment, Medication management, Plan inpatient treatment  and medications as below  Observation Level/Precautions:  15 minute checks  Laboratory:  as needed   Psychotherapy:  [place], group therapy   Medications:  We reviewed options with patient. Patient states he is not wanting to continue Suboxone, states " I want to come off all that stuff, don't want to be on any opiates ". Currently not presenting with significant WDL symptoms, does endorse some myalgias, aches . Agrees to Clonidine detox protocol. Will start Neurontin 100 mgrs TID for anxiety and chronic pain. Agrees to antidepressant trial, start Zoloft 25 mgrs QDAY initially  . * (+) history of BZD abuse, admission UDS (+) for BZDs , and report indicates patient did not respond to Narcan on admission. No current symptoms of BZD WDL, but will start Ativan PRNs to address potential BZD WDL as per CIWA.   Consultations:  As needed   Discharge Concerns:  - patient expresses interest in going to a rehab at discharge  Estimated LOS: 4-5 days   Other:     Physician Treatment Plan for Primary Diagnosis:  Opiate Use Disorder, Cocaine Use Disorder  Long Term Goal(s): Improvement in symptoms so as ready for discharge  Short Term Goals: Ability to identify triggers associated with substance abuse/mental health issues will improve  Physician Treatment Plan for Secondary Diagnosis: MDD  versus Substance Induced Mood Disorder  Long Term Goal(s): Improvement in symptoms so as ready for discharge  Short Term Goals: Ability to identify changes in lifestyle to reduce recurrence of condition will improve, Ability to verbalize feelings will improve, Ability to disclose and discuss suicidal ideas, Ability to demonstrate self-control will improve, Ability to identify and develop effective coping behaviors will improve and Ability to maintain clinical measurements within normal limits will improve  I certify that inpatient services furnished can reasonably be expected to improve the patient's condition.    Craige CottaFernando A Philicia Heyne, MD 11/17/20191:03 PM

## 2018-09-06 NOTE — Discharge Summary (Signed)
Physician Discharge Summary  Michael [name] VWU:[id number] DOB: 11/18/1980 DOA: 09/03/2018  PCP: Patient, No Pcp Per  Admit date: 09/03/2018 Discharge date: 09/06/2018  Time spent: 35 minutes  Recommendations for Outpatient Follow-up:  1. Repeat basic metabolic panel in 1 week to follow electrolytes and renal function trend. 2. Behavioral management and mood stabilization as per behavioral health hospital protocol.   Discharge Diagnoses:  None intentional drug overdose Tobacco abuse AMS (altered mental status) Essential hypertension Gastroesophageal reflux disease Anxiety/depression  Discharge Condition: Stable and improved.  Patient will be discharged to behavioral health hospital for further management of his mood disorders.  Diet recommendation: heart healthy diet   Filed Weights   09/03/18 0659 09/03/18 1752  Weight: 79 kg 86.1 kg    History of present illness:  37 y.o.malewith PMH significant for bipolar disorder, polysubstance abuse (heroin, cocaine, tobacco) and chronically on Suboxone; who was bought to ED by his family members due to increase somnolence and AMS. Patient unable to contribute with history due to altered mental status. Family reported that [facility] and when he returned home was somnolent; he went to bed and on the morning of admission they noticed that he was confused and unable to follow commands or answer questions. Family also expressed slurred speech. There was not rports for fever, CP, nausea, abd pain, hematuria, urine incontinence, melena, hematochezia or any other complaints. Per EMS patient had signs of dry vomits on his clothes.   In ED, CT scan head neg for acute intracranial abnormalities, CXR without infiltrates, minimal response to narcan and then subsequent severe uncontrolled agitation. UDS was positive for benzodiazepine only; per data base records he received refill for Suboxone  on 09/01/18. Ativan given to assist with agitation without any success. Patient started on restrain for safety reason and given haldol X 1 and one dose of Geodon. Finally resting and protecting airways. TRH contacted to place in observation for further evaluation and management.  Hospital Course:  1-acute encephalopathy due to suspected drug overdose -UDS positive only for benzodiazepine (but ativan given in ED and patient reported receiving zolpidem prescription to assist him sleep).  For now we will continue holding sleeping 8 medications; in order to minimize polypharmacy and make easier to clean start [facility]. -Alcohol level negative, negative salicylates and negative acetaminophen. -Based on records there is concern for Suboxone overdose. -No further agitation and was successfully taken off precedex. -Patient is off BiPAP, oriented x3, no requiring any restraints and expressing interest to pursued behavioral health treatment to further stabilize his mood. -Hemodynamically stable -Continue to follow clinical response.  2-anxiety/depression-currently no suggesting hallucinations or active suicidal ideation -Will continue to have feeling of guilty and hopelessness -Plan is to be discharged to behavioral health Hospital for acute management and treatment of his mood disorders with initiation on medical therapy and psychotherapy.  3-history of hypertension -Blood pressure stable and rising now. -will resume clonidine.  4-mild hypercapnia -Resolved after using BiPAP transiently. -Good oxygen saturation on room air -Patient denies shortness of breath or any respiratory complaints.  5-GERD/GI prophylaxis -continue PPI  6-tobacco abuse -cessation counseling provided -continue nicotine patch   Procedures:  See below for x-ray reports.  Consultations:  Psychiatry  Discharge Exam: Vitals:   09/05/18 2108 09/06/18 0125  BP:  (!) 144/85  Pulse:  73  Resp:  18   Temp:  98.5 F (36.9 C)  SpO2: 94% 98%  General exam: Alert, awake, oriented x 3; denies chest pain, no shortness of breath, nausea or vomiting.  Patient expressed no active suicidal ideation.  Continue to suppress ongoing depression, feeling guilty and hopeless.  Patient is afebrile. Respiratory system: Clear to auscultation. Respiratory effort normal. Cardiovascular system:RRR. No murmurs, rubs, gallops. Gastrointestinal system: Abdomen is nondistended, soft and nontender. No organomegaly or masses felt. Normal bowel sounds heard. Central nervous system: Alert and oriented. No focal neurological deficits. Extremities: No cyanosis or clubbing, good pulses bilaterally.  Patient reports pain in his left shoulder and right arm are better today. Skin: No rashes, lesions or ulcers Psychiatry: Judgement and insight appear normal.  Flat affect, no agitation.   Following commands appropriately.   Discharge Instructions   Discharge Instructions    Diet - low sodium heart healthy   Complete by:  As directed    Discharge instructions   Complete by:  As directed    Maintain adequate hydration Take medications as prescribed Follow heart healthy diet.     Allergies as of 09/06/2018   No Known Allergies     Medication List    TAKE these medications   acetaminophen 325 MG tablet Commonly known as:  TYLENOL Take 2 tablets (650 mg total) by mouth every 6 (six) hours as needed for moderate pain, fever or headache.   Buprenorphine HCl-Naloxone HCl 8-2 MG Film Place 1 Film under the tongue 2 (two) times daily.   cloNIDine 0.1 MG tablet Commonly known as:  CATAPRES Take 0.1 mg by mouth at bedtime.   nicotine 21 mg/24hr patch Commonly known as:  NICODERM CQ - dosed in mg/24 hours Place 1 patch (21 mg total) onto the skin daily.   pantoprazole 40 MG tablet Commonly known as:  PROTONIX Take 1 tablet (40 mg total) by mouth daily.      No Known Allergies   The results of  significant diagnostics from this hospitalization (including imaging, microbiology, ancillary and laboratory) are listed below for reference.    Significant Diagnostic Studies: Ct Head Wo Contrast  Result Date: 09/03/2018 CLINICAL DATA:  Altered mental status and slurred speech. Concern for drug overdose EXAM: CT HEAD WITHOUT CONTRAST TECHNIQUE: Contiguous axial images were obtained from the base of the skull through the vertex without intravenous contrast. COMPARISON:  None. FINDINGS: Brain: The ventricles are normal in size and configuration. There is no intracranial mass, hemorrhage, extra-axial fluid collection, or midline shift. Brain parenchyma appears unremarkable. No acute infarct evident. Vascular: There is no hyperdense vessel. There is no appreciable vascular calcification. Skull: No fracture evident. There is a mixed attenuation lesion in the left frontal bone which is mildly expansile measuring 1.7 x 1.1 cm. No other bone lesions evident. Sinuses/Orbits: There is mucosal thickening in each maxillary antrum. There is also mucosal thickening in several ethmoid air cells. Orbits appear symmetric bilaterally. Other: Mastoid air cells are clear. IMPRESSION: 1. Normal brain parenchyma. No acute infarct. No intracranial mass or hemorrhage. 2. Mixed attenuation bone lesion in the left frontal bone with mild expansion superficially. 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 No other bone lesions evident. 3.  Mild paranasal sinus disease at several sites. Electronically Signed   By: Bretta Bang [name] M.D.   On: 09/03/2018 09:24   Dg Chest Port 1 View  Result Date: 09/03/2018 CLINICAL DATA:  Altered mental status. EXAM: PORTABLE CHEST 1 VIEW COMPARISON:  06/09/2008 FINDINGS: The cardiac silhouette appears mildly enlarged, accentuated by portable AP technique and low lung volumes. Bronchovascular crowding is noted in  the lung bases. No airspace consolidation, overt pulmonary edema, sizable pleural effusion,  or pneumothorax is identified. No acute osseous abnormality is seen. IMPRESSION: No active disease. Electronically Signed   By: Sebastian Ache M.D.   On: 09/03/2018 08:16    Microbiology: Recent Results (from the past 240 hour(s))  MRSA PCR Screening     Status: None   Collection Time: 09/03/18  5:11 PM  Result Value Ref Range Status   MRSA by PCR NEGATIVE NEGATIVE Final    Comment:        The GeneXpert MRSA Assay (FDA approved for NASAL specimens only), is one component of a comprehensive MRSA colonization surveillance program. It is not intended to diagnose MRSA infection nor to guide or monitor treatment for MRSA infections. Performed at [facility], [address], [place], [place] [address]      Labs: Basic Metabolic Panel: Recent Labs  Lab 09/03/18 0711  NA 138  K 3.8  CL 103  CO2 26  GLUCOSE 105*  BUN 10  CREATININE 0.86  CALCIUM 9.0   Liver Function Tests: Recent Labs  Lab 09/03/18 0711  AST 29  ALT 42  ALKPHOS 49  BILITOT 0.7  PROT 7.2  ALBUMIN 3.7   CBC: Recent Labs  Lab 09/03/18 0711  WBC 9.9  HGB 13.7  HCT 41.5  MCV 91.4  PLT 219    CBG: Recent Labs  Lab 09/03/18 0707  GLUCAP 99    Signed:  Vassie Loll MD.  [facility] 09/06/2018, 7:59 AM

## 2018-09-06 NOTE — Progress Notes (Signed)
Slept approximately 2 hours during this night.  Has been up, talking constantly and requesting food and drink all night.

## 2018-09-07 DIAGNOSIS — F10239 Alcohol dependence with withdrawal, unspecified: Secondary | ICD-10-CM

## 2018-09-07 MED ORDER — GUAIFENESIN-DM 100-10 MG/5ML PO SYRP
5.0000 mL | ORAL_SOLUTION | ORAL | Status: DC | PRN
  Administered 2018-09-07 – 2018-09-10 (×5): 5 mL via ORAL
  Filled 2018-09-07 (×6): qty 5

## 2018-09-07 NOTE — Plan of Care (Signed)
  Problem: Safety: Goal: Periods of time without injury will increase Outcome: Progressing   Problem: Education: Goal: Verbalization of understanding the information provided will improve Outcome: Not Progressing

## 2018-09-07 NOTE — Progress Notes (Signed)
Recreation Therapy Notes  Date: 11.18.19 Time: 0930 Location: [address] Dayroom  Group Topic: Stress Management  Goal Area(s) Addresses:  Patient will verbalize importance of using healthy stress management.  Patient will identify positive emotions associated with healthy stress management.   Intervention: Stress Management  Activity :  Guided Imagery.  LRT introduced the stress management technique of guided imagery.  LRT read a script that took patients on a journey through the forest.  Patients were to listen as the script was read to engage in the activity.  Education:  Stress Management, Discharge Planning.   Education Outcome: Acknowledges edcuation/In group clarification offered/Needs additional education  Clinical Observations/Feedback: Pt did not attend group.     Caroll RancherMarjette Rossanna Spitzley,  LRT/CTRS         Lillia AbedLindsay, Yajahira Tison A 09/07/2018 11:26 AM

## 2018-09-07 NOTE — Progress Notes (Signed)
[facility] MD Progress Note  09/07/2018 12:58 PM Michael [name]  MRN:  [id number] Subjective:    History as per psychiatric intake: 37 year old male, presented to ED with family, due to mental status changes, somnolence.  Required Geodon for agitation. Patient reports he feels this was due to " being tired that day". He endorses history of opiate ( heroin) , cocaine ( crack)  use disorder . Endorses using Xanax at times, but denies any recent BZD abuse . Denies IVDA. Of note, admission UDS was positive for BZD. BAL was negative. He states he is unsure exactly  when he last used above substances. States " I guess it was about 5 days ago). Patient endorses history of depression and states, " I feel I have been depressed most of my life ". Denies suicidal plans or intentions , but does endorse some passive thoughts of death, dying and states " I don't think anybody is going to miss me". Endorses neuro-vegetative symptoms as below.   As per evaluation: Today upon evaluation, pt shares about his reasons for admission, stating, "My parents said I was going to drug overdose. They took me to the hospital and I had a little scuffle." Pt is somewhat drowsy at time of interview, but he is able to fully participate in the interview. Pt confirms he has been abusing heroin, crack cocaine, methamphetamine, valium and xanax illicitly. He reports some ongoing depression and anxiety, but overall he is feeling better since being in the hospital. He notes that he is feeling drowsy but it is tolerable. He denies any other specific concerns. He is sleeping well. His appetite is good. He denies other physical complaints. He denies SI/HI/AH/VH. He is tolerating his medications well, and he is in agreement to continue his current regimen without changes. He will be continued on CIWA and COWS. He is in agreement for referral to residential substance use treatment, and he will work with the SW team regarding  referrals.  Principal Problem: Severe recurrent major depression without psychotic features (HCC) Diagnosis: Principal Problem:   Severe recurrent major depression without psychotic features (HCC) Active Problems:   Opioid dependence with opioid-induced mood disorder (HCC)  Total Time spent with patient: 30 minutes  Past Psychiatric History: see H&P  Past Medical History:  Past Medical History:  Diagnosis Date  . Polysubstance abuse (HCC)   . Sciatica   . Shoulder dislocation     Past Surgical History:  Procedure Laterality Date  . ELBOW FRACTURE SURGERY     Family History: History reviewed. No pertinent family history. Family Psychiatric  History: see H&P Social History:  Social History   Substance and Sexual Activity  Alcohol Use Yes   Comment: "rarely"     Social History   Substance and Sexual Activity  Drug Use Yes  . Types: IV, Benzodiazepines   Comment: Heroin, Crack yesterday    Social History   Socioeconomic History  . Marital status: Single    Spouse name: Not on file  . Number of children: Not on file  . Years of education: Not on file  . Highest education level: Not on file  Occupational History  . Not on file  Social Needs  . Financial resource strain: Not on file  . Food insecurity:    Worry: Not on file    Inability: Not on file  . Transportation needs:    Medical: Not on file    Non-medical: Not on file  Tobacco Use  .  Smoking status: Current Every Day Smoker    Packs/day: 1.00    Types: Cigarettes  . Smokeless tobacco: Never Used  . Tobacco comment: Patient refused smoking cessation education  Substance and Sexual Activity  . Alcohol use: Yes    Comment: "rarely"  . Drug use: Yes    Types: IV, Benzodiazepines    Comment: Heroin, Crack yesterday  . Sexual activity: Yes    Birth control/protection: None  Lifestyle  . Physical activity:    Days per week: Not on file    Minutes per session: Not on file  . Stress: Not on file   Relationships  . Social connections:    Talks on phone: Not on file    Gets together: Not on file    Attends religious service: Not on file    Active member of club or organization: Not on file    Attends meetings of clubs or organizations: Not on file    Relationship status: Not on file  Other Topics Concern  . Not on file  Social History Narrative  . Not on file   Additional Social History:                         Sleep: Good  Appetite:  Good  Current Medications: Current Facility-Administered Medications  Medication Dose Route Frequency Provider Last Rate Last Dose  . acetaminophen (TYLENOL) tablet 650 mg  650 mg Oral Q6H PRN Jackelyn Poling, NP      . alum & mag hydroxide-simeth (MAALOX/MYLANTA) 200-200-20 MG/5ML suspension 30 mL  30 mL Oral Q4H PRN Nira Conn A, NP      . cloNIDine (CATAPRES) tablet 0.1 mg  0.1 mg Oral QID Cobos, Rockey Situ, MD   0.1 mg at 09/07/18 1213   Followed by  . [START ON 09/08/2018] cloNIDine (CATAPRES) tablet 0.1 mg  0.1 mg Oral BH-qamhs Cobos, Rockey Situ, MD       Followed by  . [START ON 09/11/2018] cloNIDine (CATAPRES) tablet 0.1 mg  0.1 mg Oral QAC breakfast Cobos, Rockey Situ, MD      . dicyclomine (BENTYL) tablet 20 mg  20 mg Oral Q6H PRN Cobos, Rockey Situ, MD      . gabapentin (NEURONTIN) capsule 100 mg  100 mg Oral TID Cobos, Rockey Situ, MD   100 mg at 09/07/18 1213  . hydrOXYzine (ATARAX/VISTARIL) tablet 25 mg  25 mg Oral Q6H PRN Cobos, Rockey Situ, MD      . loperamide (IMODIUM) capsule 2-4 mg  2-4 mg Oral PRN Cobos, Rockey Situ, MD      . LORazepam (ATIVAN) tablet 1 mg  1 mg Oral Q6H PRN Cobos, Rockey Situ, MD      . magnesium hydroxide (MILK OF MAGNESIA) suspension 30 mL  30 mL Oral Daily PRN Nira Conn A, NP      . methocarbamol (ROBAXIN) tablet 500 mg  500 mg Oral Q8H PRN Cobos, Rockey Situ, MD      . multivitamin with minerals tablet 1 tablet  1 tablet Oral Daily Cobos, Rockey Situ, MD   1 tablet at 09/07/18 1610  .  naproxen (NAPROSYN) tablet 500 mg  500 mg Oral BID PRN Cobos, Rockey Situ, MD      . nicotine (NICODERM CQ - dosed in mg/24 hours) patch 21 mg  21 mg Transdermal Daily Cobos, Rockey Situ, MD   21 mg at 09/07/18 0814  . ondansetron (ZOFRAN-ODT) disintegrating tablet 4 mg  4  mg Oral Q6H PRN Cobos, Rockey Situ, MD      . pantoprazole (PROTONIX) EC tablet 40 mg  40 mg Oral Daily Nira Conn A, NP   40 mg at 09/07/18 8657  . sertraline (ZOLOFT) tablet 25 mg  25 mg Oral Daily Cobos, Rockey Situ, MD   25 mg at 09/07/18 8469  . thiamine (VITAMIN B-1) tablet 100 mg  100 mg Oral Daily Cobos, Rockey Situ, MD   100 mg at 09/07/18 6295  . traZODone (DESYREL) tablet 50 mg  50 mg Oral QHS PRN Cobos, Rockey Situ, MD   50 mg at 09/06/18 2114    Lab Results: No results found for this or any previous visit (from the past 48 hour(s)).  Blood Alcohol level:  Lab Results  Component Value Date   ETH <10 09/03/2018   ETH <10 08/19/2018    Metabolic Disorder Labs: No results found for: HGBA1C, MPG No results found for: PROLACTIN Lab Results  Component Value Date   CHOL 139 04/06/2018   TRIG 146 04/06/2018   HDL 37 (L) 04/06/2018   CHOLHDL 3.8 04/06/2018   VLDL 29 04/06/2018   LDLCALC 73 04/06/2018    Physical Findings: AIMS: Facial and Oral Movements Muscles of Facial Expression: None, normal Lips and Perioral Area: None, normal Jaw: None, normal Tongue: None, normal,Extremity Movements Upper (arms, wrists, hands, fingers): None, normal Lower (legs, knees, ankles, toes): None, normal, Trunk Movements Neck, shoulders, hips: None, normal, Overall Severity Severity of abnormal movements (highest score from questions above): None, normal Incapacitation due to abnormal movements: None, normal Patient's awareness of abnormal movements (rate only patient's report): No Awareness, Dental Status Current problems with teeth and/or dentures?: No Does patient usually wear dentures?: No  CIWA:  CIWA-Ar Total:  0 COWS:  COWS Total Score: 4  Musculoskeletal: Strength & Muscle Tone: within normal limits Gait & Station: normal Patient leans: N/A  Psychiatric Specialty Exam: Physical Exam  Nursing note and vitals reviewed.   Review of Systems  Constitutional: Negative for chills and fever.  Respiratory: Negative for cough and shortness of breath.   Cardiovascular: Negative for chest pain.  Gastrointestinal: Negative for abdominal pain, heartburn, nausea and vomiting.  Psychiatric/Behavioral: Positive for depression and suicidal ideas. Negative for hallucinations. The patient is not nervous/anxious and does not have insomnia.     Blood pressure 97/63, pulse 95, temperature 98.6 F (37 C), temperature source Oral, resp. rate 16, height 5\' 6"  (1.676 m), weight 83.9 kg, SpO2 100 %.Body mass index is 29.86 kg/m.  General Appearance: Casual and Fairly Groomed  Eye Contact:  Good  Speech:  Clear and Coherent and Normal Rate  Volume:  Normal  Mood:  Euthymic  Affect:  Appropriate, Congruent and Constricted  Thought Process:  Coherent and Goal Directed  Orientation:  Full (Time, Place, and Person)  Thought Content:  Logical  Suicidal Thoughts:  No  Homicidal Thoughts:  No  Memory:  Immediate;   Fair Recent;   Fair Remote;   Fair  Judgement:  Poor  Insight:  Lacking  Psychomotor Activity:  Normal  Concentration:  Concentration: Fair  Recall:  [employer] of Knowledge:  Fair  Language:  Fair  Akathisia:  No  Handed:    AIMS (if indicated):     Assets:  Resilience Social Support  ADL's:  Intact  Cognition:  WNL  Sleep:  Number of Hours: 6.5   Treatment Plan Summary: Daily contact with patient to assess and evaluate symptoms and progress in  treatment and Medication management   -Continue inpatient hospitalization  -MDD, recurrent, severe, without psychosis   -Continue zoloft 25mg  po qDay  -Opioid dependence   -Continue COWS with clonidine  -alcohol/benzodiazepine withdrawal    -Continue CIWA with ativan 1mg  po q6h prn CIWA >10  -anxiety   -Continue gabapentin 100mg  po TID   -Continue vistaril 25mg  po q6h prn anxiety  -insomnia   -Continue trazodone 50mg  po qhs prn insomnia  -GERD   -Continue potonix 40mg  po qDay  -Encourage participation in groups and therapeutic milieu  -disposition planning will be ongoing  Micheal Likens, MD 09/07/2018, 12:58 PM

## 2018-09-07 NOTE — [facility] Counselor (Signed)
CSW attempted to complete PSA with the patient, however the patient presented to be extremely lethargic and disoriented upon approach. CSW asked the patient if he would like to complete PSA at a later time, however the patient did not respond.   CSW will attempt PSA at a later time    Baldo DaubJolan Keilyn Nadal, MSW, [facility] Clinical Social Worker [facility]  Phone: [phone]

## 2018-09-07 NOTE — Progress Notes (Signed)
D: Patient observed resting in bed much of the evening. Patient frequently sleepy and observed nodding off in group but also when standing in hallway. Patient states, "yeah, I sleep a lot. You all get me up at 6am. That's too early for me."  Patient's affect flat, mood ambivalent. Denies pain. CIWA scored at a "6", COWS at a "4". Patient noticeably sweaty, disheveled, with unsteady gait.   A: Medicated per orders, no prns requested or needed. Medication education provided. Level III obs in place for safety. Emotional support offered. Patient encouraged to complete Suicide Safety Plan before discharge. Encouraged to attend and participate in unit programming.  Fall prevention plan in place and reviewed with patient as pt is a high fall risk.   R: Patient verbalizes understanding of POC, falls prevention education.  Patient denies SI/HI/AVH and remains safe on level III obs. Will continue to monitor throughout the night.

## 2018-09-07 NOTE — Progress Notes (Signed)
D: Patient observed up and visible in the milieu, playing cards with peers. Visibly annoyed when this writer asked to speak with him. Minimal information forwarded. Patient stated, "I don't need anything right now" and began to walk away. Patient's affect blunted , mood labile. Denies pain, physical complaints, no withdrawal. CIWA is a "2" for anxiety and COWS is a "3" due to slight elevation in pulse and agitation.    A: Medicated per orders, prn trazadone given for sleep. Medication education provided. Level III obs in place for safety. Emotional support offered. Patient encouraged to complete Suicide Safety Plan before discharge. Encouraged to attend and participate in unit programming.  Fall prevention plan in place and reviewed with patient as pt is a high fall risk.   R: Patient verbalizes understanding of POC, falls prevention education. On reassess, patient is asleep. Patient denies SI/HI/AVH and remains safe on level III obs. Will continue to monitor throughout the night.

## 2018-09-07 NOTE — Tx Team (Signed)
Interdisciplinary Treatment and Diagnostic Plan Update  09/07/2018 Time of Session:  Michael [name] MRN: [id number]  Principal Diagnosis: <principal problem not specified>  Secondary Diagnoses: Active Problems:   Severe recurrent major depression without psychotic features (HCC)   Opioid dependence with opioid-induced mood disorder (HCC)   Current Medications:  Current Facility-Administered Medications  Medication Dose Route Frequency Provider Last Rate Last Dose  . acetaminophen (TYLENOL) tablet 650 mg  650 mg Oral Q6H PRN Rozetta Nunnery, NP      . alum & mag hydroxide-simeth (MAALOX/MYLANTA) 200-200-20 MG/5ML suspension 30 mL  30 mL Oral Q4H PRN Lindon Romp A, NP      . cloNIDine (CATAPRES) tablet 0.1 mg  0.1 mg Oral QID Cobos, Myer Peer, MD   0.1 mg at 09/07/18 8185   Followed by  . [START ON 09/08/2018] cloNIDine (CATAPRES) tablet 0.1 mg  0.1 mg Oral BH-qamhs Cobos, Myer Peer, MD       Followed by  . [START ON 09/11/2018] cloNIDine (CATAPRES) tablet 0.1 mg  0.1 mg Oral QAC breakfast Cobos, Myer Peer, MD      . dicyclomine (BENTYL) tablet 20 mg  20 mg Oral Q6H PRN Cobos, Myer Peer, MD      . gabapentin (NEURONTIN) capsule 100 mg  100 mg Oral TID Cobos, Myer Peer, MD   100 mg at 09/07/18 6314  . hydrOXYzine (ATARAX/VISTARIL) tablet 25 mg  25 mg Oral Q6H PRN Cobos, Myer Peer, MD      . loperamide (IMODIUM) capsule 2-4 mg  2-4 mg Oral PRN Cobos, Myer Peer, MD      . LORazepam (ATIVAN) tablet 1 mg  1 mg Oral Q6H PRN Cobos, Myer Peer, MD      . magnesium hydroxide (MILK OF MAGNESIA) suspension 30 mL  30 mL Oral Daily PRN Lindon Romp A, NP      . methocarbamol (ROBAXIN) tablet 500 mg  500 mg Oral Q8H PRN Cobos, Myer Peer, MD      . multivitamin with minerals tablet 1 tablet  1 tablet Oral Daily Cobos, Myer Peer, MD   1 tablet at 09/07/18 9702  . naproxen (NAPROSYN) tablet 500 mg  500 mg Oral BID PRN Cobos, Myer Peer, MD      . nicotine (NICODERM CQ - dosed in  mg/24 hours) patch 21 mg  21 mg Transdermal Daily Cobos, Myer Peer, MD   21 mg at 09/07/18 0814  . ondansetron (ZOFRAN-ODT) disintegrating tablet 4 mg  4 mg Oral Q6H PRN Cobos, Myer Peer, MD      . pantoprazole (PROTONIX) EC tablet 40 mg  40 mg Oral Daily Lindon Romp A, NP   40 mg at 09/07/18 6378  . sertraline (ZOLOFT) tablet 25 mg  25 mg Oral Daily Cobos, Myer Peer, MD   25 mg at 09/07/18 5885  . thiamine (VITAMIN B-1) tablet 100 mg  100 mg Oral Daily Cobos, Myer Peer, MD   100 mg at 09/07/18 0277  . traZODone (DESYREL) tablet 50 mg  50 mg Oral QHS PRN Cobos, Myer Peer, MD   50 mg at 09/06/18 2114   PTA Medications: Medications Prior to Admission  Medication Sig Dispense Refill Last Dose  . acetaminophen (TYLENOL) 325 MG tablet Take 2 tablets (650 mg total) by mouth every 6 (six) hours as needed for moderate pain, fever or headache.     . Buprenorphine HCl-Naloxone HCl 8-2 MG FILM Place 1 Film under the tongue 2 (two) times daily.     Marland Kitchen  cloNIDine (CATAPRES) 0.1 MG tablet Take 0.1 mg by mouth at bedtime.   0 Past Week at Unknown time  . nicotine (NICODERM CQ - DOSED IN MG/24 HOURS) 21 mg/24hr patch Place 1 patch (21 mg total) onto the skin daily.     . pantoprazole (PROTONIX) 40 MG tablet Take 1 tablet (40 mg total) by mouth daily.       Patient Stressors: Financial difficulties Substance abuse  Patient Strengths: Motivation for treatment/growth Physical Health  Treatment Modalities: Medication Management, Group therapy, Case management,  1 to 1 session with clinician, Psychoeducation, Recreational therapy.   Physician Treatment Plan for Primary Diagnosis: <principal problem not specified> Long Term Goal(s): Improvement in symptoms so as ready for discharge Improvement in symptoms so as ready for discharge   Short Term Goals: Ability to identify triggers associated with substance abuse/mental health issues will improve Ability to identify changes in lifestyle to reduce  recurrence of condition will improve Ability to verbalize feelings will improve Ability to disclose and discuss suicidal ideas Ability to demonstrate self-control will improve Ability to identify and develop effective coping behaviors will improve Ability to maintain clinical measurements within normal limits will improve  Medication Management: Evaluate patient's response, side effects, and tolerance of medication regimen.  Therapeutic Interventions: 1 to 1 sessions, Unit Group sessions and Medication administration.  Evaluation of Outcomes: Not Met  Physician Treatment Plan for Secondary Diagnosis: Active Problems:   Severe recurrent major depression without psychotic features ([place])   Opioid dependence with opioid-induced mood disorder ([place])  Long Term Goal(s): Improvement in symptoms so as ready for discharge Improvement in symptoms so as ready for discharge   Short Term Goals: Ability to identify triggers associated with substance abuse/mental health issues will improve Ability to identify changes in lifestyle to reduce recurrence of condition will improve Ability to verbalize feelings will improve Ability to disclose and discuss suicidal ideas Ability to demonstrate self-control will improve Ability to identify and develop effective coping behaviors will improve Ability to maintain clinical measurements within normal limits will improve     Medication Management: Evaluate patient's response, side effects, and tolerance of medication regimen.  Therapeutic Interventions: 1 to 1 sessions, Unit Group sessions and Medication administration.  Evaluation of Outcomes: Not Met   RN Treatment Plan for Primary Diagnosis: <principal problem not specified> Long Term Goal(s): Knowledge of disease and therapeutic regimen to maintain health will improve  Short Term Goals: Ability to participate in decision making will improve, Ability to verbalize feelings will improve, Ability to identify  and develop effective coping behaviors will improve and Compliance with prescribed medications will improve  Medication Management: RN will administer medications as ordered by provider, will assess and evaluate patient's response and provide education to patient for prescribed medication. RN will report any adverse and/or side effects to prescribing provider.  Therapeutic Interventions: 1 on 1 counseling sessions, Psychoeducation, Medication administration, Evaluate responses to treatment, Monitor vital signs and CBGs as ordered, Perform/monitor CIWA, COWS, AIMS and Fall Risk screenings as ordered, Perform wound care treatments as ordered.  Evaluation of Outcomes: Not Met   LCSW Treatment Plan for Primary Diagnosis: <principal problem not specified> Long Term Goal(s): Safe transition to appropriate next level of care at discharge, Engage patient in therapeutic group addressing interpersonal concerns.  Short Term Goals: Engage patient in aftercare planning with referrals and resources  Therapeutic Interventions: Assess for all discharge needs, 1 to 1 time with Social worker, Explore available resources and support systems, Assess for adequacy in community  support network, Educate family and significant other(s) on suicide prevention, Complete Psychosocial Assessment, Interpersonal group therapy.  Evaluation of Outcomes: Not Met   Progress in Treatment: Attending groups: No. New to unit  Participating in groups: No. Taking medication as prescribed: Yes. Toleration medication: Yes. Family/Significant other contact made: No, will contact:  if patient consents to collateral contacts Patient understands diagnosis: Yes. Discussing patient identified problems/goals with staff: Yes. Medical problems stabilized or resolved: Yes. Denies suicidal/homicidal ideation: Yes. Issues/concerns per patient self-inventory: No. Other:   New problem(s) identified: None   New Short Term/Long Term  Goal(s):Detox, medication stabilization, elimination of SI thoughts, development of comprehensive mental wellness plan.   Patient Goals:  Get off the suboxone and get on a medicine that will help with anxiety and with sleep  Discharge Plan or Barriers: CSW will assess for appropriate referrals and possible discharge planning.   Reason for Continuation of Hospitalization: Anxiety Depression Medication stabilization Suicidal ideation  Estimated Length of Stay: 3-5 days   Attendees: Patient: 09/07/2018 12:11 PM  Physician: Dr. Maris Berger, MD 09/07/2018 12:11 PM  Nursing: Elberta Fortis.A, RN 09/07/2018 12:11 PM  RN Care Manager: Lars Pinks, RN 09/07/2018 12:11 PM  Social Worker: Radonna Ricker, [place] 09/07/2018 12:11 PM  Recreational Therapist:  09/07/2018 12:11 PM  Other:  09/07/2018 12:11 PM  Other:  09/07/2018 12:11 PM  Other: 09/07/2018 12:11 PM    Scribe for Treatment Team: Marylee Floras, [place] 09/07/2018 12:11 PM

## 2018-09-07 NOTE — Plan of Care (Signed)
Problem: Activity: Goal: Interest or engagement in activities will improve Outcome: Progressing   Problem: Coping: Goal: Ability to verbalize frustrations and anger appropriately will improve Outcome: Progressing   D: Pt alert and oriented on the unit. Pt engaging with RN staff and other pts. Pt denies SI/HI, A/VH. Pt's goal for today is "being more positive." Pt is pleasant and cooperative. A: Education, support and encouragement provided, q15 minute safety checks remain in effect. Medications administered per MD orders. R: No reactions/side effects to medicine noted. Pt denies any concerns at this time, and verbally contracts for safety. Pt ambulating on the unit with no issues. Pt remains safe on and off the unit.
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
## 2018-09-08 NOTE — Plan of Care (Signed)
  Problem: Activity: Goal: Interest or engagement in activities will improve Outcome: Progressing Goal: Sleeping patterns will improve Outcome: Progressing   D: Pt alert and oriented on the unit. Pt engaging with RN staff and other pts. Pt denies SI/HI, A/VH. Pt rated his depression a 2, hopelessness a 0, and anxiety a 0 for today, all on a scale of 0 to 10. Pt is pleasant and cooperative. A: Education, support and encouragement provided, q15 minute safety checks remain in effect. Medications administered per MD orders. R: No reactions/side effects to medicine noted. Pt denies any concerns at this time, and verbally contracts for safety. Pt ambulating on the unit with no issues. Pt remains safe on and off the unit.

## 2018-09-08 NOTE — [facility] Group Notes (Signed)
[facility] Group Notes:  (Nursing/MHT/Case Management/Adjunct)  Date:  09/08/2018  Time:  4:00 pm  Type of Therapy:  Psychoeducational Skills  Participation Level:  Active  Participation Quality:  Appropriate  Affect:  Appropriate  Cognitive:  Appropriate  Insight:  Appropriate  Engagement in Group:  Engaged  Modes of Intervention:  Discussion and Education  Summary of Progress/Problems:  Patient was alert and active during group.  Earline MayotteKnight, Sameul Tagle Shephard 09/08/2018, 5:30 PM

## 2018-09-08 NOTE — Progress Notes (Signed)
[facility] MD Progress Note  09/08/2018 12:26 PM Michael [name]  MRN:  [id number]  Subjective:  Michael [name] reports, "I'm sweating like crazy today, not because of the heroin withdrawal but because I'm running around exercising.  I came here because of depression, sadness & heroin use. I'm doing okay today right now, just a little depressed. My anxiety is not too bad today. I am awake today. I'm happy about that because I slept all day when I first got to this hospital. I'm tolerating the medicines. No side effects".  History as per psychiatric intake: 37 year old male, presented to ED with family, due to mental status changes, somnolence.  Required Geodon for agitation. Patient reports he feels this was due to " being tired that day". He endorses history of opiate ( heroin) , cocaine ( crack)  use disorder . Endorses using Xanax at times, but denies any recent BZD abuse . Denies IVDA. Of note, admission UDS was positive for BZD. BAL was negative. He states he is unsure exactly  when he last used above substances. States " I guess it was about 5 days ago). Patient endorses history of depression and states, " I feel I have been depressed most of my life ". Denies suicidal plans or intentions , but does endorse some passive thoughts of death, dying and states " I don't think anybody is going to miss me". Endorses neuro-vegetative symptoms as below.   As per evaluation: Today upon evaluation, pt shares about his reasons for admission, stating, it was because of depression, sadness & heroin use. Pt confirms that he has been abusing multiple drugs, heroin, crack cocaine, methamphetamine, valium and xanax illicitly. He reports that overall he is feeling better today as he is no longer sleeping too much as he was when first admitted.  He denies any other specific concerns. He is sleeping well. His appetite is good. He denies other physical complaints. He denies SI/HI/AH/VH. He is tolerating his medications  well, and he is in agreement to continue his current regimen without changes. He will be continued on CIWA and COWS. He is in agreement for a referral to residential substance use treatment or a good place to reside after discharge. Michael [name] will work with the SW team regarding these referrals & other substance abuse placements.  Principal Problem: Severe recurrent major depression without psychotic features (HCC) Diagnosis: Principal Problem:   Severe recurrent major depression without psychotic features (HCC) Active Problems:   Opioid dependence with opioid-induced mood disorder (HCC)  Total Time spent with patient: 15 minutes  Past Psychiatric History: See H&P  Past Medical History:  Past Medical History:  Diagnosis Date  . Polysubstance abuse (HCC)   . Sciatica   . Shoulder dislocation     Past Surgical History:  Procedure Laterality Date  . ELBOW FRACTURE SURGERY     Family History: History reviewed. No pertinent family history.  Family Psychiatric  History: See H&P  Social History:  Social History   Substance and Sexual Activity  Alcohol Use Yes   Comment: "rarely"     Social History   Substance and Sexual Activity  Drug Use Yes  . Types: IV, Benzodiazepines   Comment: Heroin, Crack yesterday    Social History   Socioeconomic History  . Marital status: Single    Spouse name: Not on file  . Number of children: Not on file  . Years of education: Not on file  . Highest education level: Not on file  Occupational  History  . Not on file  Social Needs  . Financial resource strain: Not on file  . Food insecurity:    Worry: Not on file    Inability: Not on file  . Transportation needs:    Medical: Not on file    Non-medical: Not on file  Tobacco Use  . Smoking status: Current Every Day Smoker    Packs/day: 1.00    Types: Cigarettes  . Smokeless tobacco: Never Used  . Tobacco comment: Patient refused smoking cessation education  Substance and Sexual Activity   . Alcohol use: Yes    Comment: "rarely"  . Drug use: Yes    Types: IV, Benzodiazepines    Comment: Heroin, Crack yesterday  . Sexual activity: Yes    Birth control/protection: None  Lifestyle  . Physical activity:    Days per week: Not on file    Minutes per session: Not on file  . Stress: Not on file  Relationships  . Social connections:    Talks on phone: Not on file    Gets together: Not on file    Attends religious service: Not on file    Active member of club or organization: Not on file    Attends meetings of clubs or organizations: Not on file    Relationship status: Not on file  Other Topics Concern  . Not on file  Social History Narrative  . Not on file   Additional Social History:   Sleep: Good  Appetite:  Good  Current Medications: Current Facility-Administered Medications  Medication Dose Route Frequency Provider Last Rate Last Dose  . acetaminophen (TYLENOL) tablet 650 mg  650 mg Oral Q6H PRN Nira Conn A, Michael [name]      . alum & mag hydroxide-simeth (MAALOX/MYLANTA) 200-200-20 MG/5ML suspension 30 mL  30 mL Oral Q4H PRN Nira Conn A, Michael [name]      . cloNIDine (CATAPRES) tablet 0.1 mg  0.1 mg Oral BH-qamhs Cobos, Rockey Situ, MD       Followed by  . [START ON 09/11/2018] cloNIDine (CATAPRES) tablet 0.1 mg  0.1 mg Oral QAC breakfast Cobos, Rockey Situ, MD      . dicyclomine (BENTYL) tablet 20 mg  20 mg Oral Q6H PRN Cobos, Fernando A, MD      . gabapentin (NEURONTIN) capsule 100 mg  100 mg Oral TID Cobos, Rockey Situ, MD   100 mg at 09/08/18 1211  . guaiFENesin-dextromethorphan (ROBITUSSIN DM) 100-10 MG/5ML syrup 5 mL  5 mL Oral Q4H PRN Micheal Likens, MD   5 mL at 09/07/18 1721  . hydrOXYzine (ATARAX/VISTARIL) tablet 25 mg  25 mg Oral Q6H PRN Cobos, Rockey Situ, MD      . loperamide (IMODIUM) capsule 2-4 mg  2-4 mg Oral PRN Cobos, Rockey Situ, MD      . LORazepam (ATIVAN) tablet 1 mg  1 mg Oral Q6H PRN Cobos, Rockey Situ, MD      . magnesium hydroxide (MILK OF  MAGNESIA) suspension 30 mL  30 mL Oral Daily PRN Nira Conn A, Michael [name]      . methocarbamol (ROBAXIN) tablet 500 mg  500 mg Oral Q8H PRN Cobos, Rockey Situ, MD      . multivitamin with minerals tablet 1 tablet  1 tablet Oral Daily Cobos, Rockey Situ, MD   1 tablet at 09/08/18 0819  . naproxen (NAPROSYN) tablet 500 mg  500 mg Oral BID PRN Cobos, Rockey Situ, MD   500 mg at 09/07/18 1645  . nicotine (  NICODERM CQ - dosed in mg/24 hours) patch 21 mg  21 mg Transdermal Daily Cobos, Rockey Situ, MD   21 mg at 09/08/18 0823  . ondansetron (ZOFRAN-ODT) disintegrating tablet 4 mg  4 mg Oral Q6H PRN Cobos, Rockey Situ, MD      . pantoprazole (PROTONIX) EC tablet 40 mg  40 mg Oral Daily Nira Conn A, Michael [name]   40 mg at 09/08/18 0821  . sertraline (ZOLOFT) tablet 25 mg  25 mg Oral Daily Cobos, Rockey Situ, MD   25 mg at 09/08/18 0819  . thiamine (VITAMIN B-1) tablet 100 mg  100 mg Oral Daily Cobos, Rockey Situ, MD   100 mg at 09/08/18 0819  . traZODone (DESYREL) tablet 50 mg  50 mg Oral QHS PRN Cobos, Rockey Situ, MD   50 mg at 09/06/18 2114   Lab Results: No results found for this or any previous visit (from the past 48 hour(s)).  Blood Alcohol level:  Lab Results  Component Value Date   ETH <10 09/03/2018   ETH <10 08/19/2018   Metabolic Disorder Labs: No results found for: HGBA1C, MPG No results found for: PROLACTIN Lab Results  Component Value Date   CHOL 139 04/06/2018   TRIG 146 04/06/2018   HDL 37 (L) 04/06/2018   CHOLHDL 3.8 04/06/2018   VLDL 29 04/06/2018   LDLCALC 73 04/06/2018   Physical Findings: AIMS: Facial and Oral Movements Muscles of Facial Expression: None, normal Lips and Perioral Area: None, normal Jaw: None, normal Tongue: None, normal,Extremity Movements Upper (arms, wrists, hands, fingers): None, normal Lower (legs, knees, ankles, toes): None, normal, Trunk Movements Neck, shoulders, hips: None, normal, Overall Severity Severity of abnormal movements (highest score from questions  above): None, normal Incapacitation due to abnormal movements: None, normal Patient's awareness of abnormal movements (rate only patient's report): No Awareness, Dental Status Current problems with teeth and/or dentures?: No Does patient usually wear dentures?: No  CIWA:  CIWA-Ar Total: 2 COWS:  COWS Total Score: 0  Musculoskeletal: Strength & Muscle Tone: within normal limits Gait & Station: normal Patient leans: N/A  Psychiatric Specialty Exam: Physical Exam  Nursing note and vitals reviewed.   Review of Systems  Constitutional: Negative for chills and fever.  Respiratory: Negative for cough and shortness of breath.   Cardiovascular: Negative for chest pain.  Gastrointestinal: Negative for abdominal pain, heartburn, nausea and vomiting.  Psychiatric/Behavioral: Positive for depression and suicidal ideas. Negative for hallucinations. The patient is not nervous/anxious and does not have insomnia.     Blood pressure 101/71, pulse (!) 111, temperature 98.3 F (36.8 C), temperature source Oral, resp. rate 16, height 5\' 6"  (1.676 m), weight 83.9 kg, SpO2 100 %.Body mass index is 29.86 kg/m.  General Appearance: Casual and Fairly Groomed  Eye Contact:  Good  Speech:  Clear and Coherent and Normal Rate  Volume:  Normal  Mood:  Euthymic  Affect:  Appropriate, Congruent and Full Range  Thought Process:  Coherent and Goal Directed  Orientation:  Full (Time, Place, and Person)  Thought Content:  Logical  Suicidal Thoughts:  No  Homicidal Thoughts:  No  Memory:  Immediate;   Fair Recent;   Fair Remote;   Fair  Judgement:  Fair  Insight:  Lacking  Psychomotor Activity:  Normal  Concentration:  Concentration: Fair  Recall:  [employer] of Knowledge:  Fair  Language:  Fair  Akathisia:  No  Handed:    AIMS (if indicated):     Assets:  Resilience Social Support  ADL's:  Intact  Cognition:  WNL  Sleep:  Number of Hours: 6.75   Treatment Plan Summary: Daily contact with patient  to assess and evaluate symptoms and progress in treatment and Medication management   -Continue inpatient hospitalization.  -Will continue today 09/08/2018 plan as below except where it is noted.  -MDD, recurrent, severe, without psychosis   -Continue zoloft 25mg  po qDay  -Opioid dependence   -Continue COWS with clonidine  -alcohol/benzodiazepine withdrawal   -Continue CIWA with ativan 1mg  po q6h prn CIWA >10  -anxiety   -Continue gabapentin 100mg  po TID   -Continue vistaril 25mg  po q6h prn anxiety  -insomnia   -Continue trazodone 50mg  po qhs prn insomnia  -GERD   -Continue potonix 40mg  po qDay  -Encourage participation in groups and therapeutic milieu  -disposition planning will be ongoing  Michael StammerAgnes Nwoko, Michael [name], Michael [name], Michael [name] 09/08/2018, 12:26 PM  Patient ID: Michael [name], male   DOB: 10/28/1980, 37 y.o.   MRN: [id number]

## 2018-09-09 MED ORDER — IBUPROFEN 600 MG PO TABS
600.0000 mg | ORAL_TABLET | Freq: Four times a day (QID) | ORAL | Status: DC | PRN
  Administered 2018-09-10: 600 mg via ORAL
  Filled 2018-09-09: qty 1

## 2018-09-09 MED ORDER — SERTRALINE HCL 50 MG PO TABS
50.0000 mg | ORAL_TABLET | Freq: Every day | ORAL | Status: DC
  Administered 2018-09-10: 50 mg via ORAL
  Filled 2018-09-09: qty 1
  Filled 2018-09-09: qty 21
  Filled 2018-09-09 (×2): qty 1

## 2018-09-09 NOTE — Therapy (Signed)
Occupational Therapy Group Note  Date:  09/09/2018 Time:  11:38 AM  Group Topic/Focus:  Stress Management  Participation Level:  Active  Participation Quality:  Monopolizing and Resistant  Affect:  Blunted  Cognitive:  Appropriate  Insight: Lacking  Engagement in Group:  Monopolizing and Off Topic  Modes of Intervention:  Activity, Discussion, Education and Socialization  Additional Comments:    S: "I got stressed by my romantic relationship"  O: Education given on stress management in reference to negative vs positive coping mechanisms. Various styles of coping strategies explored to help pt choose best to personally meet needs. Discussion facilitated and encouraged amongst group for sharing of personal experiences.  A: Pt presents to group with blunted affect, monopolizing and dominant throughout group with tangents and random topics. Pt very difficult to redirect, but eventually stating that deep breathing is helpful him at this time to manage stress.  P: OT will continue to follow up while pt acute.  Dalphine HandingKaylee Ceciley Buist, MSOT, OTR/L Behavioral Health OT/ Acute Relief OT PHP Office: [phone]  Dalphine HandingKaylee Saumya Hukill 09/09/2018, 11:38 AM

## 2018-09-09 NOTE — Progress Notes (Signed)
Recreation Therapy Notes  Date: 11.20.19 Time: 0930 Location: [address] Dayroom  Group Topic: Stress Management  Goal Area(s) Addresses:  Patient will verbalize importance of using healthy stress management.  Patient will identify positive emotions associated with healthy stress management.   Intervention: Stress Management  Activity :  Guided Imagery.  LRT introduced the stress management technique of guided imagery.  LRT read a script to guide patients through a peaceful meadow.  Patients were to follow along as script was read to engage in activity.  Education:  Stress Management, Discharge Planning.   Education Outcome: Acknowledges edcuation/In group clarification offered/Needs additional education  Clinical Observations/Feedback: Pt did not attend group.    Caroll RancherMarjette Tedi Hughson, LRT/CTRS         Caroll RancherLindsay, Orpheus Hayhurst A 09/09/2018 11:18 AM

## 2018-09-09 NOTE — Progress Notes (Signed)
Pt reports his day was ok.  He was playing cards at the beginning of the shift with other patients, but then attended evening NA group.  He had to come out of the group early d/t a nagging cough for which he received Guaifenesin DM.  A little while latter, pt came to the NS stating that his chest felt tight and that his oxygen was low.  His O2 was at 99% when checked, and pt was informed that his oxygen was fine.  Pt then relaxed and did not complain any more about tightness in his chest.  Pt has been in the dayroom most of the evening.  He makes his needs known to staff.  He denies SI/HI/AVH.  Support and encouragement offered.  Discharge plans are in process.  Safety maintained with q15 minute checks.

## 2018-09-09 NOTE — Plan of Care (Signed)
D: Patient presents depressed, cooperative. He complains of right knee pain 8/10 and right shoulder pain 8/10. He reports sleeping well last night, and received medication that was helpful. His appetite is good, energy normal and concentration good. He rates his depression and sense of hopelessness 7/10. His anxiety is 8/10. He complains of cravings, agitation, runny nose, chills, nausea, and irritability. Patient denies SI/HI/AVH.  A: Administered ibuprofen for pain. Patient checked q15 min, and checks reviewed. Reviewed medication changes with patient and educated on side effects. Educated patient on importance of attending group therapy sessions and educated on several coping skills. Encouarged participation in milieu through recreation therapy and attending meals with peers. Support and encouragement provided. Fluids offered. R: Patient receptive to education on medications, and is medication compliant. Patient contracts for safety on the unit. His goal for today is "be positive all day" and "think happy thoughts." Patient requests more pain medication.

## 2018-09-09 NOTE — Progress Notes (Signed)
Patient did attend the evening speaker NA meeting. Pt did come out of group before it ended because of an irritating cough. Pt did receive medication from the nurse for his cough.

## 2018-09-09 NOTE — Progress Notes (Signed)
[facility] MD Progress Note  09/09/2018 10:53 AM Michael [name]  MRN:  [id number] Subjective:    History as per psychiatric intake: 37 year old male, presented to ED with family, due to mental status changes, somnolence. Required Geodon for agitation. Patient reports he feels this was due to " being tired that day". He endorses history of opiate ( heroin) , cocaine ( crack) use disorder . Endorses using Xanax at times, but denies any recent BZD abuse . Denies IVDA.Of note, admission UDS was positive for BZD. BAL was negative. He states he is unsure exactly when he last used above substances. States " I guess it was about 5 days ago). Patient endorses history of depression and states, " I feel I have been depressed most of my life ". Denies suicidal plans or intentions , but does endorse some passive thoughts of death, dying and states " I don't think anybody is going to miss me". Endorses neuro-vegetative symptoms as below.  As per evaluation: Today upon evaluation, pt shares, "I'm pretty good." He denies any specific concerns. He notes that he is feeling more alert and clear than compared to first arriving on the unit. He is sleeping well. His appetite is good. He denies other physical complaints. He continues to have some mild depression and anxiety, but he denies SI/HI/AH/VH. He is tolerating his medications well. We discussed increasing his dose of zoloft as he is tolerating it well, and pt was in agreement. He is currently denying symptoms of alcohol withdrawal, so we will discontinue CIWA protocol. He remains in agreement for plan to be referred to residential substance use treatment, such as at [facility] and he will continue to work with SW team regarding this referral. Pt was in agreement with the above plan, and he had no further questions, comments, or concerns.  Principal Problem: Severe recurrent major depression without psychotic features (HCC) Diagnosis: Principal Problem:   Severe  recurrent major depression without psychotic features (HCC) Active Problems:   Opioid dependence with opioid-induced mood disorder (HCC)  Total Time spent with patient: 30 minutes  Past Psychiatric History: see H&P  Past Medical History:  Past Medical History:  Diagnosis Date  . Polysubstance abuse (HCC)   . Sciatica   . Shoulder dislocation     Past Surgical History:  Procedure Laterality Date  . ELBOW FRACTURE SURGERY     Family History: History reviewed. No pertinent family history. Family Psychiatric  History: see H&P Social History:  Social History   Substance and Sexual Activity  Alcohol Use Yes   Comment: "rarely"     Social History   Substance and Sexual Activity  Drug Use Yes  . Types: IV, Benzodiazepines   Comment: Heroin, Crack yesterday    Social History   Socioeconomic History  . Marital status: Single    Spouse name: Not on file  . Number of children: Not on file  . Years of education: Not on file  . Highest education level: Not on file  Occupational History  . Not on file  Social Needs  . Financial resource strain: Not on file  . Food insecurity:    Worry: Not on file    Inability: Not on file  . Transportation needs:    Medical: Not on file    Non-medical: Not on file  Tobacco Use  . Smoking status: Current Every Day Smoker    Packs/day: 1.00    Types: Cigarettes  . Smokeless tobacco: Never Used  . Tobacco  comment: Patient refused smoking cessation education  Substance and Sexual Activity  . Alcohol use: Yes    Comment: "rarely"  . Drug use: Yes    Types: IV, Benzodiazepines    Comment: Heroin, Crack yesterday  . Sexual activity: Yes    Birth control/protection: None  Lifestyle  . Physical activity:    Days per week: Not on file    Minutes per session: Not on file  . Stress: Not on file  Relationships  . Social connections:    Talks on phone: Not on file    Gets together: Not on file    Attends religious service: Not on file     Active member of club or organization: Not on file    Attends meetings of clubs or organizations: Not on file    Relationship status: Not on file  Other Topics Concern  . Not on file  Social History Narrative  . Not on file   Additional Social History:                         Sleep: Good  Appetite:  Good  Current Medications: Current Facility-Administered Medications  Medication Dose Route Frequency Provider Last Rate Last Dose  . acetaminophen (TYLENOL) tablet 650 mg  650 mg Oral Q6H PRN Nira ConnBerry, Jason A, NP   650 mg at 09/09/18 0808  . alum & mag hydroxide-simeth (MAALOX/MYLANTA) 200-200-20 MG/5ML suspension 30 mL  30 mL Oral Q4H PRN Nira ConnBerry, Jason A, NP      . cloNIDine (CATAPRES) tablet 0.1 mg  0.1 mg Oral BH-qamhs Cobos, Rockey SituFernando A, MD   0.1 mg at 09/09/18 0803   Followed by  . [START ON 09/11/2018] cloNIDine (CATAPRES) tablet 0.1 mg  0.1 mg Oral QAC breakfast Cobos, Rockey SituFernando A, MD      . dicyclomine (BENTYL) tablet 20 mg  20 mg Oral Q6H PRN Cobos, Fernando A, MD      . gabapentin (NEURONTIN) capsule 100 mg  100 mg Oral TID Cobos, Rockey SituFernando A, MD   100 mg at 09/09/18 0802  . guaiFENesin-dextromethorphan (ROBITUSSIN DM) 100-10 MG/5ML syrup 5 mL  5 mL Oral Q4H PRN Micheal Likensainville, Arneta Mahmood T, MD   5 mL at 09/09/18 0846  . hydrOXYzine (ATARAX/VISTARIL) tablet 25 mg  25 mg Oral Q6H PRN Cobos, Rockey SituFernando A, MD   25 mg at 09/08/18 1825  . loperamide (IMODIUM) capsule 2-4 mg  2-4 mg Oral PRN Cobos, Rockey SituFernando A, MD      . LORazepam (ATIVAN) tablet 1 mg  1 mg Oral Q6H PRN Cobos, Rockey SituFernando A, MD      . magnesium hydroxide (MILK OF MAGNESIA) suspension 30 mL  30 mL Oral Daily PRN Nira ConnBerry, Jason A, NP      . methocarbamol (ROBAXIN) tablet 500 mg  500 mg Oral Q8H PRN Cobos, Rockey SituFernando A, MD   500 mg at 09/08/18 2109  . multivitamin with minerals tablet 1 tablet  1 tablet Oral Daily Cobos, Rockey SituFernando A, MD   1 tablet at 09/09/18 0802  . naproxen (NAPROSYN) tablet 500 mg  500 mg Oral BID PRN Cobos,  Rockey SituFernando A, MD   500 mg at 09/07/18 1645  . nicotine (NICODERM CQ - dosed in mg/24 hours) patch 21 mg  21 mg Transdermal Daily Cobos, Rockey SituFernando A, MD   21 mg at 09/09/18 0803  . ondansetron (ZOFRAN-ODT) disintegrating tablet 4 mg  4 mg Oral Q6H PRN Cobos, Rockey SituFernando A, MD      .  pantoprazole (PROTONIX) EC tablet 40 mg  40 mg Oral Daily Nira Conn A, NP   40 mg at 09/09/18 0802  . [START ON 09/10/2018] sertraline (ZOLOFT) tablet 50 mg  50 mg Oral Daily Jolyne Loa T, MD      . thiamine (VITAMIN B-1) tablet 100 mg  100 mg Oral Daily Cobos, Rockey Situ, MD   100 mg at 09/09/18 0803  . traZODone (DESYREL) tablet 50 mg  50 mg Oral QHS PRN Cobos, Rockey Situ, MD   50 mg at 09/08/18 2109    Lab Results: No results found for this or any previous visit (from the past 48 hour(s)).  Blood Alcohol level:  Lab Results  Component Value Date   ETH <10 09/03/2018   ETH <10 08/19/2018    Metabolic Disorder Labs: No results found for: HGBA1C, MPG No results found for: PROLACTIN Lab Results  Component Value Date   CHOL 139 04/06/2018   TRIG 146 04/06/2018   HDL 37 (L) 04/06/2018   CHOLHDL 3.8 04/06/2018   VLDL 29 04/06/2018   LDLCALC 73 04/06/2018    Physical Findings: AIMS: Facial and Oral Movements Muscles of Facial Expression: None, normal Lips and Perioral Area: None, normal Jaw: None, normal Tongue: None, normal,Extremity Movements Upper (arms, wrists, hands, fingers): None, normal Lower (legs, knees, ankles, toes): None, normal, Trunk Movements Neck, shoulders, hips: None, normal, Overall Severity Severity of abnormal movements (highest score from questions above): None, normal Incapacitation due to abnormal movements: None, normal Patient's awareness of abnormal movements (rate only patient's report): No Awareness, Dental Status Current problems with teeth and/or dentures?: No Does patient usually wear dentures?: No  CIWA:  CIWA-Ar Total: 2 COWS:  COWS Total Score:  1  Musculoskeletal: Strength & Muscle Tone: within normal limits Gait & Station: normal Patient leans: N/A  Psychiatric Specialty Exam: Physical Exam  Nursing note and vitals reviewed.   Review of Systems  Constitutional: Negative for chills and fever.  Respiratory: Negative for cough and shortness of breath.   Cardiovascular: Negative for chest pain.  Gastrointestinal: Negative for abdominal pain, heartburn, nausea and vomiting.  Psychiatric/Behavioral: Positive for depression. Negative for hallucinations and suicidal ideas. The patient is nervous/anxious. The patient does not have insomnia.     Blood pressure 105/65, pulse 89, temperature (!) 97.4 F (36.3 C), temperature source Oral, resp. rate 18, height 5\' 6"  (1.676 m), weight 83.9 kg, SpO2 100 %.Body mass index is 29.86 kg/m.  General Appearance: Casual and Fairly Groomed  Eye Contact:  Good  Speech:  Clear and Coherent and Normal Rate  Volume:  Normal  Mood:  Anxious and Depressed  Affect:  Appropriate and Congruent  Thought Process:  Coherent and Goal Directed  Orientation:  Full (Time, Place, and Person)  Thought Content:  Logical  Suicidal Thoughts:  No  Homicidal Thoughts:  No  Memory:  Immediate;   Fair Recent;   Fair Remote;   Fair  Judgement:  Fair  Insight:  Fair  Psychomotor Activity:  Normal  Concentration:  Concentration: Fair  Recall:  [employer] of Knowledge:  Fair  Language:  Fair  Akathisia:  No  Handed:    AIMS (if indicated):     Assets:  Resilience Social Support  ADL's:  Intact  Cognition:  WNL  Sleep:  Number of Hours: 6.75   Treatment Plan Summary: Daily contact with patient to assess and evaluate symptoms and progress in treatment and Medication management   -Continue inpatient hospitalization.  -MDD, recurrent,  severe, without psychosis             -Change zoloft 25mg  po qDay to zoloft 50mg  po qDay  -Opioid dependence             -Continue COWS with  clonidine  -alcohol/benzodiazepine withdrawal              - Discontinue CIWA   - Discontinue ativan 1mg  po q6h prn CIWA >10  -anxiety             -Continue gabapentin 100mg  po TID             -Continue vistaril 25mg  po q6h prn anxiety  -insomnia             -Continue trazodone 50mg  po qhs prn insomnia  -GERD             -Continue potonix 40mg  po qDay  -Encourage participation in groups and therapeutic milieu  -disposition planning will be ongoing  Micheal Likens, MD 09/09/2018, 10:53 AM

## 2018-09-09 NOTE — [facility] Group Notes (Signed)
[facility] Group Notes:  (Nursing/MHT/Case Management/Adjunct)  Date:  09/09/2018  Time:  7:23 PM  Type of Therapy:  Nurse Education  Participation Level:  Active  Participation Quality:  Appropriate and Attentive  Affect:  Appropriate  Cognitive:  Alert and Appropriate  Insight:  Lacking  Engagement in Group:  Engaged  Modes of Intervention:  Discussion and Education  Summary of Progress/Problems: RN led group on "Needs Assessment." We discussed positive self affirmation, the triangle of "emotions," "actions," and "thoughts", and Maslow's hierarchy of needs. We discussed the importance of meeting needs in a healthy manner.   Kirstie MirzaJonathan C Tabithia Stroder 09/09/2018, 7:23 PM

## 2018-09-10 MED ORDER — SERTRALINE HCL 50 MG PO TABS: 50.0000 mg | ORAL_TABLET | Freq: Every day | ORAL | 0 refills | Status: DC

## 2018-09-10 MED ORDER — GABAPENTIN 100 MG PO CAPS: 100.0000 mg | ORAL_CAPSULE | Freq: Three times a day (TID) | ORAL | 1 refills | Status: DC

## 2018-09-10 MED ORDER — PANTOPRAZOLE SODIUM 40 MG PO TBEC: 40.0000 mg | DELAYED_RELEASE_TABLET | Freq: Every day | ORAL | 0 refills | Status: DC

## 2018-09-10 MED ORDER — TRAZODONE HCL 50 MG PO TABS: 50.0000 mg | ORAL_TABLET | Freq: Every evening | ORAL | 0 refills | Status: DC | PRN

## 2018-09-10 MED ORDER — HYDROXYZINE HCL 25 MG PO TABS: 25.0000 mg | ORAL_TABLET | Freq: Four times a day (QID) | ORAL | 0 refills | Status: DC | PRN

## 2018-09-10 NOTE — Progress Notes (Signed)
[facility]  [facility]  Discharge instructions were reviewed with pt.  Pt's medications were also reviewed.  Pt voiced understanding.  All of the contents of locker #1 were returned to patient.  [facility], pt's medications and paperwork were given to staff member transporting pt to the receiving facility.  Pt reports he is feeling hopeful and ready for the next step of his recovery.  Pt safe at time of discharge.

## 2018-09-10 NOTE — [facility] Suicide Risk Assessment (Signed)
[facility]   Principal Problem: Severe recurrent major depression without psychotic features [facility](HCC) Discharge Diagnoses: Principal Problem:   Severe recurrent major depression without psychotic features (HCC) Active Problems:   Opioid dependence with opioid-induced mood disorder (HCC)   Total Time spent with patient: 30 minutes    Psychiatric Specialty Exam:   Blood pressure 120/78, pulse 90, temperature 98.7 F (37.1 C), temperature source Oral, resp. rate 16, height 5\' 6"  (1.676 m), weight 83.9 kg, SpO2 100 %.Body mass index is 29.86 kg/m.   Mental Status Per Nursing Assessment::   On Admission:  Self-harm behaviors  Demographic Factors:  Male, Adolescent or young adult, Caucasian, Low socioeconomic status and Unemployed  Loss Factors: Financial problems/change in socioeconomic status  Historical Factors: Impulsivity  Risk Reduction Factors:   Sense of responsibility to family, Positive social support, Positive therapeutic relationship and Positive coping skills or problem solving skills  Continued Clinical Symptoms:  Depression:   Impulsivity Alcohol/Substance Abuse/Dependencies  Cognitive Features That Contribute To Risk:  None    Suicide Risk:  Minimal: No identifiable suicidal ideation.  Patients presenting with no risk factors but with morbid ruminations; may be classified as minimal risk based on the severity of the depressive symptoms  Follow-up Information    [facility] Follow up.   Specialty:  Addiction Medicine Contact information: [address] [place] [address] [phone]           Plan Of Care/Follow-up recommendations:  Activity:  as tolerated Diet:  normal Tests:  NA Other:  see above for DC plan  Micheal Likenshristopher T Carianne Taira, MD 09/10/2018, 1:06 PM

## 2018-09-10 NOTE — [facility] Suicide Risk Assessment (Signed)
[facility] INPATIENT:  Family/Significant Other Suicide Prevention Education  Suicide Prevention Education:  Patient Refusal for Family/Significant Other Suicide Prevention Education: The patient Michael [name] has refused to provide written consent for family/significant other to be provided Family/Significant Other Suicide Prevention Education during admission and/or prior to discharge.  Physician notified.  Lorri FrederickWierda, Su Duma Jon, LCSW 09/10/2018, 11:22 AM

## 2018-09-10 NOTE — Progress Notes (Signed)
Patient ID: Michael [name], male   DOB: 11/30/1980, 37 y.o.   MRN: [id number]  Nursing Progress Note [id number]  Data: Patient presents pleasant and cooperative this morning. Patient is seen up in the milieu and is interacting with peers in the dayroom. Patient endorses withdrawal symptoms and irritability to writer, although this is not congruent with patient's presentation to Clinical research associatewriter. Patient does endorse R knee pain and asks for "something stronger than Ibuprofen". Patient encouraged to speak to provider about medication changes and does agree to take prescribed Ibuprofen. Patient compliant with scheduled medications. Patient completed self-inventory sheet and rates depression, hopelessness, and anxiety 7,8,8 respectively. Patient rates their sleep and appetite as good/good respectively. Patient states goal for today is to "try to be nice". Patient is seen attending groups. Patient currently denies SI/HI/AVH.   Action: Patient is educated about and provided medication per provider's orders. Patient safety maintained with q15 min safety checks and frequent rounding. High fall risk precautions in place. Emotional support given. 1:1 interaction and active listening provided. Patient encouraged to attend meals, groups, and work on treatment plan and goals. Labs, vital signs and patient behavior monitored throughout shift.   Response: Patient remains safe on the unit at this time and agrees to come to staff with any issues/concerns. Patient is interacting with peers appropriately on the unit. Will continue to support and monitor.

## 2018-09-10 NOTE — Progress Notes (Signed)
[facility] MD Progress Note  09/10/2018 8:35 AM Michael [name]  MRN:  [id number] Subjective:    History as per psychiatric intake: 37 year old male, presented to ED with family, due to mental status changes, somnolence. Required Geodon for agitation. Patient reports he feels this was due to " being tired that day". He endorses history of opiate ( heroin) , cocaine ( crack) use disorder . Endorses using Xanax at times, but denies any recent BZD abuse . Denies IVDA.Of note, admission UDS was positive for BZD. BAL was negative. He states he is unsure exactly when he last used above substances. States " I guess it was about 5 days ago). Patient endorses history of depression and states, " I feel I have been depressed most of my life ". Denies suicidal plans or intentions , but does endorse some passive thoughts of death, dying and states " I don't think anybody is going to miss me". Endorses neuro-vegetative symptoms as below.  As per evaluation: Today upon evaluation, pt shares, "I'm okay - I've been having some shoulder pain." Discussed with patient about utilizing available PRN's of ibuprofen and acetaminophen, and pt verbalized good understanding. He denies any other specific concerns aside from some ongoing mild depression. Pt shares, "I don't know what to do after this - I don't know if I should go back to school or what." Discussed with patient about reflecting on long-term goals and then establishing short-term goals to work towards that with first step being to detox and get in to residential substance use treatment, and pt verbalized good understanding and confirmed he remains in agreement with plan to be referred to residential treatment. He is sleeping well. His appetite is good. He denies other physical complaints. He denies SI/HI/AH/VH. He is tolerating his medications well, and he is in agreement to continue his current regimen without changes. Pt was in agreement with the above  plan, and he had no further questions, comments, or concerns.  Principal Problem: Severe recurrent major depression without psychotic features (HCC) Diagnosis: Principal Problem:   Severe recurrent major depression without psychotic features (HCC) Active Problems:   Opioid dependence with opioid-induced mood disorder (HCC)  Total Time spent with patient: 30 minutes  Past Psychiatric History: see H&P  Past Medical History:  Past Medical History:  Diagnosis Date  . Polysubstance abuse (HCC)   . Sciatica   . Shoulder dislocation     Past Surgical History:  Procedure Laterality Date  . ELBOW FRACTURE SURGERY     Family History: History reviewed. No pertinent family history. Family Psychiatric  History: see H&P Social History:  Social History   Substance and Sexual Activity  Alcohol Use Yes   Comment: "rarely"     Social History   Substance and Sexual Activity  Drug Use Yes  . Types: IV, Benzodiazepines   Comment: Heroin, Crack yesterday    Social History   Socioeconomic History  . Marital status: Single    Spouse name: Not on file  . Number of children: Not on file  . Years of education: Not on file  . Highest education level: Not on file  Occupational History  . Not on file  Social Needs  . Financial resource strain: Not on file  . Food insecurity:    Worry: Not on file    Inability: Not on file  . Transportation needs:    Medical: Not on file    Non-medical: Not on file  Tobacco Use  . Smoking  status: Current Every Day Smoker    Packs/day: 1.00    Types: Cigarettes  . Smokeless tobacco: Never Used  . Tobacco comment: Patient refused smoking cessation education  Substance and Sexual Activity  . Alcohol use: Yes    Comment: "rarely"  . Drug use: Yes    Types: IV, Benzodiazepines    Comment: Heroin, Crack yesterday  . Sexual activity: Yes    Birth control/protection: None  Lifestyle  . Physical activity:    Days per week: Not on file    Minutes per  session: Not on file  . Stress: Not on file  Relationships  . Social connections:    Talks on phone: Not on file    Gets together: Not on file    Attends religious service: Not on file    Active member of club or organization: Not on file    Attends meetings of clubs or organizations: Not on file    Relationship status: Not on file  Other Topics Concern  . Not on file  Social History Narrative  . Not on file   Additional Social History:                         Sleep: Good  Appetite:  Good  Current Medications: Current Facility-Administered Medications  Medication Dose Route Frequency Provider Last Rate Last Dose  . acetaminophen (TYLENOL) tablet 650 mg  650 mg Oral Q6H PRN Nira Conn A, NP   650 mg at 09/09/18 1651  . alum & mag hydroxide-simeth (MAALOX/MYLANTA) 200-200-20 MG/5ML suspension 30 mL  30 mL Oral Q4H PRN Jackelyn Poling, NP      . Melene Muller ON 09/11/2018] cloNIDine (CATAPRES) tablet 0.1 mg  0.1 mg Oral QAC breakfast Cobos, Rockey Situ, MD      . dicyclomine (BENTYL) tablet 20 mg  20 mg Oral Q6H PRN Cobos, Rockey Situ, MD      . gabapentin (NEURONTIN) capsule 100 mg  100 mg Oral TID Cobos, Rockey Situ, MD   100 mg at 09/10/18 0748  . guaiFENesin-dextromethorphan (ROBITUSSIN DM) 100-10 MG/5ML syrup 5 mL  5 mL Oral Q4H PRN Micheal Likens, MD   5 mL at 09/09/18 2038  . hydrOXYzine (ATARAX/VISTARIL) tablet 25 mg  25 mg Oral Q6H PRN Cobos, Rockey Situ, MD   25 mg at 09/08/18 1825  . ibuprofen (ADVIL,MOTRIN) tablet 600 mg  600 mg Oral Q6H PRN Kerry Hough, PA-C   600 mg at 09/10/18 0750  . loperamide (IMODIUM) capsule 2-4 mg  2-4 mg Oral PRN Cobos, Rockey Situ, MD      . magnesium hydroxide (MILK OF MAGNESIA) suspension 30 mL  30 mL Oral Daily PRN Nira Conn A, NP      . methocarbamol (ROBAXIN) tablet 500 mg  500 mg Oral Q8H PRN Cobos, Rockey Situ, MD   500 mg at 09/08/18 2109  . multivitamin with minerals tablet 1 tablet  1 tablet Oral Daily Cobos, Rockey Situ, MD   1 tablet at 09/10/18 0749  . nicotine (NICODERM CQ - dosed in mg/24 hours) patch 21 mg  21 mg Transdermal Daily Cobos, Rockey Situ, MD   21 mg at 09/10/18 0749  . ondansetron (ZOFRAN-ODT) disintegrating tablet 4 mg  4 mg Oral Q6H PRN Cobos, Rockey Situ, MD      . pantoprazole (PROTONIX) EC tablet 40 mg  40 mg Oral Daily Nira Conn A, NP   40 mg at 09/10/18 0748  .  sertraline (ZOLOFT) tablet 50 mg  50 mg Oral Daily Micheal Likensainville, Lelend Heinecke T, MD   50 mg at 09/10/18 0748  . thiamine (VITAMIN B-1) tablet 100 mg  100 mg Oral Daily Cobos, Rockey SituFernando A, MD   100 mg at 09/10/18 0749  . traZODone (DESYREL) tablet 50 mg  50 mg Oral QHS PRN Cobos, Rockey SituFernando A, MD   50 mg at 09/09/18 2122    Lab Results: No results found for this or any previous visit (from the past 48 hour(s)).  Blood Alcohol level:  Lab Results  Component Value Date   ETH <10 09/03/2018   ETH <10 08/19/2018    Metabolic Disorder Labs: No results found for: HGBA1C, MPG No results found for: PROLACTIN Lab Results  Component Value Date   CHOL 139 04/06/2018   TRIG 146 04/06/2018   HDL 37 (L) 04/06/2018   CHOLHDL 3.8 04/06/2018   VLDL 29 04/06/2018   LDLCALC 73 04/06/2018    Physical Findings: AIMS: Facial and Oral Movements Muscles of Facial Expression: None, normal Lips and Perioral Area: None, normal Jaw: None, normal Tongue: None, normal,Extremity Movements Upper (arms, wrists, hands, fingers): None, normal Lower (legs, knees, ankles, toes): None, normal, Trunk Movements Neck, shoulders, hips: None, normal, Overall Severity Severity of abnormal movements (highest score from questions above): None, normal Incapacitation due to abnormal movements: None, normal Patient's awareness of abnormal movements (rate only patient's report): No Awareness, Dental Status Current problems with teeth and/or dentures?: No Does patient usually wear dentures?: No  CIWA:  CIWA-Ar Total: 1 COWS:  COWS Total Score:  1  Musculoskeletal: Strength & Muscle Tone: within normal limits Gait & Station: normal Patient leans: N/A  Psychiatric Specialty Exam: Physical Exam  Nursing note and vitals reviewed.   Review of Systems  Constitutional: Negative for chills and fever.  Respiratory: Negative for cough and shortness of breath.   Cardiovascular: Negative for chest pain.  Gastrointestinal: Negative for abdominal pain, heartburn, nausea and vomiting.  Psychiatric/Behavioral: Negative for depression, hallucinations and suicidal ideas. The patient is not nervous/anxious and does not have insomnia.     Blood pressure 120/78, pulse 90, temperature 98.7 F (37.1 C), temperature source Oral, resp. rate 16, height 5\' 6"  (1.676 m), weight 83.9 kg, SpO2 100 %.Body mass index is 29.86 kg/m.  General Appearance: Casual and Fairly Groomed  Eye Contact:  Good  Speech:  Clear and Coherent and Normal Rate  Volume:  Normal  Mood:  Depressed  Affect:  Congruent, Constricted and Depressed  Thought Process:  Coherent and Goal Directed  Orientation:  Full (Time, Place, and Person)  Thought Content:  Logical  Suicidal Thoughts:  No  Homicidal Thoughts:  No  Memory:  Immediate;   Fair Recent;   Fair Remote;   Fair  Judgement:  Fair  Insight:  Lacking  Psychomotor Activity:  Normal  Concentration:  Concentration: Fair  Recall:  [employer]Fair  Fund of Knowledge:  Fair  Language:  Fair  Akathisia:  No  Handed:    AIMS (if indicated):     Assets:  Resilience Social Support  ADL's:  Intact  Cognition:  WNL  Sleep:  Number of Hours: 6.75   Treatment Plan Summary: Daily contact with patient to assess and evaluate symptoms and progress in treatment and Medication management   -Continue inpatient hospitalization.  -MDD, recurrent, severe, without psychosis -Continue zoloft 50mg  po qDay  -Opioid dependence -Continue COWS with clonidine taper (will complete on  11/22)  -alcohol/benzodiazepine abuse (and withdrawal) -Completed CIWA  with ativan taper.   -SW team continues to work on referral to residential substance use treatment  -anxiety -Continue gabapentin 100mg  po TID -Continue vistaril 25mg  po q6h prn anxiety  -insomnia -Continue trazodone 50mg  po qhs prn insomnia  -GERD -Continue potonix 40mg  po qDay  -Encourage participation in groups and therapeutic milieu  -disposition planning will be ongoing   Micheal Likens, MD 09/10/2018, 8:35 AM

## 2018-09-10 NOTE — Discharge Summary (Signed)
Physician Discharge Summary Note  Patient:  Michael [name] is an 37 y.o., male MRN:  [id number] DOB:  [birth year] Patient phone:  [phone] (home)  Patient address:   [address] [address],  Total Time spent with patient: 30 minutes  Date of Admission:  09/06/2018 Date of Discharge: 09/10/2018  Reason for Admission:  Depression, polysubstance abuse  Principal Problem: Severe recurrent major depression without psychotic features [facility]) Discharge Diagnoses: Principal Problem:   Severe recurrent major depression without psychotic features (HCC) Active Problems:   Opioid dependence with opioid-induced mood disorder (HCC)   Past Psychiatric History: see H&P  Past Medical History:  Past Medical History:  Diagnosis Date  . Polysubstance abuse (HCC)   . Sciatica   . Shoulder dislocation     Past Surgical History:  Procedure Laterality Date  . ELBOW FRACTURE SURGERY     Family History: History reviewed. No pertinent family history. Family Psychiatric  History: see H&P Social History:  Social History   Substance and Sexual Activity  Alcohol Use Yes   Comment: "rarely"     Social History   Substance and Sexual Activity  Drug Use Yes  . Types: IV, Benzodiazepines   Comment: Heroin, Crack yesterday    Social History   Socioeconomic History  . Marital status: Single    Spouse name: Not on file  . Number of children: Not on file  . Years of education: Not on file  . Highest education level: Not on file  Occupational History  . Not on file  Social Needs  . Financial resource strain: Not on file  . Food insecurity:    Worry: Not on file    Inability: Not on file  . Transportation needs:    Medical: Not on file    Non-medical: Not on file  Tobacco Use  . Smoking status: Current Every Day Smoker    Packs/day: 1.00    Types: Cigarettes  . Smokeless tobacco: Never Used  . Tobacco comment: Patient refused smoking cessation education   Substance and Sexual Activity  . Alcohol use: Yes    Comment: "rarely"  . Drug use: Yes    Types: IV, Benzodiazepines    Comment: Heroin, Crack yesterday  . Sexual activity: Yes    Birth control/protection: None  Lifestyle  . Physical activity:    Days per week: Not on file    Minutes per session: Not on file  . Stress: Not on file  Relationships  . Social connections:    Talks on phone: Not on file    Gets together: Not on file    Attends religious service: Not on file    Active member of club or organization: Not on file    Attends meetings of clubs or organizations: Not on file    Relationship status: Not on file  Other Topics Concern  . Not on file  Social History Narrative  . Not on file    Hospital Course:     History as per psychiatric intake: 37 year old male, presented to ED with family, due to mental status changes, somnolence. Required Geodon for agitation. Patient reports he feels this was due to " being tired that day". He endorses history of opiate ( heroin) , cocaine ( crack) use disorder . Endorses using Xanax at times, but denies any recent BZD abuse . Denies IVDA.Of note, admission UDS was positive for BZD. BAL was negative. He states he is unsure exactly when he last  used above substances. States " I guess it was about 5 days ago). Patient endorses history of depression and states, " I feel I have been depressed most of my life ". Denies suicidal plans or intentions , but does endorse some passive thoughts of death, dying and states " I don't think anybody is going to miss me". Endorses neuro-vegetative symptoms as below.  As per evaluation: Today upon evaluation, pt shares, "I'mokay - I've been having some shoulder pain." Discussed with patient about utilizing available PRN's of ibuprofen and acetaminophen, and pt verbalized good understanding. He denies any other specific concerns aside from some ongoing mild depression. Pt shares, "I don't know what  to do after this - I don't know if I should go back to school or what." Discussed with patient about reflecting on long-term goals and then establishing short-term goals to work towards that with first step being to detox and get in to residential substance use treatment, and pt verbalized good understanding and confirmed he remains in agreement with plan to be referred to residential treatment.He is sleeping well. His appetite is good. He denies other physical complaints. He denies SI/HI/AH/VH. He is tolerating his medications well, and he is in agreement to continue his current regimen without changes. Pt was in agreement with the above plan, and he had no further questions, comments, or concerns.  Later in afternoon, pt [facility], and he confirmed that he was in agreement with this plan. He is in agreement to continue his current treatment regimen without changes. He was able to engage in safety planning including [facility] or contact emergency services if he feels unable to maintain his own safety or the safety of others. Pt had no further questions, comments, or concerns.   The patient is at low risk of imminent suicide. Patient denied thoughts, intent, or plan for harm to self or others, expressed significant future orientation, and expressed an ability to mobilize assistance for his needs. He is presently void of any contributing psychiatric symptoms, cognitive difficulties, or substance use which would elevate his risk for lethality. Chronic risk for lethality is elevated in light of poor social support, poor adherence, and impulsivity. The chronic risk is presently mitigated by his ongoing [facility] and [facility] and mobilization of support from family and friends. Chronic risk may elevate if he experiences any significant loss or worsening of symptoms, which can be managed and monitored through outpatient providers. At this  time, acute risk for lethality is low and he is stable for ongoing outpatient management.    Modifiable risk factors were addressed during this hospitalization through appropriate pharmacotherapy and establishment of outpatient follow-up treatment. Some risk factors for suicide are situational (i.e. Unstable social support) or related personality pathology (i.e. Poor coping mechanisms) and thus cannot be further mitigated by continued hospitalization in this setting.   Physical Findings: AIMS: Facial and Oral Movements Muscles of Facial Expression: None, normal Lips and Perioral Area: None, normal Jaw: None, normal Tongue: None, normal,Extremity Movements Upper (arms, wrists, hands, fingers): None, normal Lower (legs, knees, ankles, toes): None, normal, Trunk Movements Neck, shoulders, hips: None, normal, Overall Severity Severity of abnormal movements (highest score from questions above): None, normal Incapacitation due to abnormal movements: None, normal Patient's awareness of abnormal movements (rate only patient's report): No Awareness, Dental Status Current problems with teeth and/or dentures?: No Does patient usually wear dentures?: No  CIWA:  CIWA-Ar Total: 1  COWS:  COWS Total Score: 3  Musculoskeletal: Strength & Muscle Tone: within normal limits Gait & Station: normal Patient leans: N/A  Psychiatric Specialty Exam: Physical Exam  Nursing note and vitals reviewed.   Review of Systems  Constitutional: Negative for chills and fever.  Respiratory: Negative for cough and shortness of breath.   Cardiovascular: Negative for chest pain.  Gastrointestinal: Negative for abdominal pain, heartburn, nausea and vomiting.  Psychiatric/Behavioral: Negative for depression, hallucinations and suicidal ideas. The patient is not nervous/anxious and does not have insomnia.     Blood pressure 120/78, pulse 90, temperature 98.7 F (37.1 C), temperature source Oral, resp. rate 16, height 5\' 6"   (1.676 m), weight 83.9 kg, SpO2 100 %.Body mass index is 29.86 kg/m.  General Appearance: Casual and Fairly Groomed  Eye Contact:  Good  Speech:  Clear and Coherent and Normal Rate  Volume:  Normal  Mood:  Euthymic  Affect:  Appropriate and Congruent  Thought Process:  Coherent and Goal Directed  Orientation:  Full (Time, Place, and Person)  Thought Content:  Logical  Suicidal Thoughts:  No  Homicidal Thoughts:  No  Memory:  Immediate;   Fair Recent;   Fair Remote;   Fair  Judgement:  Fair  Insight:  Fair  Psychomotor Activity:  Normal  Concentration:  Concentration: Fair  Recall:  Fair  Fund of Knowledge:  Fair  Language:  Fair  Akathisia:  No  Handed:    AIMS (if indicated):     Assets:  Communication Skills Desire for Improvement Resilience Social Support Talents/Skills  ADL's:  Intact  Cognition:  WNL  Sleep:  Number of Hours: 6.75     Have you used any form of tobacco in the last 30 days? (Cigarettes, Smokeless Tobacco, Cigars, and/or Pipes): Yes  Has this patient used any form of tobacco in the last 30 days? (Cigarettes, Smokeless Tobacco, Cigars, and/or Pipes) Yes, Yes, A prescription for an FDA-approved tobacco cessation medication was offered at discharge and the patient refused  Blood Alcohol level:  Lab Results  Component Value Date   ETH <10 09/03/2018   ETH <10 08/19/2018    Metabolic Disorder Labs:  No results found for: HGBA1C, MPG No results found for: PROLACTIN Lab Results  Component Value Date   CHOL 139 04/06/2018   TRIG 146 04/06/2018   HDL 37 (L) 04/06/2018   CHOLHDL 3.8 04/06/2018   VLDL 29 04/06/2018   LDLCALC 73 04/06/2018    See Psychiatric Specialty Exam and Suicide Risk Assessment completed by Attending Physician prior to discharge.  Discharge destination:  [facility]  Is patient on multiple antipsychotic therapies at discharge:  No   Has Patient had three or more failed trials of antipsychotic monotherapy by history:   No  Recommended Plan for Multiple Antipsychotic Therapies: NA   Allergies as of 09/10/2018   No Known Allergies     Medication List    STOP taking these medications   acetaminophen 325 MG tablet Commonly known as:  TYLENOL   Buprenorphine HCl-Naloxone HCl 8-2 MG Film   cloNIDine 0.1 MG tablet Commonly known as:  CATAPRES   nicotine 21 mg/24hr patch Commonly known as:  NICODERM CQ - dosed in mg/24 hours     TAKE these medications     Indication  gabapentin 100 MG capsule Commonly known as:  NEURONTIN Take 1 capsule (100 mg total) by mouth 3 (three) times daily.  Indication:  Anxiety   hydrOXYzine 25 MG tablet Commonly known as:  ATARAX/VISTARIL  Take 1 tablet (25 mg total) by mouth every 6 (six) hours as needed for anxiety.  Indication:  Feeling Anxious   pantoprazole 40 MG tablet Commonly known as:  PROTONIX Take 1 tablet (40 mg total) by mouth daily.  Indication:  Gastroesophageal Reflux Disease   sertraline 50 MG tablet Commonly known as:  ZOLOFT Take 1 tablet (50 mg total) by mouth daily. Start taking on:  09/11/2018  Indication:  Major Depressive Disorder   traZODone 50 MG tablet Commonly known as:  DESYREL Take 1 tablet (50 mg total) by mouth at bedtime as needed for sleep.  Indication:  Trouble Sleeping      Follow-up Information    [facility] Follow up.   Specialty:  Addiction Medicine Contact information: [address] [place] [address] [phone]           Follow-up recommendations:  Activity:  as tolerated Diet:  normal Tests:  NA Other:  see above for DC plan  Comments:    Signed: Micheal Likens, MD 09/10/2018, 1:06 PM

## 2018-09-10 NOTE — Progress Notes (Signed)
Pt completed phone screening with [facility].  CS[facility] asking for updated MD note documenting to SI/AV and current meds.  Shayla reports pt will most likely be able to come tonight. Garner NashGregory Lichelle Viets, MSW, LCSW Clinical Social Worker 09/10/2018 11:28 AM

## 2018-09-10 NOTE — Discharge Instructions (Signed)
Discharge instructions reviewed with pt prior to being discharged to the care of [facility] [place]  Paperwork and medication supply along with prescriptions given to [facility] staff member.

## 2018-09-10 NOTE — Plan of Care (Signed)
  Problem: Education: Goal: Mental status will improve Outcome: Progressing   Problem: Activity: Goal: Interest or engagement in activities will improve Outcome: Progressing   Problem: Coping: Goal: Ability to demonstrate self-control will improve Outcome: Progressing   Problem: Health Behavior/Discharge Planning: Goal: Compliance with treatment plan for underlying cause of condition will improve Outcome: Progressing   Problem: Physical Regulation: Goal: Ability to maintain clinical measurements within normal limits will improve Outcome: Progressing   Problem: Safety: Goal: Periods of time without injury will increase Outcome: Progressing

## 2018-09-10 NOTE — Progress Notes (Signed)
  [facility] :  Will you be returning to the same living situation after discharge:  No. [facility]. At discharge, do you have transportation home?: No. [facility] to transport. Do you have the ability to pay for your medications: No. [facility].  Release of information consent forms completed and in the chart;  Patient's signature needed at discharge.  Patient to Follow up at: Follow-up Information    [facility] Go on 09/10/2018.   Specialty:  Addiction Medicine Why:  [facility] and [facility] you to their facility tonight at 9:15pm for residential substance abuse treatment. Contact information: [address] [place] [address] [phone]           Next level of care provider [facility] Link:no  Safety Planning and Suicide Prevention discussed: No. Pt declined consent.   Have you used any form of tobacco in the last 30 days? (Cigarettes, Smokeless Tobacco, Cigars, and/or Pipes): Yes  Has patient been referred to the Quitline?: Patient refused referral. [facility]  Patient has been referred for addiction treatment: Yes  Lorri FrederickWierda, Ramiyah Mcclenahan Jon, LCSW 09/10/2018, 1:25 PM

## 2019-05-12 ENCOUNTER — Inpatient Hospital Stay (HOSPITAL_COMMUNITY)
Admission: EM | Admit: 2019-05-12 | Discharge: 2019-05-16 | DRG: 917 | Disposition: A | Discharge status: Home / Self Care | Payer: Self-pay | Attending: Internal Medicine | Admitting: Internal Medicine

## 2019-05-12 ENCOUNTER — Encounter (HOSPITAL_COMMUNITY): Payer: Self-pay | Admitting: Emergency Medicine

## 2019-05-12 ENCOUNTER — Other Ambulatory Visit: Payer: Self-pay

## 2019-05-12 DIAGNOSIS — F17210 Nicotine dependence, cigarettes, uncomplicated: Secondary | ICD-10-CM | POA: Diagnosis present

## 2019-05-12 DIAGNOSIS — N179 Acute kidney failure, unspecified: Secondary | ICD-10-CM | POA: Diagnosis present

## 2019-05-12 DIAGNOSIS — E871 Hypo-osmolality and hyponatremia: Secondary | ICD-10-CM | POA: Diagnosis present

## 2019-05-12 DIAGNOSIS — R0602 Shortness of breath: Secondary | ICD-10-CM

## 2019-05-12 DIAGNOSIS — J189 Pneumonia, unspecified organism: Secondary | ICD-10-CM

## 2019-05-12 DIAGNOSIS — J9690 Respiratory failure, unspecified, unspecified whether with hypoxia or hypercapnia: Secondary | ICD-10-CM

## 2019-05-12 DIAGNOSIS — J690 Pneumonitis due to inhalation of food and vomit: Secondary | ICD-10-CM | POA: Diagnosis present

## 2019-05-12 DIAGNOSIS — E872 Acidosis: Secondary | ICD-10-CM | POA: Diagnosis present

## 2019-05-12 DIAGNOSIS — F1510 Other stimulant abuse, uncomplicated: Secondary | ICD-10-CM

## 2019-05-12 DIAGNOSIS — K219 Gastro-esophageal reflux disease without esophagitis: Secondary | ICD-10-CM | POA: Diagnosis present

## 2019-05-12 DIAGNOSIS — I10 Essential (primary) hypertension: Secondary | ICD-10-CM | POA: Diagnosis present

## 2019-05-12 DIAGNOSIS — F1310 Sedative, hypnotic or anxiolytic abuse, uncomplicated: Secondary | ICD-10-CM | POA: Diagnosis present

## 2019-05-12 DIAGNOSIS — Y92009 Unspecified place in unspecified non-institutional (private) residence as the place of occurrence of the external cause: Secondary | ICD-10-CM

## 2019-05-12 DIAGNOSIS — R040 Epistaxis: Secondary | ICD-10-CM | POA: Diagnosis not present

## 2019-05-12 DIAGNOSIS — E876 Hypokalemia: Secondary | ICD-10-CM | POA: Diagnosis present

## 2019-05-12 DIAGNOSIS — T50904A Poisoning by unspecified drugs, medicaments and biological substances, undetermined, initial encounter: Secondary | ICD-10-CM

## 2019-05-12 DIAGNOSIS — Z20828 Contact with and (suspected) exposure to other viral communicable diseases: Secondary | ICD-10-CM | POA: Diagnosis present

## 2019-05-12 DIAGNOSIS — F332 Major depressive disorder, recurrent severe without psychotic features: Secondary | ICD-10-CM | POA: Diagnosis present

## 2019-05-12 DIAGNOSIS — T401X2A Poisoning by heroin, intentional self-harm, initial encounter: Secondary | ICD-10-CM | POA: Diagnosis present

## 2019-05-12 DIAGNOSIS — J9601 Acute respiratory failure with hypoxia: Secondary | ICD-10-CM | POA: Diagnosis present

## 2019-05-12 DIAGNOSIS — E860 Dehydration: Secondary | ICD-10-CM | POA: Diagnosis present

## 2019-05-12 DIAGNOSIS — Z915 Personal history of self-harm: Secondary | ICD-10-CM

## 2019-05-12 DIAGNOSIS — T50902A Poisoning by unspecified drugs, medicaments and biological substances, intentional self-harm, initial encounter: Secondary | ICD-10-CM | POA: Diagnosis present

## 2019-05-12 DIAGNOSIS — T43622A Poisoning by amphetamines, intentional self-harm, initial encounter: Principal | ICD-10-CM | POA: Diagnosis present

## 2019-05-12 LAB — COMPREHENSIVE METABOLIC PANEL
ALT: 20 U/L (ref 0–44)
AST: 32 U/L (ref 15–41)
Albumin: 4.5 g/dL (ref 3.5–5.0)
Alkaline Phosphatase: 68 U/L (ref 38–126)
Anion gap: 12 (ref 5–15)
BUN: 15 mg/dL (ref 6–20)
CO2: 18 mmol/L — ABNORMAL LOW (ref 22–32)
Calcium: 9.4 mg/dL (ref 8.9–10.3)
Chloride: 100 mmol/L (ref 98–111)
Creatinine, Ser: 1.4 mg/dL — ABNORMAL HIGH (ref 0.61–1.24)
GFR calc Af Amer: >60 mL/min (ref >60)
GFR calc non Af Amer: >60 mL/min (ref >60)
Glucose, Bld: 92 mg/dL (ref 70–99)
Potassium: 2.9 mmol/L — ABNORMAL LOW (ref 3.5–5.1)
Sodium: 130 mmol/L — ABNORMAL LOW (ref 135–145)
Total Bilirubin: 1.1 mg/dL (ref 0.3–1.2)
Total Protein: 7.8 g/dL (ref 6.5–8.1)

## 2019-05-12 LAB — CBC WITH DIFFERENTIAL/PLATELET
Abs Immature Granulocytes: 0.03 10*3/uL (ref 0.00–0.07)
Basophils Absolute: 0.1 10*3/uL (ref 0.0–0.1)
Basophils Relative: 1 %
Eosinophils Absolute: 0 10*3/uL (ref 0.0–0.5)
Eosinophils Relative: 0 %
HCT: 40.2 % (ref 39.0–52.0)
Hemoglobin: 14 g/dL (ref 13.0–17.0)
Immature Granulocytes: 0 %
Lymphocytes Relative: 19 %
Lymphs Abs: 2 10*3/uL (ref 0.7–4.0)
MCH: 29.5 pg (ref 26.0–34.0)
MCHC: 34.8 g/dL (ref 30.0–36.0)
MCV: 84.8 fL (ref 80.0–100.0)
Monocytes Absolute: 1 10*3/uL (ref 0.1–1.0)
Monocytes Relative: 10 %
Neutro Abs: 7.5 10*3/uL (ref 1.7–7.7)
Neutrophils Relative %: 70 %
Platelets: 291 10*3/uL (ref 150–400)
RBC: 4.74 MIL/uL (ref 4.22–5.81)
RDW: 11.9 % (ref 11.5–15.5)
WBC: 10.6 10*3/uL — ABNORMAL HIGH (ref 4.0–10.5)
nRBC: 0 % (ref 0.0–0.2)

## 2019-05-12 LAB — SALICYLATE LEVEL: Salicylate Lvl: <7 mg/dL (ref 2.8–30.0)

## 2019-05-12 LAB — ETHANOL: Alcohol, Ethyl (B): <10 mg/dL (ref <10)

## 2019-05-12 LAB — LIPASE, BLOOD: Lipase: 24 U/L (ref 11–51)

## 2019-05-12 LAB — CK: Total CK: 875 U/L — ABNORMAL HIGH (ref 49–397)

## 2019-05-12 LAB — ACETAMINOPHEN LEVEL: Acetaminophen (Tylenol), Serum: <10 ug/mL — ABNORMAL LOW (ref 10–30)

## 2019-05-12 MED ORDER — STERILE WATER FOR INJECTION IJ SOLN
INTRAMUSCULAR | Status: AC
  Administered 2019-05-12: 10 mL
  Filled 2019-05-12: qty 10

## 2019-05-12 MED ORDER — HALOPERIDOL LACTATE 5 MG/ML IJ SOLN
5.0000 mg | Freq: Once | INTRAMUSCULAR | Status: AC
  Administered 2019-05-12: 5 mg via INTRAMUSCULAR
  Filled 2019-05-12: qty 1

## 2019-05-12 MED ORDER — DIPHENHYDRAMINE HCL 50 MG/ML IJ SOLN
50.0000 mg | Freq: Once | INTRAMUSCULAR | Status: AC
  Administered 2019-05-12: 50 mg via INTRAMUSCULAR
  Filled 2019-05-12: qty 1

## 2019-05-12 MED ORDER — ZIPRASIDONE MESYLATE 20 MG IM SOLR
20.0000 mg | Freq: Once | INTRAMUSCULAR | Status: AC
  Administered 2019-05-12: 20 mg via INTRAMUSCULAR
  Filled 2019-05-12: qty 20

## 2019-05-12 MED ORDER — SODIUM CHLORIDE 0.9 % IV BOLUS
2000.0000 mL | Freq: Once | INTRAVENOUS | Status: AC
  Administered 2019-05-12: 2000 mL via INTRAVENOUS

## 2019-05-12 MED ORDER — LORAZEPAM 2 MG/ML IJ SOLN
2.0000 mg | Freq: Once | INTRAMUSCULAR | Status: AC
  Administered 2019-05-12: 2 mg via INTRAMUSCULAR
  Filled 2019-05-12: qty 1

## 2019-05-12 MED ORDER — SODIUM CHLORIDE 0.9 % IV BOLUS
1000.0000 mL | Freq: Once | INTRAVENOUS | Status: AC
  Administered 2019-05-12: 1000 mL via INTRAVENOUS

## 2019-05-12 MED ORDER — POTASSIUM CHLORIDE 10 MEQ/100ML IV SOLN
10.0000 meq | INTRAVENOUS | Status: AC
  Administered 2019-05-12 – 2019-05-13 (×4): 10 meq via INTRAVENOUS
  Filled 2019-05-12 (×4): qty 100

## 2019-05-12 MED ORDER — LORAZEPAM 2 MG/ML IJ SOLN
1.0000 mg | Freq: Once | INTRAMUSCULAR | Status: AC
  Administered 2019-05-12: 1 mg via INTRAVENOUS
  Filled 2019-05-12: qty 1

## 2019-05-12 NOTE — ED Notes (Signed)
Patient being belligerent and yelling. Making no sense. Threatening officers. Pt given medication per orders

## 2019-05-12 NOTE — ED Notes (Signed)
Pt asleep at this time

## 2019-05-12 NOTE — ED Notes (Signed)
Pt very agitated and restless in bed. Yelling and screaming. Restraints applied per protocol. Will continue to monitor.

## 2019-05-12 NOTE — ED Notes (Signed)
Pt's mother is Emori Kamau, she called and was updated on her son's condition. She can be reached at [phone].

## 2019-05-12 NOTE — ED Triage Notes (Signed)
Pt states he done meth, heroin, and suboxone over the last couple of days. Pt states he just wants to die.

## 2019-05-12 NOTE — ED Provider Notes (Signed)
Emergency Department Provider Note   I have reviewed the triage vital signs and the nursing notes.   HISTORY  Chief Complaint Drug Overdose   HPI Michael [name] is a 38 y.o. male with PMH of polysubstance abuse presents to the emergency department by EMS with agitation.  He apparently called 911 with concern for heroin overdose.  Patient states that he been abusing both heroin and crystal meth tonight.  EMS state that when they arrived on scene he ran out toward the truck and then fell to the ground.  He has been agitated and intermittently flailing around in the truck.  He has not been aggressive toward the EMS team.  He denies suicidal ideation but due to his level of intoxication, Level 5 caveat applies.   Past Medical History:  Diagnosis Date  . Polysubstance abuse ([place])   . Sciatica   . Shoulder dislocation     Patient Active Problem List   Diagnosis Date Noted  . Drug overdose, intentional ([place]) 05/13/2019  . Severe recurrent major depression without psychotic features ([place]) 09/06/2018  . AMS (altered mental status)   . Essential hypertension   . Gastroesophageal reflux disease   . Opioid dependence with opioid-induced mood disorder ([place])   . Drug overdose 09/03/2018  . OCD (obsessive compulsive disorder) 04/13/2018  . Major depressive disorder, recurrent severe without psychotic features ([place]) 04/06/2018  . Cannabis use disorder, moderate, dependence ([place]) 04/06/2018  . Cocaine use disorder, moderate, dependence ([place]) 04/06/2018  . Sedative, hypnotic or anxiolytic use disorder, severe, dependence ([place]) 04/06/2018  . Tobacco abuse 04/06/2018    Past Surgical History:  Procedure Laterality Date  . ELBOW FRACTURE SURGERY      Allergies Patient has no known allergies.  No family history on file.  Social History Social History   Tobacco Use  . Smoking status: Current Every Day Smoker    Packs/day: 1.00    Types: Cigarettes  . Smokeless tobacco:  Never Used  . Tobacco comment: Patient refused smoking cessation education  Substance Use Topics  . Alcohol use: Yes    Comment: "rarely"  . Drug use: Yes    Types: IV, Benzodiazepines    Comment: Heroin, Crack yesterday    Review of Systems  Level 5 caveat: Acute agitation and intoxication.   ____________________________________________   PHYSICAL EXAM:  VITAL SIGNS: ED Triage Vitals  Enc Vitals Group     BP 05/12/19 2155 138/81     Pulse Rate 05/12/19 2156 95     Resp 05/12/19 2159 16     Temp 05/12/19 2159 98.9 F (37.2 C)     Temp Source 05/12/19 2159 Axillary     SpO2 05/12/19 2156 100 %   Constitutional: Hyperalert and agitated. No acute distress.  Eyes: Conjunctivae are normal.  Head: Atraumatic. Nose: No congestion/rhinnorhea. Mouth/Throat: Mucous membranes are moist.  Neck: No stridor.  Cardiovascular: Normal rate, regular rhythm. Good peripheral circulation. Grossly normal heart sounds.   Respiratory: Normal respiratory effort.  No retractions. Lungs CTAB. Gastrointestinal: No distention.  Musculoskeletal: No gross deformities of extremities. Neurologic: No gross focal neurologic deficits are appreciated.  Skin:  Skin is warm, dry and intact.   ____________________________________________   LABS (all labs ordered are listed, but only abnormal results are displayed)  Labs Reviewed  COMPREHENSIVE METABOLIC PANEL - Abnormal; Notable for the following components:      Result Value   Sodium 130 (*)    Potassium 2.9 (*)    CO2 18 (*)  Creatinine, Ser 1.40 (*)    All other components within normal limits  CBC WITH DIFFERENTIAL/PLATELET - Abnormal; Notable for the following components:   WBC 10.6 (*)    All other components within normal limits  CK - Abnormal; Notable for the following components:   Total CK 875 (*)    All other components within normal limits  RAPID URINE DRUG SCREEN, HOSP PERFORMED - Abnormal; Notable for the following components:    Amphetamines POSITIVE (*)    All other components within normal limits  ACETAMINOPHEN LEVEL - Abnormal; Notable for the following components:   Acetaminophen (Tylenol), Serum <10 (*)    All other components within normal limits  BLOOD GAS, ARTERIAL - Abnormal; Notable for the following components:   pH, Arterial 7.274 (*)    pO2, Arterial 212 (*)    Bicarbonate 18.7 (*)    Acid-base deficit 6.6 (*)    All other components within normal limits  CK - Abnormal; Notable for the following components:   Total CK 2,034 (*)    All other components within normal limits  BASIC METABOLIC PANEL - Abnormal; Notable for the following components:   Glucose, Bld 114 (*)    Calcium 7.9 (*)    Anion gap 4 (*)    All other components within normal limits  CBG MONITORING, ED - Abnormal; Notable for the following components:   Glucose-Capillary 103 (*)    All other components within normal limits  SARS CORONAVIRUS 2 (HOSPITAL ORDER, [facility])  CULTURE, BLOOD (ROUTINE X 2)  CULTURE, BLOOD (ROUTINE X 2)  MRSA PCR SCREENING  LIPASE, BLOOD  ETHANOL  SALICYLATE LEVEL  LACTIC ACID, PLASMA  LACTIC ACID, PLASMA  CBC  CREATININE, SERUM  TRIGLYCERIDES  HIV ANTIBODY (ROUTINE TESTING W REFLEX)   ____________________________________________  EKG   EKG Interpretation  Date/Time:  Wednesday May 12 2019 21:59:24 EDT Ventricular Rate:  104 PR Interval:    QRS Duration: 93 QT Interval:  349 QTC Calculation: 459 R Axis:   62 Text Interpretation:  Sinus tachycardia Baseline wander in lead(s) V3 No STEMI  Confirmed by Nanda Quinton [phone]) on 05/12/2019 10:34:29 PM       ____________________________________________  RADIOLOGY  None  ____________________________________________   PROCEDURES  Procedure(s) performed:   Procedures  CRITICAL CARE Performed by: Margette Fast Total critical care time: 35 minutes Critical care time was exclusive of separately  billable procedures and treating other patients. Critical care was necessary to treat or prevent imminent or life-threatening deterioration. Critical care was time spent personally by me on the following activities: development of treatment plan with patient and/or surrogate as well as nursing, discussions with consultants, evaluation of patient's response to treatment, examination of patient, obtaining history from patient or surrogate, ordering and performing treatments and interventions, ordering and review of laboratory studies, ordering and review of radiographic studies, pulse oximetry and re-evaluation of patient's condition.  Nanda Quinton, MD Emergency Medicine   ____________________________________________   INITIAL IMPRESSION / ASSESSMENT AND PLAN / ED COURSE  Pertinent labs & imaging results that were available during my care of the patient were reviewed by me and considered in my medical decision making (see chart for details).   Patient presents to the emergency department with unintentional overdose.  He is denying any suicidal ideation at this time but appears acutely intoxicated.  Toxidrome is mostly stimulant at this time.  Patient given Haldol, Benadryl, Ativan.  EKG obtained which shows normal QTC. Plan for  labs and IVF.  Not exhibiting features of excited delirium at this time.  10:48 PM  Patient with improved vitals and more calm after sedation.  Endorsing to staff that "I just want to die." Will need to sober and then see TTS. Patient with intermittent agitation requiring restraints. No fever or hypoxemia. Protecting airway at this time. Care transferred to Dr. Tomi Bamberger.  ____________________________________________  FINAL CLINICAL IMPRESSION(S) / ED DIAGNOSES  Final diagnoses:  Methamphetamine abuse ([place])  Drug overdose, undetermined intent, initial encounter  Acute respiratory failure with hypoxia ([place])     MEDICATIONS GIVEN DURING THIS VISIT:  Medications  0.9 %   sodium chloride infusion ( Intravenous New Bag/Given 05/13/19 1322)  enoxaparin (LOVENOX) injection 40 mg (40 mg Subcutaneous Given 05/13/19 1004)  acetaminophen (TYLENOL) tablet 650 mg (has no administration in time range)    Or  acetaminophen (TYLENOL) suppository 650 mg (has no administration in time range)  ondansetron (ZOFRAN) tablet 4 mg (has no administration in time range)    Or  ondansetron (ZOFRAN) injection 4 mg (has no administration in time range)  piperacillin-tazobactam (ZOSYN) IVPB 3.375 g (0 g Intravenous Stopped 05/13/19 1322)  Chlorhexidine Gluconate Cloth 2 % PADS 6 each (6 each Topical Given 05/13/19 0930)  chlorhexidine gluconate (MEDLINE KIT) (PERIDEX) 0.12 % solution 15 mL (15 mLs Mouth Rinse Given 05/13/19 1012)  MEDLINE mouth rinse (15 mLs Mouth Rinse Given 05/13/19 1153)  pantoprazole (PROTONIX) injection 40 mg (40 mg Intravenous Given 05/13/19 1151)  propofol (DIPRIVAN) 1000 MG/100ML infusion (40 mcg/kg/min  79.5 kg Intravenous Rate/Dose Change 05/13/19 1152)  midazolam (VERSED) injection 2 mg (has no administration in time range)  midazolam (VERSED) injection 2 mg (has no administration in time range)  fentaNYL (SUBLIMAZE) injection 50 mcg (has no administration in time range)  fentaNYL (SUBLIMAZE) injection 50-200 mcg (has no administration in time range)  diphenhydrAMINE (BENADRYL) injection 50 mg (50 mg Intramuscular Given 05/12/19 2157)  haloperidol lactate (HALDOL) injection 5 mg (5 mg Intramuscular Given 05/12/19 2157)  LORazepam (ATIVAN) injection 2 mg (2 mg Intramuscular Given 05/12/19 2157)  sodium chloride 0.9 % bolus 1,000 mL (0 mLs Intravenous Stopped 05/12/19 2257)  sodium chloride 0.9 % bolus 2,000 mL (0 mLs Intravenous Stopped 05/13/19 0142)  potassium chloride 10 mEq in 100 mL IVPB (0 mEq Intravenous Stopped 05/13/19 0437)  ziprasidone (GEODON) injection 20 mg (20 mg Intramuscular Given 05/12/19 2307)  LORazepam (ATIVAN) injection 1 mg (1 mg Intravenous Given  05/12/19 2259)  sterile water (preservative free) injection (10 mLs  Given 05/12/19 2307)  LORazepam (ATIVAN) injection 1 mg (1 mg Intravenous Given 05/13/19 0131)  propofol (DIPRIVAN) 1000 MG/100ML infusion (  Not Given 05/13/19 1010)  etomidate (AMIDATE) injection 20 mg (20 mg Intravenous Given 05/13/19 0329)  succinylcholine (ANECTINE) injection 125 mg (125 mg Intravenous Given 05/13/19 0332)    Note:  This document was prepared using Dragon voice recognition software and may include unintentional dictation errors.  Nanda Quinton, MD Emergency Medicine    Long, [facility], MD 05/13/19 1352

## 2019-05-12 NOTE — ED Notes (Signed)
Pt asleep.

## 2019-05-13 ENCOUNTER — Inpatient Hospital Stay (HOSPITAL_COMMUNITY): Payer: Self-pay

## 2019-05-13 ENCOUNTER — Emergency Department (HOSPITAL_COMMUNITY): Payer: Self-pay

## 2019-05-13 DIAGNOSIS — T50902A Poisoning by unspecified drugs, medicaments and biological substances, intentional self-harm, initial encounter: Secondary | ICD-10-CM | POA: Diagnosis present

## 2019-05-13 DIAGNOSIS — T50902D Poisoning by unspecified drugs, medicaments and biological substances, intentional self-harm, subsequent encounter: Secondary | ICD-10-CM

## 2019-05-13 DIAGNOSIS — J9601 Acute respiratory failure with hypoxia: Secondary | ICD-10-CM

## 2019-05-13 DIAGNOSIS — T50902S Poisoning by unspecified drugs, medicaments and biological substances, intentional self-harm, sequela: Secondary | ICD-10-CM

## 2019-05-13 DIAGNOSIS — J690 Pneumonitis due to inhalation of food and vomit: Secondary | ICD-10-CM

## 2019-05-13 DIAGNOSIS — F1510 Other stimulant abuse, uncomplicated: Secondary | ICD-10-CM

## 2019-05-13 LAB — GLUCOSE, CAPILLARY
Glucose-Capillary: 61 mg/dL — ABNORMAL LOW (ref 70–99)
Glucose-Capillary: 139 mg/dL — ABNORMAL HIGH (ref 70–99)

## 2019-05-13 LAB — BLOOD GAS, ARTERIAL
Acid-base deficit: 6.6 mmol/L — ABNORMAL HIGH (ref 0.0–2.0)
Bicarbonate: 18.7 mmol/L — ABNORMAL LOW (ref 20.0–28.0)
FIO2: 100
O2 Saturation: 98.7 %
Patient temperature: 37
pCO2 arterial: 42.5 mmHg (ref 32.0–48.0)
pH, Arterial: 7.274 — ABNORMAL LOW (ref 7.350–7.450)
pO2, Arterial: 212 mmHg — ABNORMAL HIGH (ref 83.0–108.0)

## 2019-05-13 LAB — CBC
HCT: 41.9 % (ref 39.0–52.0)
Hemoglobin: 13.8 g/dL (ref 13.0–17.0)
MCH: 29.7 pg (ref 26.0–34.0)
MCHC: 32.9 g/dL (ref 30.0–36.0)
MCV: 90.1 fL (ref 80.0–100.0)
Platelets: 188 10*3/uL (ref 150–400)
RBC: 4.65 MIL/uL (ref 4.22–5.81)
RDW: 12.5 % (ref 11.5–15.5)
WBC: 7.8 10*3/uL (ref 4.0–10.5)
nRBC: 0 % (ref 0.0–0.2)

## 2019-05-13 LAB — BASIC METABOLIC PANEL
Anion gap: 4 — ABNORMAL LOW (ref 5–15)
BUN: 10 mg/dL (ref 6–20)
CO2: 22 mmol/L (ref 22–32)
Calcium: 7.9 mg/dL — ABNORMAL LOW (ref 8.9–10.3)
Chloride: 111 mmol/L (ref 98–111)
Creatinine, Ser: 1.09 mg/dL (ref 0.61–1.24)
GFR calc Af Amer: >60 mL/min (ref >60)
GFR calc non Af Amer: >60 mL/min (ref >60)
Glucose, Bld: 114 mg/dL — ABNORMAL HIGH (ref 70–99)
Potassium: 4.9 mmol/L (ref 3.5–5.1)
Sodium: 137 mmol/L (ref 135–145)

## 2019-05-13 LAB — RAPID URINE DRUG SCREEN, HOSP PERFORMED
Amphetamines: POSITIVE — AB
Barbiturates: NOT DETECTED
Benzodiazepines: NOT DETECTED
Cocaine: NOT DETECTED
Opiates: NOT DETECTED
Tetrahydrocannabinol: NOT DETECTED

## 2019-05-13 LAB — MRSA PCR SCREENING: MRSA by PCR: NEGATIVE

## 2019-05-13 LAB — CREATININE, SERUM
Creatinine, Ser: 1.04 mg/dL (ref 0.61–1.24)
GFR calc Af Amer: >60 mL/min (ref >60)
GFR calc non Af Amer: >60 mL/min (ref >60)

## 2019-05-13 LAB — LACTIC ACID, PLASMA
Lactic Acid, Venous: 0.9 mmol/L (ref 0.5–1.9)
Lactic Acid, Venous: 1.1 mmol/L (ref 0.5–1.9)

## 2019-05-13 LAB — CBG MONITORING, ED: Glucose-Capillary: 103 mg/dL — ABNORMAL HIGH (ref 70–99)

## 2019-05-13 LAB — TRIGLYCERIDES: Triglycerides: 76 mg/dL (ref <150)

## 2019-05-13 LAB — CK: Total CK: 2034 U/L — ABNORMAL HIGH (ref 49–397)

## 2019-05-13 LAB — SARS CORONAVIRUS 2 BY RT PCR (HOSPITAL ORDER, PERFORMED IN ~~LOC~~ HOSPITAL LAB): SARS Coronavirus 2: NEGATIVE

## 2019-05-13 MED ORDER — CHLORHEXIDINE GLUCONATE 0.12% ORAL RINSE (MEDLINE KIT)
15.0000 mL | Freq: Two times a day (BID) | OROMUCOSAL | Status: DC
  Administered 2019-05-13 (×2): 15 mL via OROMUCOSAL

## 2019-05-13 MED ORDER — LORAZEPAM 2 MG/ML IJ SOLN
1.0000 mg | Freq: Once | INTRAMUSCULAR | Status: DC
  Filled 2019-05-13: qty 1

## 2019-05-13 MED ORDER — PROPOFOL 1000 MG/100ML IV EMUL
5.0000 ug/kg/min | INTRAVENOUS | Status: DC
  Administered 2019-05-13: 40 ug/kg/min via INTRAVENOUS

## 2019-05-13 MED ORDER — SUCCINYLCHOLINE CHLORIDE 20 MG/ML IJ SOLN
125.0000 mg | Freq: Once | INTRAMUSCULAR | Status: AC
  Administered 2019-05-13: 125 mg via INTRAVENOUS

## 2019-05-13 MED ORDER — DEXTROSE 50 % IV SOLN
INTRAVENOUS | Status: AC
  Administered 2019-05-13: 50 mL
  Filled 2019-05-13: qty 50

## 2019-05-13 MED ORDER — ACETAMINOPHEN 650 MG RE SUPP: 650.0000 mg | Freq: Four times a day (QID) | RECTAL | Status: DC | PRN

## 2019-05-13 MED ORDER — ONDANSETRON HCL 4 MG/2ML IJ SOLN: 4.0000 mg | Freq: Four times a day (QID) | INTRAMUSCULAR | Status: DC | PRN

## 2019-05-13 MED ORDER — LORAZEPAM 2 MG/ML IJ SOLN
1.0000 mg | Freq: Once | INTRAMUSCULAR | Status: AC
  Administered 2019-05-13: 1 mg via INTRAVENOUS

## 2019-05-13 MED ORDER — FENTANYL CITRATE (PF) 100 MCG/2ML IJ SOLN: 50.0000 ug | INTRAMUSCULAR | Status: DC | PRN

## 2019-05-13 MED ORDER — ENOXAPARIN SODIUM 40 MG/0.4ML ~~LOC~~ SOLN
40.0000 mg | SUBCUTANEOUS | Status: DC
  Administered 2019-05-13 – 2019-05-14 (×2): 40 mg via SUBCUTANEOUS
  Filled 2019-05-13 (×2): qty 0.4

## 2019-05-13 MED ORDER — ONDANSETRON HCL 4 MG PO TABS: 4.0000 mg | ORAL_TABLET | Freq: Four times a day (QID) | ORAL | Status: DC | PRN

## 2019-05-13 MED ORDER — PIPERACILLIN-TAZOBACTAM 3.375 G IVPB
3.3750 g | Freq: Three times a day (TID) | INTRAVENOUS | Status: DC
  Administered 2019-05-13 – 2019-05-14 (×3): 3.375 g via INTRAVENOUS
  Filled 2019-05-13 (×3): qty 50

## 2019-05-13 MED ORDER — IPRATROPIUM-ALBUTEROL 0.5-2.5 (3) MG/3ML IN SOLN: 3.0000 mL | Freq: Four times a day (QID) | RESPIRATORY_TRACT | Status: DC | PRN

## 2019-05-13 MED ORDER — PROPOFOL 1000 MG/100ML IV EMUL
INTRAVENOUS | Status: AC
  Administered 2019-05-13: 04:00:00
  Filled 2019-05-13: qty 100

## 2019-05-13 MED ORDER — ETOMIDATE 2 MG/ML IV SOLN
20.0000 mg | Freq: Once | INTRAVENOUS | Status: AC
  Administered 2019-05-13: 20 mg via INTRAVENOUS

## 2019-05-13 MED ORDER — ORAL CARE MOUTH RINSE
15.0000 mL | OROMUCOSAL | Status: DC
  Administered 2019-05-13 – 2019-05-14 (×10): 15 mL via OROMUCOSAL

## 2019-05-13 MED ORDER — CHLORHEXIDINE GLUCONATE CLOTH 2 % EX PADS
6.0000 | MEDICATED_PAD | Freq: Every day | CUTANEOUS | Status: DC
  Administered 2019-05-13 – 2019-05-16 (×3): 6 via TOPICAL

## 2019-05-13 MED ORDER — PANTOPRAZOLE SODIUM 40 MG IV SOLR
40.0000 mg | INTRAVENOUS | Status: DC
  Administered 2019-05-13 – 2019-05-14 (×2): 40 mg via INTRAVENOUS
  Filled 2019-05-13 (×2): qty 40

## 2019-05-13 MED ORDER — SODIUM CHLORIDE 0.9 % IV SOLN
INTRAVENOUS | Status: DC
  Administered 2019-05-13 (×3): via INTRAVENOUS

## 2019-05-13 MED ORDER — PROPOFOL 1000 MG/100ML IV EMUL
0.0000 ug/kg/min | INTRAVENOUS | Status: DC
  Administered 2019-05-13: 30 ug/kg/min via INTRAVENOUS
  Administered 2019-05-13 – 2019-05-14 (×3): 40 ug/kg/min via INTRAVENOUS
  Filled 2019-05-13 (×4): qty 100

## 2019-05-13 MED ORDER — MIDAZOLAM HCL 2 MG/2ML IJ SOLN
2.0000 mg | INTRAMUSCULAR | Status: DC | PRN
  Administered 2019-05-14: 2 mg via INTRAVENOUS
  Filled 2019-05-13: qty 2

## 2019-05-13 MED ORDER — LORAZEPAM 2 MG/ML IJ SOLN: 1.0000 mg | Freq: Once | INTRAMUSCULAR | Status: DC

## 2019-05-13 MED ORDER — ACETAMINOPHEN 325 MG PO TABS: 650.0000 mg | ORAL_TABLET | Freq: Four times a day (QID) | ORAL | Status: DC | PRN

## 2019-05-13 MED ORDER — MIDAZOLAM HCL 2 MG/2ML IJ SOLN: 2.0000 mg | INTRAMUSCULAR | Status: DC | PRN

## 2019-05-13 NOTE — ED Provider Notes (Signed)
Patient continues to be struggling with his restraints.  He is talking without making any sense.  He was given an additional milligram of Ativan.   Rolland Porter, MD 05/13/19 [id number]

## 2019-05-13 NOTE — H&P (Signed)
TRH H&P    Patient Demographics:    Michael [name], is a 38 y.o. male  MRN: [id number]  DOB - 11/27/1980  Admit Date - 05/12/2019  Referring MD/NP/PA: Dr. Lynelle DoctorKnapp  Outpatient Primary MD for the patient is Patient, No Pcp Per  Patient coming from: Home  Chief complaint-intentional overdose   HPI:    Michael [name]  is a 38 y.o. male, with history of polysubstance abuse was brought to the ED via EMS for agitation.  Patient called 911 with concern for possible heroin overdose.  History is obtained from the ED notes as patient is currently intubated and sedated and unable to provide any history. Patient has been been abusing heroin and crystal meth.  Patient was brought to the ED by EMS.  In the ED he told nursing staff that he just wants to die.  Later patient became agitated, had sonorous respirations, patient became tachypneic and hypoxic with O2 sats in high 80s.  Patient was intubated to protect her airway. Patient will need psychiatric evaluation when he is extubated for suicidal ideation.    Review of systems:    As in HPI, rest of review of systems is unobtainable as patient is intubated and sedated.    Past History of the following :    Past Medical History:  Diagnosis Date  . Polysubstance abuse (HCC)   . Sciatica   . Shoulder dislocation       Past Surgical History:  Procedure Laterality Date  . ELBOW FRACTURE SURGERY        Social History:      Social History   Tobacco Use  . Smoking status: Current Every Day Smoker    Packs/day: 1.00    Types: Cigarettes  . Smokeless tobacco: Never Used  . Tobacco comment: Patient refused smoking cessation education  Substance Use Topics  . Alcohol use: Yes    Comment: "rarely"       Family History :   Unobtainable, patient is intubated   Home Medications:   Prior to Admission medications   Medication Sig Start Date End Date Taking?  Authorizing Provider  gabapentin (NEURONTIN) 100 MG capsule Take 1 capsule (100 mg total) by mouth 3 (three) times daily. 09/10/18   Micheal Likensainville, Christopher T, MD  hydrOXYzine (ATARAX/VISTARIL) 25 MG tablet Take 1 tablet (25 mg total) by mouth every 6 (six) hours as needed for anxiety. 09/10/18   Micheal Likensainville, Christopher T, MD  pantoprazole (PROTONIX) 40 MG tablet Take 1 tablet (40 mg total) by mouth daily. 09/10/18   Micheal Likensainville, Christopher T, MD  sertraline (ZOLOFT) 50 MG tablet Take 1 tablet (50 mg total) by mouth daily. 09/11/18   Micheal Likensainville, Christopher T, MD  traZODone (DESYREL) 50 MG tablet Take 1 tablet (50 mg total) by mouth at bedtime as needed for sleep. 09/10/18   Micheal Likensainville, Christopher T, MD     Allergies:    No Known Allergies   Physical Exam:   Vitals  Blood pressure 104/65, pulse 85, temperature 98.6 F (37 C), resp. rate 18, height 5\' 7"  (  1.702 m), SpO2 100 %.  1.  General: Sedated  2. Psychiatric: Not obtainable  3. Neurologic: Pinpoint pupils, moving all extremities  4. HEENMT:  Atraumatic normocephalic, ET tube in place  5. Respiratory : Clear to auscultation bilaterally  6. Cardiovascular : S1, S2, regular  7. Gastrointestinal:  Abdomen is soft, nontender, no organomegaly      Data Review:    CBC Recent Labs  Lab 05/12/19 2155  WBC 10.6*  HGB 14.0  HCT 40.2  PLT 291  MCV 84.8  MCH 29.5  MCHC 34.8  RDW 11.9  LYMPHSABS 2.0  MONOABS 1.0  EOSABS 0.0  BASOSABS 0.1   ------------------------------------------------------------------------------------------------------------------  Results for orders placed or performed during the hospital encounter of 05/12/19 (from the past 48 hour(s))  Comprehensive metabolic panel     Status: Abnormal   Collection Time: 05/12/19  9:55 PM  Result Value Ref Range   Sodium 130 (L) 135 - 145 mmol/L   Potassium 2.9 (L) 3.5 - 5.1 mmol/L   Chloride 100 98 - 111 mmol/L   CO2 18 (L) 22 - 32 mmol/L   Glucose,  Bld 92 70 - 99 mg/dL   BUN 15 6 - 20 mg/dL   Creatinine, Ser 1.61 (H) 0.61 - 1.24 mg/dL   Calcium 9.4 8.9 - 09.6 mg/dL   Total Protein 7.8 6.5 - 8.1 g/dL   Albumin 4.5 3.5 - 5.0 g/dL   AST 32 15 - 41 U/L   ALT 20 0 - 44 U/L   Alkaline Phosphatase 68 38 - 126 U/L   Total Bilirubin 1.1 0.3 - 1.2 mg/dL   GFR calc non Af Amer >60 >60 mL/min   GFR calc Af Amer >60 >60 mL/min   Anion gap 12 5 - 15    Comment: Performed at [facility], [address], [place], [place] [address]  Lipase, blood     Status: None   Collection Time: 05/12/19  9:55 PM  Result Value Ref Range   Lipase 24 11 - 51 U/L    Comment: Performed at [facility] [address], [place], [place] [address]  CBC with Differential     Status: Abnormal   Collection Time: 05/12/19  9:55 PM  Result Value Ref Range   WBC 10.6 (H) 4.0 - 10.5 K/uL   RBC 4.74 4.22 - 5.81 MIL/uL   Hemoglobin 14.0 13.0 - 17.0 g/dL   HCT 14.7 82.9 - 56.2 %   MCV 84.8 80.0 - 100.0 fL   MCH 29.5 26.0 - 34.0 pg   MCHC 34.8 30.0 - 36.0 g/dL   RDW 13.0 86.5 - 78.4 %   Platelets 291 150 - 400 K/uL   nRBC 0.0 0.0 - 0.2 %   Neutrophils Relative % 70 %   Neutro Abs 7.5 1.7 - 7.7 K/uL   Lymphocytes Relative 19 %   Lymphs Abs 2.0 0.7 - 4.0 K/uL   Monocytes Relative 10 %   Monocytes Absolute 1.0 0.1 - 1.0 K/uL   Eosinophils Relative 0 %   Eosinophils Absolute 0.0 0.0 - 0.5 K/uL   Basophils Relative 1 %   Basophils Absolute 0.1 0.0 - 0.1 K/uL   Immature Granulocytes 0 %   Abs Immature Granulocytes 0.03 0.00 - 0.07 K/uL    Comment: Performed at [facility], [address], [place], [place] [address]  CK     Status: Abnormal   Collection Time: 05/12/19  9:55 PM  Result Value Ref Range   Total CK  875 (H) 49 - 397 U/L    Comment: Performed at [facility], [address] [place], [place] [address]  Ethanol     Status: None   Collection Time: 05/12/19  9:55 PM  Result Value Ref Range   Alcohol, Ethyl (B) <10 <10 mg/dL    Comment: (NOTE) Lowest  detectable limit for serum alcohol is 10 mg/dL. For medical purposes only. Performed at [facility], [address] [place], [place] [address]   Acetaminophen level     Status: Abnormal   Collection Time: 05/12/19  9:55 PM  Result Value Ref Range   Acetaminophen (Tylenol), Serum <10 (L) 10 - 30 ug/mL    Comment: (NOTE) Therapeutic concentrations vary significantly. A range of 10-30 ug/mL  may be an effective concentration for many patients. However, some  are best treated at concentrations outside of this range. Acetaminophen concentrations >150 ug/mL at 4 hours after ingestion  and >50 ug/mL at 12 hours after ingestion are often associated with  toxic reactions. Performed at [facility], [address] [place], [place] [address]   Salicylate level     Status: None   Collection Time: 05/12/19  9:55 PM  Result Value Ref Range   Salicylate Lvl <7.0 2.8 - 30.0 mg/dL    Comment: Performed at [facility], [address] [place], [place] [address]  CBG monitoring, ED     Status: Abnormal   Collection Time: 05/13/19  1:18 AM  Result Value Ref Range   Glucose-Capillary 103 (H) 70 - 99 mg/dL  SARS Coronavirus 2 (CEPHEID - Performed in [facility] hospital lab), Hosp Order     Status: None   Collection Time: 05/13/19  4:02 AM   Specimen: Nasopharyngeal Swab  Result Value Ref Range   SARS Coronavirus 2 NEGATIVE NEGATIVE    Comment: (NOTE) If result is NEGATIVE SARS-CoV-2 target nucleic acids are NOT DETECTED. The SARS-CoV-2 RNA is generally detectable in upper and lower  respiratory specimens during the acute phase of infection. The lowest  concentration of SARS-CoV-2 viral copies this assay can detect is 250  copies / mL. A negative result does not preclude SARS-CoV-2 infection  and should not be used as the sole basis for treatment or other  patient management decisions.  A negative result may occur with  improper specimen collection / handling, submission of specimen other  than  nasopharyngeal swab, presence of viral mutation(s) within the  areas targeted by this assay, and inadequate number of viral copies  (<250 copies / mL). A negative result must be combined with clinical  observations, patient history, and epidemiological information. If result is POSITIVE SARS-CoV-2 target nucleic acids are DETECTED. The SARS-CoV-2 RNA is generally detectable in upper and lower  respiratory specimens dur ing the acute phase of infection.  Positive  results are indicative of active infection with SARS-CoV-2.  Clinical  correlation with patient history and other diagnostic information is  necessary to determine patient infection status.  Positive results do  not rule out bacterial infection or co-infection with other viruses. If result is PRESUMPTIVE POSTIVE SARS-CoV-2 nucleic acids MAY BE PRESENT.   A presumptive positive result was obtained on the submitted specimen  and confirmed on repeat testing.  While 2019 novel coronavirus  (SARS-CoV-2) nucleic acids may be present in the submitted sample  additional confirmatory testing may be necessary for epidemiological  and / or clinical management purposes  to differentiate between  SARS-CoV-2 and other Sarbecovirus currently known to infect humans.  If  clinically indicated additional testing with an alternate test  methodology [phone]) is advised. The SARS-CoV-2 RNA is generally  detectable in upper and lower respiratory sp ecimens during the acute  phase of infection. The expected result is Negative. Fact Sheet for Patients:  StrictlyIdeas.no Fact Sheet for Healthcare Providers: BankingDealers.co.za This test is not yet approved or cleared by the [place] FDA and has been authorized for detection and/or diagnosis of SARS-CoV-2 by FDA under an Emergency Use Authorization (EUA).  This EUA will remain in effect (meaning this test can be used) for the duration of the  COVID-19 declaration under Section 564(b)(1) of the Act, 21 U.S.C. section 360bbb-3(b)(1), unless the authorization is terminated or revoked sooner. Performed at [facility], [address], [place], [place] [address]   Urine rapid drug screen (hosp performed)     Status: Abnormal   Collection Time: 05/13/19  4:03 AM  Result Value Ref Range   Opiates NONE DETECTED NONE DETECTED   Cocaine NONE DETECTED NONE DETECTED   Benzodiazepines NONE DETECTED NONE DETECTED   Amphetamines POSITIVE (A) NONE DETECTED   Tetrahydrocannabinol NONE DETECTED NONE DETECTED   Barbiturates NONE DETECTED NONE DETECTED    Comment: (NOTE) DRUG SCREEN FOR MEDICAL PURPOSES ONLY.  IF CONFIRMATION IS NEEDED FOR ANY PURPOSE, NOTIFY LAB WITHIN 5 DAYS. LOWEST DETECTABLE LIMITS FOR URINE DRUG SCREEN Drug Class                     Cutoff (ng/mL) Amphetamine and metabolites    1000 Barbiturate and metabolites    200 Benzodiazepine                 601 Tricyclics and metabolites     300 Opiates and metabolites        300 Cocaine and metabolites        300 THC                            50 Performed at [facility], [address], [place], [place] [address]   Blood gas, arterial     Status: Abnormal   Collection Time: 05/13/19  4:40 AM  Result Value Ref Range   FIO2 100.00    pH, Arterial 7.274 (L) 7.350 - 7.450   pCO2 arterial 42.5 32.0 - 48.0 mmHg   pO2, Arterial 212 (H) 83.0 - 108.0 mmHg   Bicarbonate 18.7 (L) 20.0 - 28.0 mmol/L   Acid-base deficit 6.6 (H) 0.0 - 2.0 mmol/L   O2 Saturation 98.7 %   Patient temperature 37.0    Allens test (pass/fail) PASS PASS    Comment: Performed at [facility], [address] [place], [place] [address]  Lactic acid, plasma     Status: None   Collection Time: 05/13/19  5:17 AM  Result Value Ref Range   Lactic Acid, Venous 0.9 0.5 - 1.9 mmol/L    Comment: Performed at [facility], [address], [place], [place] [address]  CK     Status: Abnormal   Collection Time:  05/13/19  5:17 AM  Result Value Ref Range   Total CK 2,034 (H) 49 - 397 U/L    Comment: Performed at [facility], [address], [place], [place] [address]    Chemistries  Recent Labs  Lab 05/12/19 2155  NA 130*  K 2.9*  CL 100  CO2 18*  GLUCOSE 92  BUN 15  CREATININE 1.40*  CALCIUM 9.4  AST  32  ALT 20  ALKPHOS 68  BILITOT 1.1   ------------------------------------------------------------------------------------------------------------------  ------------------------------------------------------------------------------------------------------------------ GFR: CrCl cannot be calculated (Unknown ideal weight.). Liver Function Tests: Recent Labs  Lab 05/12/19 2155  AST 32  ALT 20  ALKPHOS 68  BILITOT 1.1  PROT 7.8  ALBUMIN 4.5   Recent Labs  Lab 05/12/19 2155  LIPASE 24   No results for input(s): AMMONIA in the last 168 hours. Coagulation Profile: No results for input(s): INR, PROTIME in the last 168 hours. Cardiac Enzymes: Recent Labs  Lab 05/12/19 2155 05/13/19 0517  CKTOTAL 875* 2,034*   BNP (last 3 results) No results for input(s): PROBNP in the last 8760 hours. HbA1C: No results for input(s): HGBA1C in the last 72 hours. CBG: Recent Labs  Lab 05/13/19 0118  GLUCAP 103*   Lipid Profile: No results for input(s): CHOL, HDL, LDLCALC, TRIG, CHOLHDL, LDLDIRECT in the last 72 hours. Thyroid Function Tests: No results for input(s): TSH, T4TOTAL, FREET4, T3FREE, THYROIDAB in the last 72 hours. Anemia Panel: No results for input(s): VITAMINB12, FOLATE, FERRITIN, TIBC, IRON, RETICCTPCT in the last 72 hours.  --------------------------------------------------------------------------------------------------------------- Urine analysis:    Component Value Date/Time   COLORURINE YELLOW 01/21/2016 1143   APPEARANCEUR CLEAR 01/21/2016 1143   LABSPEC 1.015 01/21/2016 1143   PHURINE 6.0 01/21/2016 1143   GLUCOSEU NEGATIVE 01/21/2016 1143   HGBUR  NEGATIVE 01/21/2016 1143   BILIRUBINUR NEGATIVE 01/21/2016 1143   KETONESUR NEGATIVE 01/21/2016 1143   PROTEINUR NEGATIVE 01/21/2016 1143   NITRITE NEGATIVE 01/21/2016 1143   LEUKOCYTESUR NEGATIVE 01/21/2016 1143      Imaging Results:    Dg Chest Port 1 View  Result Date: 05/13/2019 CLINICAL DATA:  Intubation. Possible overdose. EXAM: PORTABLE CHEST 1 VIEW COMPARISON:  09/03/2018 FINDINGS: Endotracheal tube tip 4.8 cm from the carina. Enteric tube in place with tip and side-port below the diaphragm in the stomach. Heterogeneous bilateral suprahilar opacities. Heart is normal in size. No large pleural effusion or pneumothorax. No acute osseous abnormalities are seen. IMPRESSION: 1. Endotracheal tube tip 4.8 cm from the carina. Enteric tube in place, tip and side-port below the diaphragm. 2. Heterogeneous bilateral suprahilar opacities may be aspiration or atelectasis. Pulmonary edema felt less likely. Electronically Signed   By: Narda Rutherford M.D.   On: 05/13/2019 03:59    My personal review of EKG: Rhythm NSR   Assessment & Plan:    Active Problems:   Drug overdose, intentional (HCC)   1. Intentional drug overdose-patient presented with overdose of heroin, crystal meth.  Patient was alert and oriented x3 at the time of presentation.  Later became agitated and required to be intubated to protect the airway.  He told the nursing staff that he just wanted to die.  Patient will need psychiatric evaluation once he is extubated.  Will consult pulmonology for vent management.  2. Acute respiratory failure-secondary to above, patient intubated to protect the airway.  Chest x-ray shows atelectasis versus infiltrate.  Does not appear to be septic.  Will monitor.  3. Rhabdomyolysis-patient has elevated CK, which went up from [phone].  Last start normal saline at 125 mL/h.  Check CK in a.m.  4. Hypokalemia-patient potassium was 2.9 yesterday.  Potassium was replaced in the ED yesterday.  Will  repeat BMP this a.m.  5. Acute kidney injury-patient's baseline creatinine is 0.86 as of November 2019.  Today presented with creatinine 1.40.  Started IV normal saline as above.  Likely from poor p.o. intake and dehydration.  Follow renal function  in a.m.    DVT Prophylaxis-   Lovenox   AM Labs Ordered, also please review Full Orders  Family Communication: Admission, patients condition and plan of care including tests being ordered have been discussed with the patient  who indicate understanding and agree with the plan and Code Status.  Code Status: Full code  Admission status: Inpatient: Based on patients clinical presentation and evaluation of above clinical data, I have made determination that patient meets Inpatient criteria at this time.  Time spent in minutes : 60 minutes   Meredeth IdeGagan S Lama M.D on 05/13/2019 at 6:03 AM

## 2019-05-13 NOTE — Progress Notes (Signed)
Patient seen and examined. Admitted after midnight secondary to intentional drug overdose, AKI and acute resp failure with hypoxia. Patient required intubation for airway protection. VS currently and will start weaning FIO2 process. Please refer to H&P written by Dr. Darrick Meigs for further info/details on admission.   Plan -Cr improved with IVF's -electrolytes repleted and WNL now -follow BMET -wean ventilatory support and perform SBT when appropriate -fever appreciated this morning; aspiration high in differential; will check blood culture, urine culture and start patient on zosyn. -repeat CXR in am   Barton Dubois MD [phone]

## 2019-05-13 NOTE — ED Notes (Signed)
Pt's O2 sats decreased to mid 80's to low 90's; Dr. Tomi Bamberger informed and order given to place pt on NRB; pt placed on NRB and sats continue to decrease to upper 80's with pt still thrashing around in the bed

## 2019-05-13 NOTE — Progress Notes (Signed)
Pt transported from ED to ICU on ventilator.  RT will continue to monitor.

## 2019-05-13 NOTE — ED Notes (Signed)
Pt diaphoretic and very restless. Pt pulled iv's out from thrashing in bed. Dr Tomi Bamberger notified.

## 2019-05-13 NOTE — ED Provider Notes (Signed)
Patient was noted to be agitated however he was having a lot of sonorous respirations.  About 30 minutes before we moved him to another room he started getting hypoxic in the high 80s.  A nonrebreather mask was placed.  He sounded like his tongue was obstructing his breathing.  We attempted putting in a nasal trumpet which caused a nosebleed.  He was still having some upper obstructive breathing and decision was made to move patient to critical care bed for intubation.  Patient has the appearance of a sympathetic toxidrome with diaphoresis, tachycardia, hypertension, small pupils.  His pupils were equal bilaterally.  3:35 AM patient was intubated to protect his airway and to correct his hypoxia.  Portable chest ray done after intubation shows possible aspiration versus atelectasis.  Patient currently does not have a fever, he was not started on antibiotics.  COVID testing was done.  Patient was discussed with Dr. Sharl MaLama, hospitalist at 5:33 AM.  He wants to talk to the ICU to see if Dr. Juanetta GoslingHawkins, her only pulmonologist is in town this week.  5:42 AM Dr. Sharl MaLama, hospitalist states he will admit.  INTUBATION Performed by: Ward GivensIva L Esthela Brandner  Required items: required blood products, implants, devices, and special equipment available Patient identity confirmed: provided demographic data and hospital-assigned identification number Time out: Immediately prior to procedure a "time out" was called to verify the correct patient, procedure, equipment, support staff and site/side marked as required.  Indications: AMS  Intubation method: Glidescope Laryngoscopy   Preoxygenation: 100 %BVM  Sedatives: 20 mg Etomidate Paralytic: 150 mg Succinylcholine  Tube Size: 8.0 cuffed with subglottic cuff  Patient had a lot of bloody foamy bubbles coming out of his airway.  Post-procedure assessment: chest rise and ETCO2 monitor Breath sounds: equal and absent over the epigastrium Tube secured with: ETT holder Chest  x-ray interpreted by radiologist and me.  Chest x-ray findings: endotracheal tube in appropriate position  Patient tolerated the procedure well with no immediate complications.  Results for orders placed or performed during the hospital encounter of 05/12/19  SARS Coronavirus 2 (CEPHEID - [facility]   Specimen: Nasopharyngeal Swab  Result Value Ref Range   SARS Coronavirus 2 NEGATIVE NEGATIVE  Comprehensive metabolic panel  Result Value Ref Range   Sodium 130 (L) 135 - 145 mmol/L   Potassium 2.9 (L) 3.5 - 5.1 mmol/L   Chloride 100 98 - 111 mmol/L   CO2 18 (L) 22 - 32 mmol/L   Glucose, Bld 92 70 - 99 mg/dL   BUN 15 6 - 20 mg/dL   Creatinine, Ser 6.961.40 (H) 0.61 - 1.24 mg/dL   Calcium 9.4 8.9 - 29.510.3 mg/dL   Total Protein 7.8 6.5 - 8.1 g/dL   Albumin 4.5 3.5 - 5.0 g/dL   AST 32 15 - 41 U/L   ALT 20 0 - 44 U/L   Alkaline Phosphatase 68 38 - 126 U/L   Total Bilirubin 1.1 0.3 - 1.2 mg/dL   GFR calc non Af Amer >60 >60 mL/min   GFR calc Af Amer >60 >60 mL/min   Anion gap 12 5 - 15  Lipase, blood  Result Value Ref Range   Lipase 24 11 - 51 U/L  CBC with Differential  Result Value Ref Range   WBC 10.6 (H) 4.0 - 10.5 K/uL   RBC 4.74 4.22 - 5.81 MIL/uL   Hemoglobin 14.0 13.0 - 17.0 g/dL   HCT 28.440.2 13.239.0 - 44.052.0 %  MCV 84.8 80.0 - 100.0 fL   MCH 29.5 26.0 - 34.0 pg   MCHC 34.8 30.0 - 36.0 g/dL   RDW 45.411.9 09.811.5 - 11.915.5 %   Platelets 291 150 - 400 K/uL   nRBC 0.0 0.0 - 0.2 %   Neutrophils Relative % 70 %   Neutro Abs 7.5 1.7 - 7.7 K/uL   Lymphocytes Relative 19 %   Lymphs Abs 2.0 0.7 - 4.0 K/uL   Monocytes Relative 10 %   Monocytes Absolute 1.0 0.1 - 1.0 K/uL   Eosinophils Relative 0 %   Eosinophils Absolute 0.0 0.0 - 0.5 K/uL   Basophils Relative 1 %   Basophils Absolute 0.1 0.0 - 0.1 K/uL   Immature Granulocytes 0 %   Abs Immature Granulocytes 0.03 0.00 - 0.07 K/uL  CK  Result Value Ref Range   Total CK 875 (H) 49 - 397 U/L  Ethanol   Result Value Ref Range   Alcohol, Ethyl (B) <10 <10 mg/dL  Urine rapid drug screen (hosp performed)  Result Value Ref Range   Opiates NONE DETECTED NONE DETECTED   Cocaine NONE DETECTED NONE DETECTED   Benzodiazepines NONE DETECTED NONE DETECTED   Amphetamines POSITIVE (A) NONE DETECTED   Tetrahydrocannabinol NONE DETECTED NONE DETECTED   Barbiturates NONE DETECTED NONE DETECTED  Acetaminophen level  Result Value Ref Range   Acetaminophen (Tylenol), Serum <10 (L) 10 - 30 ug/mL  Salicylate level  Result Value Ref Range   Salicylate Lvl <7.0 2.8 - 30.0 mg/dL  Blood gas, arterial  Result Value Ref Range   FIO2 100.00    pH, Arterial 7.274 (L) 7.350 - 7.450   pCO2 arterial 42.5 32.0 - 48.0 mmHg   pO2, Arterial 212 (H) 83.0 - 108.0 mmHg   Bicarbonate 18.7 (L) 20.0 - 28.0 mmol/L   Acid-base deficit 6.6 (H) 0.0 - 2.0 mmol/L   O2 Saturation 98.7 %   Patient temperature 37.0    Allens test (pass/fail) PASS PASS  CBG monitoring, ED  Result Value Ref Range   Glucose-Capillary 103 (H) 70 - 99 mg/dL   Hyponatremia with mild renal insufficiency however patient received 3 L of normal saline after that blood was drawn.  He also has some metabolic acidosis without increased anion gap.  His CK is elevated but not significantly.  Repeat CK was added and lactic acid was added.  COVID testing was done.   Dg Chest Port 1 View  Result Date: 05/13/2019 CLINICAL DATA:  Intubation. Possible overdose. EXAM: PORTABLE CHEST 1 VIEW COMPARISON:  09/03/2018 FINDINGS: Endotracheal tube tip 4.8 cm from the carina. Enteric tube in place with tip and side-port below the diaphragm in the stomach. Heterogeneous bilateral suprahilar opacities. Heart is normal in size. No large pleural effusion or pneumothorax. No acute osseous abnormalities are seen. IMPRESSION: 1. Endotracheal tube tip 4.8 cm from the carina. Enteric tube in place, tip and side-port below the diaphragm. 2. Heterogeneous bilateral suprahilar  opacities may be aspiration or atelectasis. Pulmonary edema felt less likely. Electronically Signed   By: Narda RutherfordMelanie  Sanford M.D.   On: 05/13/2019 03:59    Diagnoses that have been ruled out:  None  Diagnoses that are still under consideration:  None  Final diagnoses:  Methamphetamine abuse (HCC)  Drug overdose, undetermined intent, initial encounter  Acute respiratory failure with hypoxia (HCC)   Plan admission  Devoria AlbeIva Darlene Bartelt, MD, FACEP   CRITICAL CARE Performed by: Amylia Collazos L Darrian Goodwill Total critical care time: 31 minutes Critical care time  was exclusive of separately billable procedures and treating other patients. Critical care was necessary to treat or prevent imminent or life-threatening deterioration. Critical care was time spent personally by me on the following activities: development of treatment plan with patient and/or surrogate as well as nursing, discussions with consultants, evaluation of patient's response to treatment, examination of patient, obtaining history from patient or surrogate, ordering and performing treatments and interventions, ordering and review of laboratory studies, ordering and review of radiographic studies, pulse oximetry and re-evaluation of patient's condition.     Rolland Porter, MD 05/13/19 [phone]

## 2019-05-13 NOTE — Progress Notes (Signed)
Hypoglycemic Event  CBG: 61   Treatment: Dextrose 50  Symptoms: n/a Follow-up CBG: Time:2040 CBG Result:139  Possible Reasons for Event: NPO  Comments/MD notified:na    Tim Lair

## 2019-05-13 NOTE — ED Notes (Signed)
OGT patent and to LIS, blood-tinged.

## 2019-05-14 ENCOUNTER — Inpatient Hospital Stay (HOSPITAL_COMMUNITY): Payer: Self-pay

## 2019-05-14 DIAGNOSIS — N179 Acute kidney failure, unspecified: Secondary | ICD-10-CM

## 2019-05-14 DIAGNOSIS — E876 Hypokalemia: Secondary | ICD-10-CM

## 2019-05-14 DIAGNOSIS — M6282 Rhabdomyolysis: Secondary | ICD-10-CM

## 2019-05-14 DIAGNOSIS — T50904A Poisoning by unspecified drugs, medicaments and biological substances, undetermined, initial encounter: Secondary | ICD-10-CM

## 2019-05-14 LAB — BLOOD GAS, ARTERIAL
Acid-base deficit: 2.6 mmol/L — ABNORMAL HIGH (ref 0.0–2.0)
Acid-base deficit: 2.8 mmol/L — ABNORMAL HIGH (ref 0.0–2.0)
Bicarbonate: 22.5 mmol/L (ref 20.0–28.0)
Bicarbonate: 22.4 mmol/L (ref 20.0–28.0)
Drawn by: 38235
FIO2: 30
FIO2: 30
MECHVT: 520 mL
O2 Saturation: 97.1 %
O2 Saturation: 97.6 %
PEEP: 5 cmH2O
Patient temperature: 37
Patient temperature: 37
RATE: 16 resp/min
pCO2 arterial: 35.8 mmHg (ref 32.0–48.0)
pCO2 arterial: 34.1 mmHg (ref 32.0–48.0)
pH, Arterial: 7.396 (ref 7.350–7.450)
pH, Arterial: 7.409 (ref 7.350–7.450)
pO2, Arterial: 91.5 mmHg (ref 83.0–108.0)
pO2, Arterial: 98.8 mmHg (ref 83.0–108.0)

## 2019-05-14 LAB — TRIGLYCERIDES: Triglycerides: 106 mg/dL (ref <150)

## 2019-05-14 LAB — CBC
HCT: 36.1 % — ABNORMAL LOW (ref 39.0–52.0)
Hemoglobin: 11.7 g/dL — ABNORMAL LOW (ref 13.0–17.0)
MCH: 29.5 pg (ref 26.0–34.0)
MCHC: 32.4 g/dL (ref 30.0–36.0)
MCV: 90.9 fL (ref 80.0–100.0)
Platelets: 179 10*3/uL (ref 150–400)
RBC: 3.97 MIL/uL — ABNORMAL LOW (ref 4.22–5.81)
RDW: 12.3 % (ref 11.5–15.5)
WBC: 6.8 10*3/uL (ref 4.0–10.5)
nRBC: 0 % (ref 0.0–0.2)

## 2019-05-14 LAB — COMPREHENSIVE METABOLIC PANEL
ALT: 13 U/L (ref 0–44)
AST: 20 U/L (ref 15–41)
Albumin: 2.6 g/dL — ABNORMAL LOW (ref 3.5–5.0)
Alkaline Phosphatase: 42 U/L (ref 38–126)
Anion gap: 6 (ref 5–15)
BUN: 8 mg/dL (ref 6–20)
CO2: 21 mmol/L — ABNORMAL LOW (ref 22–32)
Calcium: 7.5 mg/dL — ABNORMAL LOW (ref 8.9–10.3)
Chloride: 112 mmol/L — ABNORMAL HIGH (ref 98–111)
Creatinine, Ser: 1.03 mg/dL (ref 0.61–1.24)
GFR calc Af Amer: >60 mL/min (ref >60)
GFR calc non Af Amer: >60 mL/min (ref >60)
Glucose, Bld: 103 mg/dL — ABNORMAL HIGH (ref 70–99)
Potassium: 3.3 mmol/L — ABNORMAL LOW (ref 3.5–5.1)
Sodium: 139 mmol/L (ref 135–145)
Total Bilirubin: 0.8 mg/dL (ref 0.3–1.2)
Total Protein: 4.9 g/dL — ABNORMAL LOW (ref 6.5–8.1)

## 2019-05-14 LAB — GLUCOSE, CAPILLARY
Glucose-Capillary: 71 mg/dL (ref 70–99)
Glucose-Capillary: 92 mg/dL (ref 70–99)
Glucose-Capillary: 93 mg/dL (ref 70–99)
Glucose-Capillary: 94 mg/dL (ref 70–99)

## 2019-05-14 LAB — HIV ANTIBODY (ROUTINE TESTING W REFLEX): HIV Screen 4th Generation wRfx: NONREACTIVE

## 2019-05-14 MED ORDER — POTASSIUM CHLORIDE CRYS ER 20 MEQ PO TBCR
40.0000 meq | EXTENDED_RELEASE_TABLET | Freq: Once | ORAL | Status: AC
  Administered 2019-05-14: 40 meq via ORAL

## 2019-05-14 MED ORDER — DEXTROSE-NACL 5-0.9 % IV SOLN
INTRAVENOUS | Status: DC
  Administered 2019-05-14: 01:00:00 via INTRAVENOUS

## 2019-05-14 MED ORDER — SODIUM CHLORIDE 0.9 % IV SOLN
3.0000 g | Freq: Four times a day (QID) | INTRAVENOUS | Status: DC
  Administered 2019-05-14 – 2019-05-15 (×6): 3 g via INTRAVENOUS
  Filled 2019-05-14 (×2): qty 8
  Filled 2019-05-14: qty 3
  Filled 2019-05-14 (×7): qty 8
  Filled 2019-05-14: qty 3
  Filled 2019-05-14 (×3): qty 8

## 2019-05-14 MED ORDER — NICOTINE 21 MG/24HR TD PT24
21.0000 mg | MEDICATED_PATCH | Freq: Every day | TRANSDERMAL | Status: DC
  Administered 2019-05-14 – 2019-05-16 (×3): 21 mg via TRANSDERMAL
  Filled 2019-05-14 (×3): qty 1

## 2019-05-14 MED ORDER — PANTOPRAZOLE SODIUM 40 MG PO TBEC
40.0000 mg | DELAYED_RELEASE_TABLET | Freq: Every day | ORAL | Status: DC
  Administered 2019-05-14 – 2019-05-16 (×3): 40 mg via ORAL
  Filled 2019-05-14 (×3): qty 1

## 2019-05-14 MED ORDER — SODIUM CHLORIDE 0.9 % IV SOLN
INTRAVENOUS | Status: DC
  Administered 2019-05-14: 18:00:00 via INTRAVENOUS

## 2019-05-14 MED ORDER — FENTANYL CITRATE (PF) 100 MCG/2ML IJ SOLN
50.0000 ug | INTRAMUSCULAR | Status: DC | PRN
  Administered 2019-05-14 – 2019-05-15 (×2): 50 ug via INTRAVENOUS
  Filled 2019-05-14 (×2): qty 2

## 2019-05-14 NOTE — Progress Notes (Signed)
Dr. Silas [place] paged d/t pts blood sugar 71. RN requesting IVF to include dextrose to decrease chance of pts blood sugar dropping any lower. Waiting for call back/orders. Will continue to monitor pt.

## 2019-05-14 NOTE — Progress Notes (Signed)
Dr. Silas [place] paged to see if nicotine patch could be ordered for patient as well as Telesitter order renewed because it expires in 3 hours. Waiting for call back/orders.

## 2019-05-14 NOTE — Progress Notes (Addendum)
Pt meets inpatient criteria per Jefferson Fuel, NP. Referral information has been sent to the following hospitals for review:  [facility]  [facility]  [facility]  [facility]     Disposition will continue to assist with inpatient placement needs.   Audree Camel, LCSW, [place] Disposition [facility]S [phone]

## 2019-05-14 NOTE — Progress Notes (Signed)
Pt has only had 152mL urine output so far this shift through foley catheter. 300 total documented since 0900 this am. Urine is yellow, cloudy, with sediment. Dr. Darrick Meigs paged and made aware. Waiting for orders/call back. Will continue to monitor pt

## 2019-05-14 NOTE — BH Assessment (Signed)
Tele Assessment Note   Patient Name: Michael ArmsRobert Jackson Venuto [name] MRN: [id number] Referring Physician: Margarita GrizzleMedera, Carlos, MD Location of Patient: [facility] Location of Provider: Behavioral Health TTS Department  Michael [name] is an 38 y.o. male with a history of polysubstance abuse who was brought to the emergency department by EMS because of agitation. Patient reported,"I overdosed" but would not provide further information. Per note in chart, apparently, he had called 911 with concern that he had overdosed on heroin.  He was able to give some history stated that he had been abusing heroin and crystal meth when he was in the emergency department.  He told the nursing staff that he wanted to die. Patient denied suicidal / homicidal ideations during the assessment. Report he just overdosed and he was not trying to kill himself. Report he does not want substance abuse treatment and just wants to return home stating, "whatever I was going through I am over it now. I want to thank EMS for saving my life but I am ready to go home now." Denied auditory / visual hallucinations then asked, "why is my hand 4x the normal size." Patient uncooperative refusing to answer questions. Patient admitted to the hospital 08/2018 overdose. Information gather from previous visits. Patient slurred when he spoke and did not make eye contact.  Disposition: [name], NP, recommend inpatient hospitalizations.     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 Polysubstance abuse (HCC)   . Sciatica   . Shoulder dislocation     Past Surgical History:  Procedure Laterality Date  . ELBOW FRACTURE SURGERY      Family History: No family history on file.  Social History:  reports that he has been smoking cigarettes. He has been smoking about 1.00 pack per day.  He has never used smokeless tobacco. He reports current alcohol use. He reports current drug use. Drugs: IV and Benzodiazepines.  Additional Social History:  Alcohol / Drug Use Pain Medications: see MAR Prescriptions: see MAR Over the Counter: see MAR History of alcohol / drug use?: Yes Substance #1 Name of Substance 1: Crack-Cocaine 1 - Age of First Use: 21 1 - Amount (size/oz): unknown 1 - Frequency: daily 1 - Duration: 16 years 1 - Last Use / Amount: 05/14/2019 Substance #2 Name of Substance 2: Heroin 2 - Age of First Use: 35 2 - Amount (size/oz): unknown 2 - Frequency: unknown 2 - Duration: unknown 2 - Last Use / Amount: 05/17/2019 Substance #3 Name of Substance 3: Amphetamines 3 - Age of First Use: unknown 3 - Amount (size/oz): unknown 3 - Frequency: unknown 3 - Duration: unknown 3 - Last Use / Amount: unknown  CIWA: CIWA-Ar BP: (!) 143/83 Pulse Rate: 86 COWS:    Allergies: No Known Allergies  Home Medications:  No medications prior to admission.    OB/GYN Status:  No LMP for male patient.  General Assessment Data Location of Assessment: [facility] TTS Assessment: In system Is this a Tele or Face-to-Face Assessment?: Tele Assessment Is this an Initial Assessment or a Re-assessment for this encounter?: Initial Assessment Patient Accompanied by:: N/A Language Other than English: No Living Arrangements: Other (Comment)(lives with mother) What gender do you identify as?: Male Marital status: Single Maiden name: n/a Pregnancy Status: No Living Arrangements: Other (Comment)(report lives with his mother) Can pt return to current living arrangement?:  Yes Admission Status: Voluntary Is patient capable of signing voluntary admission?: Yes Referral Source: Self/Family/Friend Insurance type: self-pay      Crisis Care Plan Living Arrangements: Other (Comment)(report lives with his mother) Legal Guardian: Other:(self ) Name of Psychiatrist: none reported  Name of  Therapist: none reported   Education Status Is patient currently in school?: No Is the patient employed, unemployed or receiving disability?: Unemployed  Risk to self with the past 6 months Suicidal Ideation: No-Not Currently/Within Last 6 Months(pt denied feeling suicidal to TTS assessor, admitted to EMS ) Has patient been a risk to self within the past 6 months prior to admission? : No Suicidal Intent: No Has patient had any suicidal intent within the past 6 months prior to admission? : No Is patient at risk for suicide?: No Suicidal Plan?: No Has patient had any suicidal plan within the past 6 months prior to admission? : No Access to Means: No What has been your use of drugs/alcohol within the last 12 months?: Meth, Heroin, Crack Cocaine  Previous Attempts/Gestures: No How many times?: 0 Other Self Harm Risks: poly-substance use  Triggers for Past Attempts: None known Intentional Self Injurious Behavior: Damaging(poly-substance use ) Comment - Self Injurious Behavior: poly-substance use  Family Suicide History: Unknown Recent stressful life event(s): Other (Comment)(none reported ) Persecutory voices/beliefs?: No Depression: (none reported ) Depression Symptoms: (no symptoms reported ) Substance abuse history and/or treatment for substance abuse?: Yes Suicide prevention information given to non-admitted patients: Not applicable  Risk to Others within the past 6 months Homicidal Ideation: No Does patient have any lifetime risk of violence toward others beyond the six months prior to admission? : No Thoughts of Harm to Others: No Current Homicidal Intent: No Current Homicidal Plan: No Access to Homicidal Means: No Identified Victim: n/a History of harm to others?: No Assessment of Violence: None Noted Violent Behavior Description: None noted  Does patient have access to weapons?: No Criminal Charges Pending?: No Does patient have a court date: No Is patient on  probation?: No  Psychosis Hallucinations: None noted Delusions: None noted  Mental Status Report Appearance/Hygiene: In hospital gown Eye Contact: Fair Motor Activity: Freedom of movement Speech: Slurred Level of Consciousness: Alert Mood: Labile Affect: Labile Anxiety Level: None Thought Processes: Coherent, Relevant Judgement: Partial Orientation: Person, Place, Time, Situation Obsessive Compulsive Thoughts/Behaviors: None  Cognitive Functioning Concentration: Fair Memory: Recent Intact, Remote Intact Is patient IDD: No Insight: Poor Impulse Control: Poor Appetite: Poor Have you had any weight changes? : No Change Sleep: Unable to Assess Vegetative Symptoms: Unable to Assess  ADLScreening [facility] Assessment Services) Patient's cognitive ability adequate to safely complete daily activities?: Yes Patient able to express need for assistance with ADLs?: Yes Independently performs ADLs?: Yes (appropriate for developmental age)  Prior Inpatient Therapy Prior Inpatient Therapy: Yes Prior Therapy Dates: 04/2018, 03/2018, 2001 [facility]  Reason for Treatment: MDD suicidal, SA   Prior Outpatient Therapy Prior Outpatient Therapy: Yes Prior Therapy Dates: 2019 Prior Therapy Facilty/Provider(s): [facility] Reason for Treatment: Heroin  Does patient have an ACCT team?: No Does patient have Intensive In-House Services?  : No Does patient have [facility] services? : No Does patient have P4CC services?: No  ADL Screening (condition at time of admission) Patient's cognitive ability adequate to safely complete daily activities?: Yes Is the patient deaf or have difficulty hearing?: No Does the patient have difficulty seeing, even when wearing glasses/contacts?: No Does the patient have difficulty concentrating, remembering, or  making decisions?: No Patient able to express need for assistance with ADLs?: Yes Does the  patient have difficulty dressing or bathing?: No Independently performs ADLs?: Yes (appropriate for developmental age) Does the patient have difficulty walking or climbing stairs?: No       Abuse/Neglect Assessment (Assessment to be complete while patient is alone) Abuse/Neglect Assessment Can Be Completed: Yes Physical Abuse: Denies Verbal Abuse: Denies Sexual Abuse: Denies Exploitation of patient/patient's resources: Denies     Advance Directives (For Healthcare) Does Patient Have a Medical Advance Directive?: No Would patient like information on creating a medical advance directive?: No - Patient declined          Disposition:  Disposition Initial Assessment Completed for this Encounter: Jefferson Fuel, NP, recommend inpt hospitalizations )     Jaaliyah Lucatero [facility] 05/14/2019 12:15 PM

## 2019-05-14 NOTE — Consult Note (Signed)
Consult requested by: [facility] hospitalist, Dr. Dyann Kief Consult requested for: Respiratory failure/ventilator management  HPI: This is a 38 year old with a history of polysubstance abuse who was brought to the emergency department by EMS because of agitation.  Apparently he had called 911 with concern that he had overdosed on heroin.  He was able to give some history stated that he had been abusing heroin and crystal meth when he was in the emergency department.  He told the nursing staff that he wanted to die.  He later became agitated then became somnolent tachypneic hypoxic and he was intubated.  History is from the medical record as the patient is intubated sedated and on mechanical ventilation.  Past Medical History:  Diagnosis Date  . Polysubstance abuse ([place])   . Sciatica   . Shoulder dislocation      No family history on file.   Social History   Socioeconomic History  . Marital status: Single    Spouse name: Not on file  . Number of children: Not on file  . Years of education: Not on file  . Highest education level: Not on file  Occupational History  . Not on file  Social Needs  . Financial resource strain: Not on file  . Food insecurity    Worry: Not on file    Inability: Not on file  . Transportation needs    Medical: Not on file    Non-medical: Not on file  Tobacco Use  . Smoking status: Current Every Day Smoker    Packs/day: 1.00    Types: Cigarettes  . Smokeless tobacco: Never Used  . Tobacco comment: Patient refused smoking cessation education  Substance and Sexual Activity  . Alcohol use: Yes    Comment: "rarely"  . Drug use: Yes    Types: IV, Benzodiazepines    Comment: Heroin, Crack yesterday  . Sexual activity: Yes    Birth control/protection: None  Lifestyle  . Physical activity    Days per week: Not on file    Minutes per session: Not on file  . Stress: Not on file  Relationships  . Social Herbalist on phone: Not on file    Gets  together: Not on file    Attends religious service: Not on file    Active member of club or organization: Not on file    Attends meetings of clubs or organizations: Not on file    Relationship status: Not on file  Other Topics Concern  . Not on file  Social History Narrative  . Not on file     ROS: Unobtainable    Objective: Vital signs in last 24 hours: Temp:  [99.3 F (37.4 C)-102.2 F (39 C)] 99.3 F (37.4 C) (07/24 0200) Pulse Rate:  [74-119] 84 (07/24 0530) Resp:  [16-30] 16 (07/24 0530) BP: (101-128)/(56-80) 116/62 (07/24 0530) SpO2:  [93 %-100 %] 99 % (07/24 0530) FiO2 (%):  [30 %-40 %] 30 % (07/24 0449) Weight:  [79.5 kg-82.3 kg] 82.3 kg (07/24 0400) Weight change:     Intake/Output from previous day: 07/23 0701 - 07/24 0700 In: 3185.9 [I.V.:3104.6; IV Piggyback:81.3] Out: 2000 [Urine:1200; Emesis/NG output:800]  PHYSICAL EXAM Constitutional: He is intubated sedated and on the ventilator.  Eyes: Pupils react ears nose mouth and throat: He has endotracheal and orogastric tubes in place.  Cardiovascular: His heart is regular with normal heart sounds.  Respiratory: Respiratory effort per the ventilator.  He has some rhonchi bilaterally.  Gastrointestinal: Abdomen is  soft no masses are felt.  Musculoskeletal: Could not assess.  Psychiatric: Cannot assess.  Neurological: Cannot assess  Lab Results: Basic Metabolic Panel: Recent Labs    05/13/19 0517 05/13/19 1005 05/14/19 0449  NA 137  --  139  K 4.9  --  3.3*  CL 111  --  112*  CO2 22  --  21*  GLUCOSE 114*  --  103*  BUN 10  --  8  CREATININE 1.09 1.04 1.03  CALCIUM 7.9*  --  7.5*   Liver Function Tests: Recent Labs    05/12/19 2155 05/14/19 0449  AST 32 20  ALT 20 13  ALKPHOS 68 42  BILITOT 1.1 0.8  PROT 7.8 4.9*  ALBUMIN 4.5 2.6*   Recent Labs    05/12/19 2155  LIPASE 24   No results for input(s): AMMONIA in the last 72 hours. CBC: Recent Labs    05/12/19 2155 05/13/19 1005  05/14/19 0449  WBC 10.6* 7.8 6.8  NEUTROABS 7.5  --   --   HGB 14.0 13.8 11.7*  HCT 40.2 41.9 36.1*  MCV 84.8 90.1 90.9  PLT 291 188 179   Cardiac Enzymes: Recent Labs    05/12/19 2155 05/13/19 0517  CKTOTAL 875* 2,034*   BNP: No results for input(s): PROBNP in the last 72 hours. D-Dimer: No results for input(s): DDIMER in the last 72 hours. CBG: Recent Labs    05/13/19 0118 05/13/19 2011 05/13/19 2049 05/14/19 0027 05/14/19 0405  GLUCAP 103* 61* 139* 71 92   Hemoglobin A1C: No results for input(s): HGBA1C in the last 72 hours. Fasting Lipid Panel: Recent Labs    05/14/19 0449  TRIG 106   Thyroid Function Tests: No results for input(s): TSH, T4TOTAL, FREET4, T3FREE, THYROIDAB in the last 72 hours. Anemia Panel: No results for input(s): VITAMINB12, FOLATE, FERRITIN, TIBC, IRON, RETICCTPCT in the last 72 hours. Coagulation: No results for input(s): LABPROT, INR in the last 72 hours. Urine Drug Screen: Drugs of Abuse     Component Value Date/Time   LABOPIA NONE DETECTED 05/13/2019 0403   COCAINSCRNUR NONE DETECTED 05/13/2019 0403   LABBENZ NONE DETECTED 05/13/2019 0403   AMPHETMU POSITIVE (A) 05/13/2019 0403   THCU NONE DETECTED 05/13/2019 0403   LABBARB NONE DETECTED 05/13/2019 0403    Alcohol Level: Recent Labs    05/12/19 2155  ETH <10   Urinalysis: No results for input(s): COLORURINE, LABSPEC, PHURINE, GLUCOSEU, HGBUR, BILIRUBINUR, KETONESUR, PROTEINUR, UROBILINOGEN, NITRITE, LEUKOCYTESUR in the last 72 hours.  Invalid input(s): APPERANCEUR Misc. Labs:   ABGS: Recent Labs    05/14/19 0500  PHART 7.396  PO2ART 91.5  HCO3 22.5     MICROBIOLOGY: Recent Results (from the past 240 hour(s))  SARS Coronavirus 2 (CEPHEID - Performed in [place] hospital lab), Hosp Order     Status: None   Collection Time: 05/13/19  4:02 AM   Specimen: Nasopharyngeal Swab  Result Value Ref Range Status   SARS Coronavirus 2 NEGATIVE NEGATIVE Final     Comment: (NOTE) If result is NEGATIVE SARS-CoV-2 target nucleic acids are NOT DETECTED. The SARS-CoV-2 RNA is generally detectable in upper and lower  respiratory specimens during the acute phase of infection. The lowest  concentration of SARS-CoV-2 viral copies this assay can detect is 250  copies / mL. A negative result does not preclude SARS-CoV-2 infection  and should not be used as the sole basis for treatment or other  patient management decisions.  A negative result may occur  with  improper specimen collection / handling, submission of specimen other  than nasopharyngeal swab, presence of viral mutation(s) within the  areas targeted by this assay, and inadequate number of viral copies  (<250 copies / mL). A negative result must be combined with clinical  observations, patient history, and epidemiological information. If result is POSITIVE SARS-CoV-2 target nucleic acids are DETECTED. The SARS-CoV-2 RNA is generally detectable in upper and lower  respiratory specimens dur ing the acute phase of infection.  Positive  results are indicative of active infection with SARS-CoV-2.  Clinical  correlation with patient history and other diagnostic information is  necessary to determine patient infection status.  Positive results do  not rule out bacterial infection or co-infection with other viruses. If result is PRESUMPTIVE POSTIVE SARS-CoV-2 nucleic acids MAY BE PRESENT.   A presumptive positive result was obtained on the submitted specimen  and confirmed on repeat testing.  While 2019 novel coronavirus  (SARS-CoV-2) nucleic acids may be present in the submitted sample  additional confirmatory testing may be necessary for epidemiological  and / or clinical management purposes  to differentiate between  SARS-CoV-2 and other Sarbecovirus currently known to infect humans.  If clinically indicated additional testing with an alternate test  methodology [phone]) is advised. The SARS-CoV-2  RNA is generally  detectable in upper and lower respiratory sp ecimens during the acute  phase of infection. The expected result is Negative. Fact Sheet for Patients:  StrictlyIdeas.no Fact Sheet for Healthcare Providers: BankingDealers.co.za This test is not yet approved or cleared by the [place] FDA and has been authorized for detection and/or diagnosis of SARS-CoV-2 by FDA under an Emergency Use Authorization (EUA).  This EUA will remain in effect (meaning this test can be used) for the duration of the COVID-19 declaration under Section 564(b)(1) of the Act, 21 U.S.C. section 360bbb-3(b)(1), unless the authorization is terminated or revoked sooner. Performed at [facility], [address], [place], [place] [address]   MRSA PCR Screening     Status: None   Collection Time: 05/13/19  8:55 AM   Specimen: Nasal Mucosa; Nasopharyngeal  Result Value Ref Range Status   MRSA by PCR NEGATIVE NEGATIVE Final    Comment:        The GeneXpert MRSA Assay (FDA approved for NASAL specimens only), is one component of a comprehensive MRSA colonization surveillance program. It is not intended to diagnose MRSA infection nor to guide or monitor treatment for MRSA infections. Performed at [facility], [address], [place], [place] [address]   Culture, blood (Routine X 2) w Reflex to ID Panel     Status: None (Preliminary result)   Collection Time: 05/13/19 10:05 AM   Specimen: BLOOD LEFT ARM  Result Value Ref Range Status   Specimen Description BLOOD LEFT ARM  Final   Special Requests   Final    BOTTLES DRAWN AEROBIC AND ANAEROBIC Blood Culture results may not be optimal due to an inadequate volume of blood received in culture bottles   Culture   Final    NO GROWTH < 24 HOURS Performed at [facility], [address] [place], [place] [address]    Report Status PENDING  Incomplete  Culture, blood (Routine X 2) w Reflex to ID Panel      Status: None (Preliminary result)   Collection Time: 05/13/19 10:06 AM   Specimen: BLOOD LEFT ARM  Result Value Ref Range Status   Specimen Description BLOOD LEFT ARM  Final   Special  Requests   Final    BOTTLES DRAWN AEROBIC ONLY Blood Culture results may not be optimal due to an inadequate volume of blood received in culture bottles   Culture   Final    NO GROWTH < 24 HOURS Performed at [facility], [address] [place], [place] [address]    Report Status PENDING  Incomplete    Studies/Results: Dg Chest Port 1 View  Result Date: 05/13/2019 CLINICAL DATA:  Intubation. Possible overdose. EXAM: PORTABLE CHEST 1 VIEW COMPARISON:  09/03/2018 FINDINGS: Endotracheal tube tip 4.8 cm from the carina. Enteric tube in place with tip and side-port below the diaphragm in the stomach. Heterogeneous bilateral suprahilar opacities. Heart is normal in size. No large pleural effusion or pneumothorax. No acute osseous abnormalities are seen. IMPRESSION: 1. Endotracheal tube tip 4.8 cm from the carina. Enteric tube in place, tip and side-port below the diaphragm. 2. Heterogeneous bilateral suprahilar opacities may be aspiration or atelectasis. Pulmonary edema felt less likely. Electronically Signed   By: Keith Rake M.D.   On: 05/13/2019 03:59    Medications:  Prior to Admission:  No medications prior to admission.   Scheduled: . chlorhexidine gluconate (MEDLINE KIT)  15 mL Mouth Rinse BID  . Chlorhexidine Gluconate Cloth  6 each Topical Daily  . enoxaparin (LOVENOX) injection  40 mg Subcutaneous Q24H  . mouth rinse  15 mL Mouth Rinse 10 times per day  . pantoprazole (PROTONIX) IV  40 mg Intravenous Q24H   Continuous: . dextrose 5 % and 0.9% NaCl 100 mL/hr at 05/14/19 0048  . piperacillin-tazobactam 3.375 g (05/14/19 0211)  . propofol (DIPRIVAN) infusion 40 mcg/kg/min (05/14/19 0542)   ION:[id number] **OR** acetaminophen, fentaNYL (SUBLIMAZE) injection, fentaNYL (SUBLIMAZE) injection,  ipratropium-albuterol, midazolam, midazolam, ondansetron **OR** ondansetron (ZOFRAN) IV  Assesment: He had what seems to have been an intentional drug overdose.  He was intubated for airway protection and looks like he has likely bilateral upper lobe pneumonia.  Chest x-ray that I have personally reviewed today shows that the infiltrates are improved but not gone.  I think it is less likely to be atelectasis now that he has been on the ventilator for 24 hours.  He could have some mucus plugging if he vomited.  He is on Zosyn which is appropriate  His ventilator settings and his blood gas are at a point that he could be extubated depending on his mental status.  He had rhabdomyolysis with initial CK being 875.  This rose to 2034 yesterday morning.  He had acute kidney injury and his renal function seems to be improving  He had poor urine output yesterday initially and that has improved.  He is still positive about 4.5 L. Active Problems:   Drug overdose, intentional ([place])    Plan: Can attempt weaning today.    LOS: 1 day   Alonza Bogus 05/14/2019, 7:18 AM

## 2019-05-14 NOTE — Progress Notes (Signed)
Pt had met all parameters to be extubated. NIF was -42 and VC was 1200.  ABG was still pending. I walked into the room for vent alarm and he had the ETT in his hand. Patients SPO2 was 100% on RA and was vocalizing. Placed patient on 2lpm [place] and removed vent from room.

## 2019-05-14 NOTE — Progress Notes (Signed)
PROGRESS NOTE    Michael [name]  ZOX:[id number] DOB: Jun 20, 1981 DOA: 05/12/2019 PCP: Patient, No Pcp Per     Brief Narrative:  38 y.o. male, with history of polysubstance abuse was brought to the ED via EMS for agitation.  Patient called 911 with concern for possible heroin overdose.  History is obtained from the ED notes as patient is currently intubated and sedated and unable to provide any history. Patient has been been abusing heroin and crystal meth.  Patient was brought to the ED by EMS.  In the ED he told nursing staff that he just wants to die.  Later patient became agitated, had sonorous respirations, patient became tachypneic and hypoxic with O2 sats in high 80s.  Patient was intubated to protect her airway. Patient will need psychiatric evaluation when he is extubated for suicidal ideation and ongoing substance abuse.   Assessment & Plan: 1-drug overdose -With the use of heroin and crystal meth. -Patient is denying intentional overdose or suicidal ideation currently -Was initially intubated for airway protection; has self extubated himself currently. -So far protecting airways and denying any hallucinations or harmful thoughts currently. -Cessation counseling provided -Will follow psychiatry recommendation.  2-acute respiratory failure: Secondary to drug overdose and aspiration pneumonia -Continue Unasyn -Wean oxygen supplementation as tolerated -Follow clinical response.  3-mild rhabdomyolysis -Continue IV fluids -Patient advised to maintain adequate hydration.  4-acute kidney injury -Secondary to rhabdomyolysis -After fluid resuscitation renal function is essentially back to baseline -Continue to follow renal function trend -Patient advised to maintain adequate hydration.  5-hypokalemia -Follow electrolytes trend, continue repletion as needed. -Magnesium within normal limits.  6-GERD -Continue PPI.   DVT prophylaxis: SCDs Code Status: Full code  Family Communication: Mom updated over the phone on 05/13/2019; no family at bedside Disposition Plan: Narrow antibiotics to Unasyn; if demonstrated full tolerance of oral intake transition antibiotics to oral regimen in a.m.  Continue weaning oxygen supplementation as tolerated.  Increase physical activity.  Transfer to MedSurg bed.  Follow electrolytes and renal function in a.m.  Consultants:   Psychiatry  Procedures:   See below for x-ray reports.  Antimicrobials:  Anti-infectives (From admission, onward)   Start     Dose/Rate Route Frequency Ordered Stop   05/14/19 1100  Ampicillin-Sulbactam (UNASYN) 3 g in sodium chloride 0.9 % 100 mL IVPB     3 g 200 mL/hr over 30 Minutes Intravenous Every 6 hours 05/14/19 1025     05/13/19 0815  piperacillin-tazobactam (ZOSYN) IVPB 3.375 g  Status:  Discontinued     3.375 g 12.5 mL/hr over 240 Minutes Intravenous Every 8 hours 05/13/19 0807 05/14/19 1025       Subjective: Currently afebrile, denies chest pain, no nausea, no vomiting.  Objective: Vitals:   05/14/19 1200 05/14/19 1300 05/14/19 1400 05/14/19 1630  BP:      Pulse: 87 81 82   Resp: (!) 27 (!) 21 (!) 24   Temp:    98.3 F (36.8 C)  TempSrc:    Oral  SpO2: 100% 99% 97%   Weight:      Height:        Intake/Output Summary (Last 24 hours) at 05/14/2019 1750 Last data filed at 05/14/2019 1633 Gross per 24 hour  Intake 2083.51 ml  Output 3200 ml  Net -1116.49 ml   Filed Weights   05/13/19 1100 05/14/19 0400  Weight: 79.5 kg 82.3 kg    Examination: General exam: Alert, awake, oriented x 3; currently denies suicidal ideation.  Patient with low-grade temperature overnight.  Denies chest pain.  He is hoarse after self extubation today.  No chest pain, no nausea, no vomiting. Respiratory system: Positive rhonchi right, no wheezing, no using accessory muscles. Cardiovascular system:RRR. No murmurs, rubs, gallops. Gastrointestinal system: Abdomen is nondistended, soft and  nontender. No organomegaly or masses felt. Normal bowel sounds heard. Central nervous system: Alert and oriented. No focal neurological deficits. Extremities: No cyanosis or clubbing. Skin: No rashes, lesions or ulcers Psychiatry: Flat affect on exam.  Denies suicidal ideation or hallucinations.    Data Reviewed: I have personally reviewed following labs and imaging studies  CBC: Recent Labs  Lab 05/12/19 2155 05/13/19 1005 05/14/19 0449  WBC 10.6* 7.8 6.8  NEUTROABS 7.5  --   --   HGB 14.0 13.8 11.7*  HCT 40.2 41.9 36.1*  MCV 84.8 90.1 90.9  PLT 291 188 179   Basic Metabolic Panel: Recent Labs  Lab 05/12/19 2155 05/13/19 0517 05/13/19 1005 05/14/19 0449  NA 130* 137  --  139  K 2.9* 4.9  --  3.3*  CL 100 111  --  112*  CO2 18* 22  --  21*  GLUCOSE 92 114*  --  103*  BUN 15 10  --  8  CREATININE 1.40* 1.09 1.04 1.03  CALCIUM 9.4 7.9*  --  7.5*   GFR: Estimated Creatinine Clearance: 102.4 mL/min (by C-G formula based on SCr of 1.03 mg/dL).   Liver Function Tests: Recent Labs  Lab 05/12/19 2155 05/14/19 0449  AST 32 20  ALT 20 13  ALKPHOS 68 42  BILITOT 1.1 0.8  PROT 7.8 4.9*  ALBUMIN 4.5 2.6*   Recent Labs  Lab 05/12/19 2155  LIPASE 24   Cardiac Enzymes: Recent Labs  Lab 05/12/19 2155 05/13/19 0517  CKTOTAL 875* 2,034*   CBG: Recent Labs  Lab 05/13/19 2049 05/14/19 0027 05/14/19 0405 05/14/19 0737 05/14/19 1106  GLUCAP 139* 71 92 93 94   Lipid Profile: Recent Labs    05/13/19 1005 05/14/19 0449  TRIG 76 106   Urine analysis:    Component Value Date/Time   COLORURINE YELLOW 01/21/2016 1143   APPEARANCEUR CLEAR 01/21/2016 1143   LABSPEC 1.015 01/21/2016 1143   PHURINE 6.0 01/21/2016 1143   GLUCOSEU NEGATIVE 01/21/2016 1143   HGBUR NEGATIVE 01/21/2016 1143   BILIRUBINUR NEGATIVE 01/21/2016 1143   KETONESUR NEGATIVE 01/21/2016 1143   PROTEINUR NEGATIVE 01/21/2016 1143   NITRITE NEGATIVE 01/21/2016 1143   LEUKOCYTESUR NEGATIVE  01/21/2016 1143    Recent Results (from the past 240 hour(s))  SARS Coronavirus 2 (CEPHEID - Performed in [facility] hospital lab), Hosp Order     Status: None   Collection Time: 05/13/19  4:02 AM   Specimen: Nasopharyngeal Swab  Result Value Ref Range Status   SARS Coronavirus 2 NEGATIVE NEGATIVE Final    Comment: (NOTE) If result is NEGATIVE SARS-CoV-2 target nucleic acids are NOT DETECTED. The SARS-CoV-2 RNA is generally detectable in upper and lower  respiratory specimens during the acute phase of infection. The lowest  concentration of SARS-CoV-2 viral copies this assay can detect is 250  copies / mL. A negative result does not preclude SARS-CoV-2 infection  and should not be used as the sole basis for treatment or other  patient management decisions.  A negative result may occur with  improper specimen collection / handling, submission of specimen other  than nasopharyngeal swab, presence of viral mutation(s) within the  areas targeted by this assay, and  inadequate number of viral copies  (<250 copies / mL). A negative result must be combined with clinical  observations, patient history, and epidemiological information. If result is POSITIVE SARS-CoV-2 target nucleic acids are DETECTED. The SARS-CoV-2 RNA is generally detectable in upper and lower  respiratory specimens dur ing the acute phase of infection.  Positive  results are indicative of active infection with SARS-CoV-2.  Clinical  correlation with patient history and other diagnostic information is  necessary to determine patient infection status.  Positive results do  not rule out bacterial infection or co-infection with other viruses. If result is PRESUMPTIVE POSTIVE SARS-CoV-2 nucleic acids MAY BE PRESENT.   A presumptive positive result was obtained on the submitted specimen  and confirmed on repeat testing.  While 2019 novel coronavirus  (SARS-CoV-2) nucleic acids may be present in the submitted sample   additional confirmatory testing may be necessary for epidemiological  and / or clinical management purposes  to differentiate between  SARS-CoV-2 and other Sarbecovirus currently known to infect humans.  If clinically indicated additional testing with an alternate test  methodology [phone]([id number]) is advised. The SARS-CoV-2 RNA is generally  detectable in upper and lower respiratory sp ecimens during the acute  phase of infection. The expected result is Negative. Fact Sheet for Patients:  BoilerBrush.com.cyhttps://www.fda.gov/media/136312/download Fact Sheet for Healthcare Providers: https://pope.com/https://www.fda.gov/media/136313/download This test is not yet approved or cleared by the [place] FDA and has been authorized for detection and/or diagnosis of SARS-CoV-2 by FDA under an Emergency Use Authorization (EUA).  This EUA will remain in effect (meaning this test can be used) for the duration of the COVID-19 declaration under Section 564(b)(1) of the Act, 21 U.S.C. section 360bbb-3(b)(1), unless the authorization is terminated or revoked sooner. Performed at [facility], [address] [place], [place] [address]   MRSA PCR Screening     Status: None   Collection Time: 05/13/19  8:55 AM   Specimen: Nasal Mucosa; Nasopharyngeal  Result Value Ref Range Status   MRSA by PCR NEGATIVE NEGATIVE Final    Comment:        The GeneXpert MRSA Assay (FDA approved for NASAL specimens only), is one component of a comprehensive MRSA colonization surveillance program. It is not intended to diagnose MRSA infection nor to guide or monitor treatment for MRSA infections. Performed at [facility], [address] [place], [place] [address]   Culture, blood (Routine X 2) w Reflex to ID Panel     Status: None (Preliminary result)   Collection Time: 05/13/19 10:05 AM   Specimen: BLOOD LEFT ARM  Result Value Ref Range Status   Specimen Description BLOOD LEFT ARM  Final   Special Requests   Final    BOTTLES DRAWN AEROBIC AND  ANAEROBIC Blood Culture results may not be optimal due to an inadequate volume of blood received in culture bottles   Culture   Final    NO GROWTH < 24 HOURS Performed at [facility], [address] [place], [place] [address]    Report Status PENDING  Incomplete  Culture, blood (Routine X 2) w Reflex to ID Panel     Status: None (Preliminary result)   Collection Time: 05/13/19 10:06 AM   Specimen: BLOOD LEFT ARM  Result Value Ref Range Status   Specimen Description BLOOD LEFT ARM  Final   Special Requests   Final    BOTTLES DRAWN AEROBIC ONLY Blood Culture results may not be optimal due to an inadequate volume of blood received in culture  bottles   Culture   Final    NO GROWTH < 24 HOURS Performed at [facility], [address] [place], [place] [address]    Report Status PENDING  Incomplete     Radiology Studies: Dg Chest Port 1 View  Result Date: 05/14/2019 CLINICAL DATA:  Respiratory failure. EXAM: PORTABLE CHEST 1 VIEW COMPARISON:  05/13/2019 and prior exams FINDINGS: Cardiomediastinal silhouette is unchanged. An endotracheal tube with tip 2.5 cm above the carina and NG tube within the stomach noted. Bilateral UPPER lung airspace opacities have decreased. No pneumothorax.  No other significant change. IMPRESSION: Decreased bilateral UPPER lung airspace opacities. No other significant change. Electronically Signed   By: Harmon PierJeffrey  Hu M.D.   On: 05/14/2019 08:27   Dg Chest Port 1 View  Result Date: 05/13/2019 CLINICAL DATA:  Intubation. Possible overdose. EXAM: PORTABLE CHEST 1 VIEW COMPARISON:  09/03/2018 FINDINGS: Endotracheal tube tip 4.8 cm from the carina. Enteric tube in place with tip and side-port below the diaphragm in the stomach. Heterogeneous bilateral suprahilar opacities. Heart is normal in size. No large pleural effusion or pneumothorax. No acute osseous abnormalities are seen. IMPRESSION: 1. Endotracheal tube tip 4.8 cm from the carina. Enteric tube in place, tip and  side-port below the diaphragm. 2. Heterogeneous bilateral suprahilar opacities may be aspiration or atelectasis. Pulmonary edema felt less likely. Electronically Signed   By: Narda RutherfordMelanie  Sanford M.D.   On: 05/13/2019 03:59     Scheduled Meds: . Chlorhexidine Gluconate Cloth  6 each Topical Daily  . pantoprazole  40 mg Oral Daily   Continuous Infusions: . sodium chloride    . ampicillin-sulbactam (UNASYN) IV 3 g (05/14/19 1232)     LOS: 1 day    Time spent: 35 minutes. Greater than 50% of this time was spent in direct contact with the patient, coordinating care and discussing relevant ongoing clinical issues, including aspiration pneumonia, cessation counseling for ongoing polysubstance abuse, concerns for suicidal ideation at presentation and positive Hoarseness after self extubation.Vassie Loll.     Chauna Osoria, MD [facility] Pager [phone]   05/14/2019, 5:50 PM

## 2019-05-14 NOTE — Progress Notes (Signed)
This RN attempted to reach patients mother Pamala Hurry to advise that patient had transferred to room 310. [phone] phone number given to mother to reach patients nurse.

## 2019-05-15 ENCOUNTER — Inpatient Hospital Stay (HOSPITAL_COMMUNITY): Payer: Self-pay

## 2019-05-15 LAB — BASIC METABOLIC PANEL
Anion gap: 10 (ref 5–15)
BUN: <5 mg/dL — ABNORMAL LOW (ref 6–20)
CO2: 24 mmol/L (ref 22–32)
Calcium: 7.8 mg/dL — ABNORMAL LOW (ref 8.9–10.3)
Chloride: 105 mmol/L (ref 98–111)
Creatinine, Ser: 0.83 mg/dL (ref 0.61–1.24)
GFR calc Af Amer: >60 mL/min (ref >60)
GFR calc non Af Amer: >60 mL/min (ref >60)
Glucose, Bld: 107 mg/dL — ABNORMAL HIGH (ref 70–99)
Potassium: 2.9 mmol/L — ABNORMAL LOW (ref 3.5–5.1)
Sodium: 139 mmol/L (ref 135–145)

## 2019-05-15 LAB — CBC
HCT: 34.2 % — ABNORMAL LOW (ref 39.0–52.0)
Hemoglobin: 11.6 g/dL — ABNORMAL LOW (ref 13.0–17.0)
MCH: 29.4 pg (ref 26.0–34.0)
MCHC: 33.9 g/dL (ref 30.0–36.0)
MCV: 86.6 fL (ref 80.0–100.0)
Platelets: 202 10*3/uL (ref 150–400)
RBC: 3.95 MIL/uL — ABNORMAL LOW (ref 4.22–5.81)
RDW: 11.8 % (ref 11.5–15.5)
WBC: 6.5 10*3/uL (ref 4.0–10.5)
nRBC: 0 % (ref 0.0–0.2)

## 2019-05-15 LAB — POTASSIUM: Potassium: 3.4 mmol/L — ABNORMAL LOW (ref 3.5–5.1)

## 2019-05-15 LAB — MAGNESIUM: Magnesium: 1.8 mg/dL (ref 1.7–2.4)

## 2019-05-15 MED ORDER — POTASSIUM CHLORIDE CRYS ER 20 MEQ PO TBCR
40.0000 meq | EXTENDED_RELEASE_TABLET | ORAL | Status: AC
  Administered 2019-05-15 (×3): 40 meq via ORAL
  Filled 2019-05-15 (×3): qty 2

## 2019-05-15 MED ORDER — LORAZEPAM 2 MG/ML IJ SOLN
0.0000 mg | Freq: Four times a day (QID) | INTRAMUSCULAR | Status: DC
  Administered 2019-05-15 – 2019-05-16 (×3): 1 mg via INTRAVENOUS
  Filled 2019-05-15 (×4): qty 1

## 2019-05-15 MED ORDER — LORAZEPAM 1 MG PO TABS: 1.0000 mg | ORAL_TABLET | Freq: Four times a day (QID) | ORAL | Status: DC | PRN

## 2019-05-15 MED ORDER — POTASSIUM CHLORIDE IN NACL 40-0.9 MEQ/L-% IV SOLN
INTRAVENOUS | Status: DC
  Administered 2019-05-15 – 2019-05-16 (×3): 125 mL/h via INTRAVENOUS

## 2019-05-15 MED ORDER — POTASSIUM CHLORIDE 10 MEQ/100ML IV SOLN
10.0000 meq | INTRAVENOUS | Status: DC
  Administered 2019-05-15: 10 meq via INTRAVENOUS
  Filled 2019-05-15: qty 100

## 2019-05-15 MED ORDER — ADULT MULTIVITAMIN W/MINERALS CH
1.0000 | ORAL_TABLET | Freq: Every day | ORAL | Status: DC
  Administered 2019-05-15 – 2019-05-16 (×2): 1 via ORAL
  Filled 2019-05-15 (×2): qty 1

## 2019-05-15 MED ORDER — THIAMINE HCL 100 MG/ML IJ SOLN: 100.0000 mg | Freq: Every day | INTRAMUSCULAR | Status: DC

## 2019-05-15 MED ORDER — VITAMIN B-1 100 MG PO TABS
100.0000 mg | ORAL_TABLET | Freq: Every day | ORAL | Status: DC
  Administered 2019-05-15 – 2019-05-16 (×2): 100 mg via ORAL
  Filled 2019-05-15 (×2): qty 1

## 2019-05-15 MED ORDER — LORAZEPAM 2 MG/ML IJ SOLN
1.0000 mg | Freq: Four times a day (QID) | INTRAMUSCULAR | Status: DC | PRN
  Administered 2019-05-15 – 2019-05-16 (×3): 1 mg via INTRAVENOUS
  Filled 2019-05-15 (×3): qty 1

## 2019-05-15 MED ORDER — LORAZEPAM 2 MG/ML IJ SOLN: 0.0000 mg | Freq: Two times a day (BID) | INTRAMUSCULAR | Status: DC

## 2019-05-15 MED ORDER — SODIUM CHLORIDE 0.9 % IV SOLN: INTRAVENOUS | Status: DC

## 2019-05-15 MED ORDER — FOLIC ACID 1 MG PO TABS
1.0000 mg | ORAL_TABLET | Freq: Every day | ORAL | Status: DC
  Administered 2019-05-15 – 2019-05-16 (×2): 1 mg via ORAL
  Filled 2019-05-15 (×2): qty 1

## 2019-05-15 NOTE — Progress Notes (Addendum)
CSW contacted referral facilities with the following results:  Still reviewing: [facility] (resent per request) [facility] (voicemail left)  Denied: [facility] (primary diagnosis substance use) [facility] (at capacity) [facility] (at capacity) [facility] (at capacity) [facility] (at capacity)  TTS will continue to seek bed placement.   Chalmers Guest. Guerry Bruin, MSW, [place] Work/Disposition Phone: [phone] Fax: [phone]

## 2019-05-15 NOTE — Progress Notes (Signed)
PROGRESS NOTE    Michael HewRobert Jackson Templer [name]  WUJ:[id number] DOB: 08/24/1981 DOA: 05/12/2019 PCP: Patient, No Pcp Per     Brief Narrative:  38 y.o. male, with history of polysubstance abuse was brought to the ED via EMS for agitation.  Patient called 911 with concern for possible heroin overdose.  History is obtained from the ED notes as patient is currently intubated and sedated and unable to provide any history. Patient has been been abusing heroin and crystal meth.  Patient was brought to the ED by EMS.  In the ED he told nursing staff that he just wants to die.  Later patient became agitated, had sonorous respirations, patient became tachypneic and hypoxic with O2 sats in high 80s.  Patient was intubated to protect her airway. Patient will need psychiatric evaluation when he is extubated for suicidal ideation and ongoing substance abuse.   Assessment & Plan: 1-drug overdose -With the use of heroin and crystal meth. -Patient is denying intentional overdose or suicidal ideation currently -Was initially intubated for airway protection; but subsequently self extubated -Respiratory status currently stable -Seen by psychiatry with recommendations for inpatient psychiatry.  Patient is refusing to go to inpatient psychiatry and therefore IVC papers were completed -During my visit today, I was accompanied by a police officer who helped explained to the patient that he was under involuntary commitment orders.  2-acute respiratory failure: Secondary to drug overdose and aspiration pneumonitis -Initially treated with Unasyn -Repeat chest x-ray this morning does not show any evidence of pneumonia -He is currently afebrile, on room air without any shortness of breath -We will discontinue further antibiotics  3-mild rhabdomyolysis -Continue IV fluids -Patient advised to maintain adequate hydration.  4-acute kidney injury -Secondary to rhabdomyolysis -After fluid resuscitation renal function is  essentially back to baseline -Continue to follow renal function trend -Patient advised to maintain adequate hydration.  5-hypokalemia -Follow electrolytes trend, continue repletion as needed. -Magnesium within normal limits.  6-GERD -Continue PPI.   DVT prophylaxis: SCDs Code Status: Full code Family Communication: Mom updated over the phone on 05/15/2019; at patient's request Disposition Plan: Seen by psychiatry with recommendations for inpatient psychiatry.  Awaiting bed placement.  Consultants:   Psychiatry  Procedures:   See below for x-ray reports.  Antimicrobials:  Anti-infectives (From admission, onward)   Start     Dose/Rate Route Frequency Ordered Stop   05/14/19 1100  Ampicillin-Sulbactam (UNASYN) 3 g in sodium chloride 0.9 % 100 mL IVPB     3 g 200 mL/hr over 30 Minutes Intravenous Every 6 hours 05/14/19 1025     05/13/19 0815  piperacillin-tazobactam (ZOSYN) IVPB 3.375 g  Status:  Discontinued     3.375 g 12.5 mL/hr over 240 Minutes Intravenous Every 8 hours 05/13/19 0807 05/14/19 1025       Subjective: Patient is feeling better.  No shortness of breath.  Wants to go home.  Objective: Vitals:   05/15/19 0510 05/15/19 1200 05/15/19 1548 05/15/19 1800  BP: 128/83 135/74 119/80 116/62  Pulse: 73 85 75 76  Resp: 18 19 19 18   Temp: 98.4 F (36.9 C) 98.5 F (36.9 C) 98.7 F (37.1 C) 98.8 F (37.1 C)  TempSrc: Oral Oral Oral Oral  SpO2: 100% 99% 100% 99%  Weight:      Height:        Intake/Output Summary (Last 24 hours) at 05/15/2019 1837 Last data filed at 05/15/2019 1700 Gross per 24 hour  Intake 3590.55 ml  Output 5400 ml  Net -  1809.45 ml   Filed Weights   05/13/19 1100 05/14/19 0400  Weight: 79.5 kg 82.3 kg    Examination: General exam: Alert, awake, oriented x 3 Respiratory system: Clear to auscultation. Respiratory effort normal. Cardiovascular system:RRR. No murmurs, rubs, gallops. Gastrointestinal system: Abdomen is nondistended,  soft and nontender. No organomegaly or masses felt. Normal bowel sounds heard. Central nervous system: Alert and oriented. No focal neurological deficits. Extremities: No C/C/E, +pedal pulses Skin: No rashes, lesions or ulcers Psychiatry: Speech is pressured at times.  Appears to be agitated     Data Reviewed: I have personally reviewed following labs and imaging studies  CBC: Recent Labs  Lab 05/12/19 2155 05/13/19 1005 05/14/19 0449 05/15/19 0556  WBC 10.6* 7.8 6.8 6.5  NEUTROABS 7.5  --   --   --   HGB 14.0 13.8 11.7* 11.6*  HCT 40.2 41.9 36.1* 34.2*  MCV 84.8 90.1 90.9 86.6  PLT 291 188 179 202   Basic Metabolic Panel: Recent Labs  Lab 05/12/19 2155 05/13/19 0517 05/13/19 1005 05/14/19 0449 05/15/19 0556  NA 130* 137  --  139 139  K 2.9* 4.9  --  3.3* 2.9*  CL 100 111  --  112* 105  CO2 18* 22  --  21* 24  GLUCOSE 92 114*  --  103* 107*  BUN 15 10  --  8 <5*  CREATININE 1.40* 1.09 1.04 1.03 0.83  CALCIUM 9.4 7.9*  --  7.5* 7.8*  MG  --   --   --   --  1.8   GFR: Estimated Creatinine Clearance: 127 mL/min (by C-G formula based on SCr of 0.83 mg/dL).   Liver Function Tests: Recent Labs  Lab 05/12/19 2155 05/14/19 0449  AST 32 20  ALT 20 13  ALKPHOS 68 42  BILITOT 1.1 0.8  PROT 7.8 4.9*  ALBUMIN 4.5 2.6*   Recent Labs  Lab 05/12/19 2155  LIPASE 24   Cardiac Enzymes: Recent Labs  Lab 05/12/19 2155 05/13/19 0517  CKTOTAL 875* 2,034*   CBG: Recent Labs  Lab 05/13/19 2049 05/14/19 0027 05/14/19 0405 05/14/19 0737 05/14/19 1106  GLUCAP 139* 71 92 93 94   Lipid Profile: Recent Labs    05/13/19 1005 05/14/19 0449  TRIG 76 106   Urine analysis:    Component Value Date/Time   COLORURINE YELLOW 01/21/2016 1143   APPEARANCEUR CLEAR 01/21/2016 1143   LABSPEC 1.015 01/21/2016 1143   PHURINE 6.0 01/21/2016 1143   GLUCOSEU NEGATIVE 01/21/2016 1143   HGBUR NEGATIVE 01/21/2016 1143   BILIRUBINUR NEGATIVE 01/21/2016 1143   KETONESUR  NEGATIVE 01/21/2016 1143   PROTEINUR NEGATIVE 01/21/2016 1143   NITRITE NEGATIVE 01/21/2016 1143   LEUKOCYTESUR NEGATIVE 01/21/2016 1143    Recent Results (from the past 240 hour(s))  SARS Coronavirus 2 (CEPHEID - Performed in [facility] hospital lab), Hosp Order     Status: None   Collection Time: 05/13/19  4:02 AM   Specimen: Nasopharyngeal Swab  Result Value Ref Range Status   SARS Coronavirus 2 NEGATIVE NEGATIVE Final    Comment: (NOTE) If result is NEGATIVE SARS-CoV-2 target nucleic acids are NOT DETECTED. The SARS-CoV-2 RNA is generally detectable in upper and lower  respiratory specimens during the acute phase of infection. The lowest  concentration of SARS-CoV-2 viral copies this assay can detect is 250  copies / mL. A negative result does not preclude SARS-CoV-2 infection  and should not be used as the sole basis for treatment or other  patient management decisions.  A negative result may occur with  improper specimen collection / handling, submission of specimen other  than nasopharyngeal swab, presence of viral mutation(s) within the  areas targeted by this assay, and inadequate number of viral copies  (<250 copies / mL). A negative result must be combined with clinical  observations, patient history, and epidemiological information. If result is POSITIVE SARS-CoV-2 target nucleic acids are DETECTED. The SARS-CoV-2 RNA is generally detectable in upper and lower  respiratory specimens dur ing the acute phase of infection.  Positive  results are indicative of active infection with SARS-CoV-2.  Clinical  correlation with patient history and other diagnostic information is  necessary to determine patient infection status.  Positive results do  not rule out bacterial infection or co-infection with other viruses. If result is PRESUMPTIVE POSTIVE SARS-CoV-2 nucleic acids MAY BE PRESENT.   A presumptive positive result was obtained on the submitted specimen  and confirmed on  repeat testing.  While 2019 novel coronavirus  (SARS-CoV-2) nucleic acids may be present in the submitted sample  additional confirmatory testing may be necessary for epidemiological  and / or clinical management purposes  to differentiate between  SARS-CoV-2 and other Sarbecovirus currently known to infect humans.  If clinically indicated additional testing with an alternate test  methodology [phone]) is advised. The SARS-CoV-2 RNA is generally  detectable in upper and lower respiratory sp ecimens during the acute  phase of infection. The expected result is Negative. Fact Sheet for Patients:  StrictlyIdeas.no Fact Sheet for Healthcare Providers: BankingDealers.co.za This test is not yet approved or cleared by the [place] FDA and has been authorized for detection and/or diagnosis of SARS-CoV-2 by FDA under an Emergency Use Authorization (EUA).  This EUA will remain in effect (meaning this test can be used) for the duration of the COVID-19 declaration under Section 564(b)(1) of the Act, 21 U.S.C. section 360bbb-3(b)(1), unless the authorization is terminated or revoked sooner. Performed at [facility], [address], [place], [place] [address]   MRSA PCR Screening     Status: None   Collection Time: 05/13/19  8:55 AM   Specimen: Nasal Mucosa; Nasopharyngeal  Result Value Ref Range Status   MRSA by PCR NEGATIVE NEGATIVE Final    Comment:        The GeneXpert MRSA Assay (FDA approved for NASAL specimens only), is one component of a comprehensive MRSA colonization surveillance program. It is not intended to diagnose MRSA infection nor to guide or monitor treatment for MRSA infections. Performed at [facility], [address], [place], [place] [address]   Culture, blood (Routine X 2) w Reflex to ID Panel     Status: None (Preliminary result)   Collection Time: 05/13/19 10:05 AM   Specimen: BLOOD LEFT ARM  Result Value Ref  Range Status   Specimen Description BLOOD LEFT ARM  Final   Special Requests   Final    BOTTLES DRAWN AEROBIC AND ANAEROBIC Blood Culture results may not be optimal due to an inadequate volume of blood received in culture bottles   Culture   Final    NO GROWTH 2 DAYS Performed at [facility], [address], [place], [place] [address]    Report Status PENDING  Incomplete  Culture, blood (Routine X 2) w Reflex to ID Panel     Status: None (Preliminary result)   Collection Time: 05/13/19 10:06 AM   Specimen: BLOOD LEFT ARM  Result Value Ref Range Status   Specimen Description  BLOOD LEFT ARM  Final   Special Requests   Final    BOTTLES DRAWN AEROBIC ONLY Blood Culture results may not be optimal due to an inadequate volume of blood received in culture bottles   Culture   Final    NO GROWTH 2 DAYS Performed at [facility], [address] [place], [place] [address]    Report Status PENDING  Incomplete     Radiology Studies: Dg Chest Port 1 View  Result Date: 05/15/2019 CLINICAL DATA:  38 year old male with history of shortness of breath. EXAM: PORTABLE CHEST 1 VIEW COMPARISON:  Chest x-ray 05/14/2019. FINDINGS: Previously noted endotracheal tube and nasogastric tubes have both been removed. Lung volumes are normal. No consolidative airspace disease. No pleural effusions. No pneumothorax. No pulmonary nodule or mass noted. Pulmonary vasculature and the cardiomediastinal silhouette are within normal limits. IMPRESSION: No radiographic evidence of acute cardiopulmonary disease. Electronically Signed   By: Trudie Reedaniel  Entrikin M.D.   On: 05/15/2019 08:20   Dg Chest Port 1 View  Result Date: 05/14/2019 CLINICAL DATA:  Respiratory failure. EXAM: PORTABLE CHEST 1 VIEW COMPARISON:  05/13/2019 and prior exams FINDINGS: Cardiomediastinal silhouette is unchanged. An endotracheal tube with tip 2.5 cm above the carina and NG tube within the stomach noted. Bilateral UPPER lung airspace opacities have  decreased. No pneumothorax.  No other significant change. IMPRESSION: Decreased bilateral UPPER lung airspace opacities. No other significant change. Electronically Signed   By: Harmon PierJeffrey  Hu M.D.   On: 05/14/2019 08:27     Scheduled Meds: . Chlorhexidine Gluconate Cloth  6 each Topical Daily  . folic acid  1 mg Oral Daily  . LORazepam  0-4 mg Intravenous Q6H   Followed by  . [START ON 05/17/2019] LORazepam  0-4 mg Intravenous Q12H  . multivitamin with minerals  1 tablet Oral Daily  . nicotine  21 mg Transdermal Daily  . pantoprazole  40 mg Oral Daily  . potassium chloride  40 mEq Oral Q4H  . thiamine  100 mg Oral Daily   Or  . thiamine  100 mg Intravenous Daily   Continuous Infusions: . 0.9 % NaCl with KCl 40 mEq / L 125 mL/hr (05/15/19 1710)  . ampicillin-sulbactam (UNASYN) IV 3 g (05/15/19 1754)     LOS: 2 days    Time spent: 35 minutes. Erick Blinks.     Nimai Burbach, MD [facility] Pager [phone]   05/15/2019, 6:37 PM

## 2019-05-15 NOTE — BH Assessment (Signed)
Power Assessment Progress Note   Patient was seen for re-assessment today.  He states that he has two family members at home that are dying and he states that he needs to get out of the hospital to go home to be with them.  Patient denies that he was suicidal.  He states that he accidentally used too much heroin.  Patient states that he has overdosed in the past, but never this bad.  Patient states, "I need to be discharged.  I will go to the [facility] and get my strips and everythingwill be okay."  Patient states, "I do not need to come to [facility] for two to three days.  I am in my right mind and I am not coming over there, I still have that choice don't I."  TTS informed patient that his case will be staffed with FNP for final disposition.

## 2019-05-15 NOTE — Plan of Care (Signed)

## 2019-05-15 NOTE — BH Assessment (Signed)
[place] Assessment Progress Note   Per Priscille Loveless, NP, patient is recommended for inpatient treatment and will need to be involuntarily committed if he is not willing to go to a psych facility for admission.

## 2019-05-16 DIAGNOSIS — K219 Gastro-esophageal reflux disease without esophagitis: Secondary | ICD-10-CM

## 2019-05-16 DIAGNOSIS — J189 Pneumonia, unspecified organism: Secondary | ICD-10-CM

## 2019-05-16 DIAGNOSIS — J9690 Respiratory failure, unspecified, unspecified whether with hypoxia or hypercapnia: Secondary | ICD-10-CM

## 2019-05-16 DIAGNOSIS — Z720 Tobacco use: Secondary | ICD-10-CM

## 2019-05-16 DIAGNOSIS — F332 Major depressive disorder, recurrent severe without psychotic features: Secondary | ICD-10-CM

## 2019-05-16 LAB — BASIC METABOLIC PANEL
Anion gap: 7 (ref 5–15)
BUN: <5 mg/dL — ABNORMAL LOW (ref 6–20)
CO2: 24 mmol/L (ref 22–32)
Calcium: 8.4 mg/dL — ABNORMAL LOW (ref 8.9–10.3)
Chloride: 106 mmol/L (ref 98–111)
Creatinine, Ser: 0.84 mg/dL (ref 0.61–1.24)
GFR calc Af Amer: >60 mL/min (ref >60)
GFR calc non Af Amer: >60 mL/min (ref >60)
Glucose, Bld: 108 mg/dL — ABNORMAL HIGH (ref 70–99)
Potassium: 4 mmol/L (ref 3.5–5.1)
Sodium: 137 mmol/L (ref 135–145)

## 2019-05-16 MED ORDER — LORAZEPAM 2 MG/ML IJ SOLN
1.0000 mg | Freq: Once | INTRAMUSCULAR | Status: AC
  Administered 2019-05-16: 1 mg via INTRAVENOUS

## 2019-05-16 MED ORDER — ADULT MULTIVITAMIN W/MINERALS CH: 1.0000 | ORAL_TABLET | Freq: Every day | ORAL | 1 refills | Status: AC

## 2019-05-16 MED ORDER — ACETAMINOPHEN 325 MG PO TABS: 650.0000 mg | ORAL_TABLET | Freq: Four times a day (QID) | ORAL | 0 refills | Status: DC | PRN

## 2019-05-16 MED ORDER — NICOTINE 21 MG/24HR TD PT24: 21.0000 mg | MEDICATED_PATCH | Freq: Every day | TRANSDERMAL | 1 refills | Status: AC

## 2019-05-16 MED ORDER — PANTOPRAZOLE SODIUM 40 MG PO TBEC: 40.0000 mg | DELAYED_RELEASE_TABLET | Freq: Every day | ORAL | 1 refills | Status: AC

## 2019-05-16 NOTE — Progress Notes (Signed)
PROGRESS NOTE    Michael [name]  ZOX:[id number] DOB: July 09, 1981 DOA: 05/12/2019 PCP: Patient, No Pcp Per     Brief Narrative:  38 y.o. male, with history of polysubstance abuse was brought to the ED via EMS for agitation.  Patient called 911 with concern for possible heroin overdose.  History is obtained from the ED notes as patient is currently intubated and sedated and unable to provide any history. Patient has been been abusing heroin and crystal meth.  Patient was brought to the ED by EMS.  In the ED he told nursing staff that he just wants to die.  Later patient became agitated, had sonorous respirations, patient became tachypneic and hypoxic with O2 sats in high 80s.  Patient was intubated to protect her airway. Patient will need psychiatric evaluation when he is extubated for suicidal ideation and ongoing substance abuse.   Assessment & Plan: 1-drug overdose -With the use of heroin and crystal meth. -Patient is denying intentional overdose or suicidal ideation currently -Was initially intubated for airway protection; but subsequently self extubated -Respiratory status currently stable. -Patient is IVC at this point. -Patient is medically clear for discharge -Waiting final psychiatry recommendations.  2-acute respiratory failure: Secondary to drug overdose and aspiration pneumonitis -Initially treated with Unasyn. -Repeat chest x-ray this morning does not show any evidence of pneumonia -He is currently afebrile, on room air without any shortness of breath. -Continue to monitor off antibiotics.  3-mild rhabdomyolysis -Patient advised to maintain adequate hydration.  4-acute kidney injury -Secondary to rhabdomyolysis -After fluid resuscitation renal function is back to normal.   -Continue to follow renal function trend intermittently. -Patient advised to maintain adequate hydration.  5-hypokalemia -Repleted within normal limits currently -Magnesium within normal  limits.  6-GERD -Continue PPI.   DVT prophylaxis: SCDs Code Status: Full code Family Communication: Mom updated over the phone on 05/15/2019; at patient's request. Disposition Plan: Seen by psychiatry once again this morning, final recommendations pending.  Patient denies suicidal ideation or hallucinations at this time.  Patient is medically stable otherwise for discharge.  Consultants:   Psychiatry  Procedures:   See below for x-ray reports.  Antimicrobials:  Anti-infectives (From admission, onward)   Start     Dose/Rate Route Frequency Ordered Stop   05/14/19 1100  Ampicillin-Sulbactam (UNASYN) 3 g in sodium chloride 0.9 % 100 mL IVPB  Status:  Discontinued     3 g 200 mL/hr over 30 Minutes Intravenous Every 6 hours 05/14/19 1025 05/15/19 1839   05/13/19 0815  piperacillin-tazobactam (ZOSYN) IVPB 3.375 g  Status:  Discontinued     3.375 g 12.5 mL/hr over 240 Minutes Intravenous Every 8 hours 05/13/19 0807 05/14/19 1025       Subjective: Patient is feeling much better and currently afebrile.  Wants to go home.  Denies suicidal thoughts.  Objective: Vitals:   05/16/19 0030 05/16/19 0630 05/16/19 1200 05/16/19 1339  BP: 128/79 121/63 128/82 115/83  Pulse: 77 68 75 85  Resp: 20 18 19 20   Temp: 99.2 F (37.3 C) 98.8 F (37.1 C) 98.5 F (36.9 C) 98.9 F (37.2 C)  TempSrc: Oral Oral Oral Oral  SpO2: 99% 97% 98% 99%  Weight:      Height:        Intake/Output Summary (Last 24 hours) at 05/16/2019 1707 Last data filed at 05/16/2019 1500 Gross per 24 hour  Intake 4368.08 ml  Output 2275 ml  Net 2093.08 ml   Filed Weights   05/13/19 1100 05/14/19  0400  Weight: 79.5 kg 82.3 kg    Examination: General exam: Alert, awake, oriented x 3; denies suicidal ideation or hallucinations.  Patient is no longer hoarse and tolerating diet without problems.  He is afebrile. Respiratory system: Clear to auscultation. Respiratory effort normal. Cardiovascular system:RRR. No  murmurs, rubs, gallops. Gastrointestinal system: Abdomen is nondistended, soft and nontender. No organomegaly or masses felt. Normal bowel sounds heard. Central nervous system: Alert and oriented. No focal neurological deficits. Extremities: No C/C/E, +pedal pulses Skin: No rashes, lesions or ulcers Psychiatry: No agitations.  Following commands appropriately.  Denies harmful thoughts.   Data Reviewed: I have personally reviewed following labs and imaging studies  CBC: Recent Labs  Lab 05/12/19 2155 05/13/19 1005 05/14/19 0449 05/15/19 0556  WBC 10.6* 7.8 6.8 6.5  NEUTROABS 7.5  --   --   --   HGB 14.0 13.8 11.7* 11.6*  HCT 40.2 41.9 36.1* 34.2*  MCV 84.8 90.1 90.9 86.6  PLT 291 188 179 202   Basic Metabolic Panel: Recent Labs  Lab 05/12/19 2155 05/13/19 0517 05/13/19 1005 05/14/19 0449 05/15/19 0556 05/15/19 1958 05/16/19 0502  NA 130* 137  --  139 139  --  137  K 2.9* 4.9  --  3.3* 2.9* 3.4* 4.0  CL 100 111  --  112* 105  --  106  CO2 18* 22  --  21* 24  --  24  GLUCOSE 92 114*  --  103* 107*  --  108*  BUN 15 10  --  8 <5*  --  <5*  CREATININE 1.40* 1.09 1.04 1.03 0.83  --  0.84  CALCIUM 9.4 7.9*  --  7.5* 7.8*  --  8.4*  MG  --   --   --   --  1.8  --   --    GFR: Estimated Creatinine Clearance: 125.5 mL/min (by C-G formula based on SCr of 0.84 mg/dL).   Liver Function Tests: Recent Labs  Lab 05/12/19 2155 05/14/19 0449  AST 32 20  ALT 20 13  ALKPHOS 68 42  BILITOT 1.1 0.8  PROT 7.8 4.9*  ALBUMIN 4.5 2.6*   Recent Labs  Lab 05/12/19 2155  LIPASE 24   Cardiac Enzymes: Recent Labs  Lab 05/12/19 2155 05/13/19 0517  CKTOTAL 875* 2,034*   CBG: Recent Labs  Lab 05/13/19 2049 05/14/19 0027 05/14/19 0405 05/14/19 0737 05/14/19 1106  GLUCAP 139* 71 92 93 94   Lipid Profile: Recent Labs    05/14/19 0449  TRIG 106   Urine analysis:    Component Value Date/Time   COLORURINE YELLOW 01/21/2016 1143   APPEARANCEUR CLEAR 01/21/2016 1143    LABSPEC 1.015 01/21/2016 1143   PHURINE 6.0 01/21/2016 1143   GLUCOSEU NEGATIVE 01/21/2016 1143   HGBUR NEGATIVE 01/21/2016 1143   BILIRUBINUR NEGATIVE 01/21/2016 1143   KETONESUR NEGATIVE 01/21/2016 1143   PROTEINUR NEGATIVE 01/21/2016 1143   NITRITE NEGATIVE 01/21/2016 1143   LEUKOCYTESUR NEGATIVE 01/21/2016 1143    Recent Results (from the past 240 hour(s))  SARS Coronavirus 2 (CEPHEID - Performed in [facility] hospital lab), Hosp Order     Status: None   Collection Time: 05/13/19  4:02 AM   Specimen: Nasopharyngeal Swab  Result Value Ref Range Status   SARS Coronavirus 2 NEGATIVE NEGATIVE Final    Comment: (NOTE) If result is NEGATIVE SARS-CoV-2 target nucleic acids are NOT DETECTED. The SARS-CoV-2 RNA is generally detectable in upper and lower  respiratory specimens during the acute  phase of infection. The lowest  concentration of SARS-CoV-2 viral copies this assay can detect is 250  copies / mL. A negative result does not preclude SARS-CoV-2 infection  and should not be used as the sole basis for treatment or other  patient management decisions.  A negative result may occur with  improper specimen collection / handling, submission of specimen other  than nasopharyngeal swab, presence of viral mutation(s) within the  areas targeted by this assay, and inadequate number of viral copies  (<250 copies / mL). A negative result must be combined with clinical  observations, patient history, and epidemiological information. If result is POSITIVE SARS-CoV-2 target nucleic acids are DETECTED. The SARS-CoV-2 RNA is generally detectable in upper and lower  respiratory specimens dur ing the acute phase of infection.  Positive  results are indicative of active infection with SARS-CoV-2.  Clinical  correlation with patient history and other diagnostic information is  necessary to determine patient infection status.  Positive results do  not rule out bacterial infection or co-infection  with other viruses. If result is PRESUMPTIVE POSTIVE SARS-CoV-2 nucleic acids MAY BE PRESENT.   A presumptive positive result was obtained on the submitted specimen  and confirmed on repeat testing.  While 2019 novel coronavirus  (SARS-CoV-2) nucleic acids may be present in the submitted sample  additional confirmatory testing may be necessary for epidemiological  and / or clinical management purposes  to differentiate between  SARS-CoV-2 and other Sarbecovirus currently known to infect humans.  If clinically indicated additional testing with an alternate test  methodology [id number]) is advised. The SARS-CoV-2 RNA is generally  detectable in upper and lower respiratory sp ecimens during the acute  phase of infection. The expected result is Negative. Fact Sheet for Patients:  BoilerBrush.com.cyhttps://www.fda.gov/media/136312/download Fact Sheet for Healthcare Providers: https://pope.com/https://www.fda.gov/media/136313/download This test is not yet approved or cleared by the [place] FDA and has been authorized for detection and/or diagnosis of SARS-CoV-2 by FDA under an Emergency Use Authorization (EUA).  This EUA will remain in effect (meaning this test can be used) for the duration of the COVID-19 declaration under Section 564(b)(1) of the Act, 21 U.S.C. section 360bbb-3(b)(1), unless the authorization is terminated or revoked sooner. Performed at [facility], [address] [place], [place] [address]   MRSA PCR Screening     Status: None   Collection Time: 05/13/19  8:55 AM   Specimen: Nasal Mucosa; Nasopharyngeal  Result Value Ref Range Status   MRSA by PCR NEGATIVE NEGATIVE Final    Comment:        The GeneXpert MRSA Assay (FDA approved for NASAL specimens only), is one component of a comprehensive MRSA colonization surveillance program. It is not intended to diagnose MRSA infection nor to guide or monitor treatment for MRSA infections. Performed at [facility], [address] [place], [place]  [address]   Culture, blood (Routine X 2) w Reflex to ID Panel     Status: None (Preliminary result)   Collection Time: 05/13/19 10:05 AM   Specimen: BLOOD LEFT ARM  Result Value Ref Range Status   Specimen Description BLOOD LEFT ARM  Final   Special Requests   Final    BOTTLES DRAWN AEROBIC AND ANAEROBIC Blood Culture results may not be optimal due to an inadequate volume of blood received in culture bottles   Culture   Final    NO GROWTH 3 DAYS Performed at [facility], [address] [place], [place] [address]    Report Status PENDING  Incomplete  Culture, blood (Routine X 2) w Reflex to ID Panel     Status: None (Preliminary result)   Collection Time: 05/13/19 10:06 AM   Specimen: BLOOD LEFT ARM  Result Value Ref Range Status   Specimen Description BLOOD LEFT ARM  Final   Special Requests   Final    BOTTLES DRAWN AEROBIC ONLY Blood Culture results may not be optimal due to an inadequate volume of blood received in culture bottles   Culture   Final    NO GROWTH 3 DAYS Performed at [facility], [address] [place], [place] [address]    Report Status PENDING  Incomplete     Radiology Studies: Dg Chest Port 1 View  Result Date: 05/15/2019 CLINICAL DATA:  38 year old male with history of shortness of breath. EXAM: PORTABLE CHEST 1 VIEW COMPARISON:  Chest x-ray 05/14/2019. FINDINGS: Previously noted endotracheal tube and nasogastric tubes have both been removed. Lung volumes are normal. No consolidative airspace disease. No pleural effusions. No pneumothorax. No pulmonary nodule or mass noted. Pulmonary vasculature and the cardiomediastinal silhouette are within normal limits. IMPRESSION: No radiographic evidence of acute cardiopulmonary disease. Electronically Signed   By: Trudie Reedaniel  Entrikin M.D.   On: 05/15/2019 08:20     Scheduled Meds: . Chlorhexidine Gluconate Cloth  6 each Topical Daily  . folic acid  1 mg Oral Daily  . LORazepam  0-4 mg Intravenous Q6H   Followed by  .  [START ON 05/17/2019] LORazepam  0-4 mg Intravenous Q12H  . multivitamin with minerals  1 tablet Oral Daily  . nicotine  21 mg Transdermal Daily  . pantoprazole  40 mg Oral Daily  . thiamine  100 mg Oral Daily   Or  . thiamine  100 mg Intravenous Daily   Continuous Infusions: . 0.9 % NaCl with KCl 40 mEq / L 125 mL/hr (05/16/19 0954)     LOS: 3 days    Time spent: 30 minutes. Vassie Loll.    Lacinda Curvin, MD [facility] Pager [phone]   05/16/2019, 5:07 PM

## 2019-05-16 NOTE — Progress Notes (Signed)
Patient was becoming agitated because he could not leave, even though he was feeling better.  This RN attempted to explain his IVC papers to him. RN called for security and [place] [employer] to come talk with him.  They were able to make him understand exactly why he has IVC papers.  Patient calmed down and was pleasant the rest of the night.

## 2019-05-16 NOTE — Progress Notes (Addendum)
Nsg Discharge Note  Admit Date:  05/12/2019 Discharge date: 05/16/2019   Michael [name] to be D/C'd Home per MD order.  AVS completed.  Copy for chart, and copy for patient signed, and dated. Patient/caregiver able to verbalize understanding.  Discharge Medication: Allergies as of 05/16/2019   No Known Allergies     Medication List    TAKE these medications   acetaminophen 325 MG tablet Commonly known as: TYLENOL Take 2 tablets (650 mg total) by mouth every 6 (six) hours as needed for mild pain or headache (or Fever >/= 101).   multivitamin with minerals Tabs tablet Take 1 tablet by mouth daily. Start taking on: May 17, 2019   nicotine 21 mg/24hr patch Commonly known as: NICODERM CQ - dosed in mg/24 hours Place 1 patch (21 mg total) onto the skin daily. Start taking on: May 17, 2019   pantoprazole 40 MG tablet Commonly known as: PROTONIX Take 1 tablet (40 mg total) by mouth daily. Start taking on: May 17, 2019       Discharge Assessment: Vitals:   05/16/19 1200 05/16/19 1339  BP: 128/82 115/83  Pulse: 75 85  Resp: 19 20  Temp: 98.5 F (36.9 C) 98.9 F (37.2 C)  SpO2: 98% 99%   Skin clean, dry and intact without evidence of skin break down, no evidence of skin tears noted. IV catheter discontinued intact. Site without signs and symptoms of complications - no redness or edema noted at insertion site, patient denies c/o pain - only slight tenderness at site.  Dressing with slight pressure applied.  D/c Instructions-Education: Discharge instructions given to patient/family with verbalized understanding. MD on unit signing release of IVC paperwork to be faxed  D/c education completed with patient/family including follow up instructions, medication list, d/c activities limitations if indicated, with other d/c instructions as indicated by MD - patient able to verbalize understanding, all questions fully answered. Patient instructed to return to ED, call 911,  or call MD for any changes in condition. Patient escorted via [place], and D/C home via private auto.  Loa Socks, RN 05/16/2019 6:46 PM

## 2019-05-16 NOTE — Discharge Summary (Signed)
Physician Discharge Summary  Michael [name] PJK:[id number] DOB: [birth year] DOA: 05/12/2019  PCP: Patient, No Pcp Per  Admit date: 05/12/2019 Discharge date: 05/16/2019  Time spent: 35 minutes  Recommendations for Outpatient Follow-up:  1. Continue assisting with tobacco, alcohol and recreational drugs cessation. 2. Repeat BMET to follow electrolytes and renal function.   Discharge Diagnoses:  Principal Problem:   Severe recurrent major depression without psychotic features ([place]) Active Problems:   Drug overdose, intentional ([place])   Respiratory failure ([place])   Pneumonitis polysubstance abuse GERD  Discharge Condition: stable and improved. Discharge home with instruction to follow with outpatient resources as dictated by psych service.  Diet recommendation: regular diet.  Filed Weights   05/13/19 1100 05/14/19 0400  Weight: 79.5 kg 82.3 kg    History of present illness:  As per H&P written by Dr. Darrick Meigs on 05/13/19 Michael [name]  is a 38 y.o. male, with history of polysubstance abuse was brought to the ED via EMS for agitation.  Patient called 911 with concern for possible heroin overdose.  History is obtained from the ED notes as patient is currently intubated and sedated and unable to provide any history. Patient has been been abusing heroin and crystal meth.  Patient was brought to the ED by EMS.  In the ED he told nursing staff that he just wants to die.  Later patient became agitated, had sonorous respirations, patient became tachypneic and hypoxic with O2 sats in high 80s.  Patient was intubated to protect her airway. Patient will need psychiatric evaluation when he is extubated for suicidal ideation.    Hospital Course:  1-drug overdose -With the use of heroin and crystal meth. -Patient is denying intentional overdose or suicidal ideation currently -Was initially intubated for airway protection; but subsequently self extubated -Respiratory status currently  stable. -Patient clear by psychiatry for discharge. -Patient is medically stable and has been instructed to follow with outpatient resources as instructed by psychiatry.  2-acute respiratory failure: Secondary to drug overdose and aspiration pneumonitis. -Initially treated with Unasyn. -Repeat chest x-ray this morning does not show any evidence of pneumonia -He is currently afebrile, on room air without any shortness of breath. -Continue to monitor off antibiotics.  3-mild rhabdomyolysis -Patient advised to maintain adequate hydration. -non-traumatic.  4-acute kidney injury -Secondary to rhabdomyolysis -After fluid resuscitation renal function is back to normal.   -Patient advised to maintain adequate hydration. -repeat BMET at follow up visit to assess renal function stability.  5-hypokalemia -Repleted within normal limits currently -Magnesium within normal limits. -repeat BMET at follow up visit to assess electrolytes stability.  6-GERD -Continue PPI.  7-tobacco abuse -cessation counseling provided -patient discharge on nicotine patch  8-Polysubstance abuse -cessation counseling provided -no active withdrawal appreciated -discharge with instructions to follow up with outpatient resources as dictated by psychiatry  -multivitamin prescribed to supplement folic acid and thiamine.   Procedures:  See below for x-ray reports.  Consultations:  Psychiatry  Pulmonology   Discharge Exam: Vitals:   05/16/19 1200 05/16/19 1339  BP: 128/82 115/83  Pulse: 75 85  Resp: 19 20  Temp: 98.5 F (36.9 C) 98.9 F (37.2 C)  SpO2: 98% 99%   General exam: Alert, awake, oriented x 3; denies suicidal ideation or hallucinations.  Patient is no longer hoarse and tolerating diet without problems.  He is afebrile. Respiratory system: Clear to auscultation. Respiratory effort normal. Cardiovascular system:RRR. No murmurs, rubs, gallops. Gastrointestinal system: Abdomen is  nondistended, soft and nontender. No organomegaly or  masses felt. Normal bowel sounds heard. Central nervous system: Alert and oriented. No focal neurological deficits. Extremities: No C/C/E, +pedal pulses Skin: No rashes, lesions or ulcers Psychiatry: No agitations.  Following commands appropriately.  Denies harmful thoughts.    Discharge Instructions   Discharge Instructions    Discharge instructions   Complete by: As directed    Take medications as prescribed Follow up with outpatient resources as instructed by psychiatry services. Stop smoking Stop use of recreational drugs Keep yourself well hydrated.     Allergies as of 05/16/2019   No Known Allergies     Medication List    TAKE these medications   acetaminophen 325 MG tablet Commonly known as: TYLENOL Take 2 tablets (650 mg total) by mouth every 6 (six) hours as needed for mild pain or headache (or Fever >/= 101).   multivitamin with minerals Tabs tablet Take 1 tablet by mouth daily. Start taking on: May 17, 2019   nicotine 21 mg/24hr patch Commonly known as: NICODERM CQ - dosed in mg/24 hours Place 1 patch (21 mg total) onto the skin daily. Start taking on: May 17, 2019   pantoprazole 40 MG tablet Commonly known as: PROTONIX Take 1 tablet (40 mg total) by mouth daily. Start taking on: May 17, 2019      No Known Allergies    The results of significant diagnostics from this hospitalization (including imaging, microbiology, ancillary and laboratory) are listed below for reference.    Significant Diagnostic Studies: Dg Chest Port 1 View  Result Date: 05/15/2019 CLINICAL DATA:  38 year old male with history of shortness of breath. EXAM: PORTABLE CHEST 1 VIEW COMPARISON:  Chest x-ray 05/14/2019. FINDINGS: Previously noted endotracheal tube and nasogastric tubes have both been removed. Lung volumes are normal. No consolidative airspace disease. No pleural effusions. No pneumothorax. No pulmonary nodule or  mass noted. Pulmonary vasculature and the cardiomediastinal silhouette are within normal limits. IMPRESSION: No radiographic evidence of acute cardiopulmonary disease. Electronically Signed   By: Trudie Reedaniel  Entrikin M.D.   On: 05/15/2019 08:20   Dg Chest Port 1 View  Result Date: 05/14/2019 CLINICAL DATA:  Respiratory failure. EXAM: PORTABLE CHEST 1 VIEW COMPARISON:  05/13/2019 and prior exams FINDINGS: Cardiomediastinal silhouette is unchanged. An endotracheal tube with tip 2.5 cm above the carina and NG tube within the stomach noted. Bilateral UPPER lung airspace opacities have decreased. No pneumothorax.  No other significant change. IMPRESSION: Decreased bilateral UPPER lung airspace opacities. No other significant change. Electronically Signed   By: Harmon PierJeffrey  Hu M.D.   On: 05/14/2019 08:27   Dg Chest Port 1 View  Result Date: 05/13/2019 CLINICAL DATA:  Intubation. Possible overdose. EXAM: PORTABLE CHEST 1 VIEW COMPARISON:  09/03/2018 FINDINGS: Endotracheal tube tip 4.8 cm from the carina. Enteric tube in place with tip and side-port below the diaphragm in the stomach. Heterogeneous bilateral suprahilar opacities. Heart is normal in size. No large pleural effusion or pneumothorax. No acute osseous abnormalities are seen. IMPRESSION: 1. Endotracheal tube tip 4.8 cm from the carina. Enteric tube in place, tip and side-port below the diaphragm. 2. Heterogeneous bilateral suprahilar opacities may be aspiration or atelectasis. Pulmonary edema felt less likely. Electronically Signed   By: Narda RutherfordMelanie  Sanford M.D.   On: 05/13/2019 03:59    Microbiology: Recent Results (from the past 240 hour(s))  SARS Coronavirus 2 (CEPHEID - Performed in [facility] hospital lab), Hosp Order     Status: None   Collection Time: 05/13/19  4:02 AM   Specimen: Nasopharyngeal Swab  Result Value Ref Range Status   SARS Coronavirus 2 NEGATIVE NEGATIVE Final    Comment: (NOTE) If result is NEGATIVE SARS-CoV-2 target nucleic acids  are NOT DETECTED. The SARS-CoV-2 RNA is generally detectable in upper and lower  respiratory specimens during the acute phase of infection. The lowest  concentration of SARS-CoV-2 viral copies this assay can detect is 250  copies / mL. A negative result does not preclude SARS-CoV-2 infection  and should not be used as the sole basis for treatment or other  patient management decisions.  A negative result may occur with  improper specimen collection / handling, submission of specimen other  than nasopharyngeal swab, presence of viral mutation(s) within the  areas targeted by this assay, and inadequate number of viral copies  (<250 copies / mL). A negative result must be combined with clinical  observations, patient history, and epidemiological information. If result is POSITIVE SARS-CoV-2 target nucleic acids are DETECTED. The SARS-CoV-2 RNA is generally detectable in upper and lower  respiratory specimens dur ing the acute phase of infection.  Positive  results are indicative of active infection with SARS-CoV-2.  Clinical  correlation with patient history and other diagnostic information is  necessary to determine patient infection status.  Positive results do  not rule out bacterial infection or co-infection with other viruses. If result is PRESUMPTIVE POSTIVE SARS-CoV-2 nucleic acids MAY BE PRESENT.   A presumptive positive result was obtained on the submitted specimen  and confirmed on repeat testing.  While 2019 novel coronavirus  (SARS-CoV-2) nucleic acids may be present in the submitted sample  additional confirmatory testing may be necessary for epidemiological  and / or clinical management purposes  to differentiate between  SARS-CoV-2 and other Sarbecovirus currently known to infect humans.  If clinically indicated additional testing with an alternate test  methodology [phone] is advised. The SARS-CoV-2 RNA is generally  detectable in upper and lower respiratory sp ecimens  during the acute  phase of infection. The expected result is Negative. Fact Sheet for Patients:  BoilerBrush.com.cyhttps://www.fda.gov/media/136312/download Fact Sheet for Healthcare Providers: https://pope.com/https://www.fda.gov/media/136313/download This test is not yet approved or cleared by the [place] FDA and has been authorized for detection and/or diagnosis of SARS-CoV-2 by FDA under an Emergency Use Authorization (EUA).  This EUA will remain in effect (meaning this test can be used) for the duration of the COVID-19 declaration under Section 564(b)(1) of the Act, 21 U.S.C. section 360bbb-3(b)(1), unless the authorization is terminated or revoked sooner. Performed at [facility], [address] [place], [place] [address]   MRSA PCR Screening     Status: None   Collection Time: 05/13/19  8:55 AM   Specimen: Nasal Mucosa; Nasopharyngeal  Result Value Ref Range Status   MRSA by PCR NEGATIVE NEGATIVE Final    Comment:        The GeneXpert MRSA Assay (FDA approved for NASAL specimens only), is one component of a comprehensive MRSA colonization surveillance program. It is not intended to diagnose MRSA infection nor to guide or monitor treatment for MRSA infections. Performed at [facility], [address] [place], [place] [address]   Culture, blood (Routine X 2) w Reflex to ID Panel     Status: None (Preliminary result)   Collection Time: 05/13/19 10:05 AM   Specimen: BLOOD LEFT ARM  Result Value Ref Range Status   Specimen Description BLOOD LEFT ARM  Final   Special Requests   Final    BOTTLES DRAWN AEROBIC AND ANAEROBIC Blood Culture results  may not be optimal due to an inadequate volume of blood received in culture bottles   Culture   Final    NO GROWTH 3 DAYS Performed at [facility], [address] [place], [place] [address]    Report Status PENDING  Incomplete  Culture, blood (Routine X 2) w Reflex to ID Panel     Status: None (Preliminary result)   Collection Time: 05/13/19 10:06 AM    Specimen: BLOOD LEFT ARM  Result Value Ref Range Status   Specimen Description BLOOD LEFT ARM  Final   Special Requests   Final    BOTTLES DRAWN AEROBIC ONLY Blood Culture results may not be optimal due to an inadequate volume of blood received in culture bottles   Culture   Final    NO GROWTH 3 DAYS Performed at [facility], [address] [place], [place] [address]    Report Status PENDING  Incomplete     Labs: Basic Metabolic Panel: Recent Labs  Lab 05/12/19 2155 05/13/19 0517 05/13/19 1005 05/14/19 0449 05/15/19 0556 05/15/19 1958 05/16/19 0502  NA 130* 137  --  139 139  --  137  K 2.9* 4.9  --  3.3* 2.9* 3.4* 4.0  CL 100 111  --  112* 105  --  106  CO2 18* 22  --  21* 24  --  24  GLUCOSE 92 114*  --  103* 107*  --  108*  BUN 15 10  --  8 <5*  --  <5*  CREATININE 1.40* 1.09 1.04 1.03 0.83  --  0.84  CALCIUM 9.4 7.9*  --  7.5* 7.8*  --  8.4*  MG  --   --   --   --  1.8  --   --    Liver Function Tests: Recent Labs  Lab 05/12/19 2155 05/14/19 0449  AST 32 20  ALT 20 13  ALKPHOS 68 42  BILITOT 1.1 0.8  PROT 7.8 4.9*  ALBUMIN 4.5 2.6*   Recent Labs  Lab 05/12/19 2155  LIPASE 24   CBC: Recent Labs  Lab 05/12/19 2155 05/13/19 1005 05/14/19 0449 05/15/19 0556  WBC 10.6* 7.8 6.8 6.5  NEUTROABS 7.5  --   --   --   HGB 14.0 13.8 11.7* 11.6*  HCT 40.2 41.9 36.1* 34.2*  MCV 84.8 90.1 90.9 86.6  PLT 291 188 179 202   Cardiac Enzymes: Recent Labs  Lab 05/12/19 2155 05/13/19 0517  CKTOTAL 875* 2,034*    CBG: Recent Labs  Lab 05/13/19 2049 05/14/19 0027 05/14/19 0405 05/14/19 0737 05/14/19 1106  GLUCAP 139* 71 92 93 94    Signed:  Vassie Lollarlos Dionicia Cerritos MD.  [facility] 05/16/2019, 6:22 PM

## 2019-05-16 NOTE — [facility] Counselor (Signed)
Pt was reassessed this morning.  He continues to deny suicidal ideation, homicidal ideation.  He acknowledged overdosing, but stated that it was an accident.  He stated also that he wants to go home.

## 2019-05-16 NOTE — Consult Note (Signed)
Telepsych Consultation   Reason for Consult:  Reported Overdose Referring Physician: Dr. Gwenlyn PerkingMadera Location of Patient: [facility] [address] Location of Provider: Behavioral Health TTS Department  Patient Identification: Michael HewRobert Jackson Crichlow [name] MRN:  [id number] Principal Diagnosis: Severe recurrent major depression without psychotic features [facility](HCC) Diagnosis:  Principal Problem:   Severe recurrent major depression without psychotic features [facility](HCC) Active Problems:   Drug overdose, intentional (HCC)   Total Time spent with patient: 30 minutes  Subjective:   Michael [name] is a 38 y.o. male patient admitted with reported overdose. As per patient he mixed drugs together " I did soemthing I knew I should not have and when I went and laid down that night my chest started hurting and my arm went numb. I said Oh shit I better call the ambulance. If I made any suicidal statements this was because I was out of my mind. I understand that I almost died this time. If I am admitted I am going to have more of a difficult time then you just letting me go home. This was not a suicide attempt. I plan to go to rehab in [place] it helped me out a lot before. "   HPI:  Michael [name] is an 38 y.o. male with a history of polysubstance abuse who was brought to the emergency department by EMS because of agitation. Patient reported,"I overdosed" but would not provide further information. Per note in chart, apparently, he had called 911 with concern that he had overdosed on heroin. He was able to give some history stated that he had been abusing heroin and crystal meth when he was in the emergency department. He told the nursing staff that he wanted to die. Patient denied suicidal / homicidal ideations during the assessment. Report he just overdosed and he was not trying to kill himself. Report he does not want substance abuse treatment and just wants to return home stating, "whatever I was going through I  am over it now. I want to thank EMS for saving my life but I am ready to go home now." Denied auditory / visual hallucinations then asked, "why is my hand 4x the normal size." Patient uncooperative refusing to answer questions. Patient admitted to the hospital 08/2018 overdose. Information gather from previous visits. Patient slurred when he spoke and did not make eye contact.  Past Psychiatric History:MDD,Polysubstance abuse  Reports prior psychiatric admission in June/2019, for depression, anxiety, suicidal ideations, substance abuse. Reports history of chronic depression and reports a suicide attempt by hanging self at age 38. Describes intermittent auditory hallucinations, such as hearing name being called . Does not currently endorse mania or hypomania.  Denies history of violence  Substance Abuse History in the last 12 months:  Denies history of alcohol dependence, reports history of opiate ( heroin) and cocaine ( crack) dependencies. Reports occasional Xanax abuse, but not regularly.  Denies cannabis abuse .  Longest period of sobriety 8 months. Consequences of Substance Abuse: Relationship, family stressors Previous Psychotropic Medications:  States he has been on Suboxone x 1 month.  States he has not been on any psychiatric medications recently. States he was on an antidepressant " a long time ago" but does not remember name .   Risk to Self: Suicidal Ideation: No-Not Currently/Within Last 6 Months(pt denied feeling suicidal to TTS assessor, admitted to EMS ) Suicidal Intent: No Is patient at risk for suicide?: No Suicidal Plan?: No Access to Means: No What has been your use  of drugs/alcohol within the last 12 months?: Meth, Heroin, Crack Cocaine  How many times?: 0 Other Self Harm Risks: poly-substance use  Triggers for Past Attempts: None known Intentional Self Injurious Behavior: Damaging(poly-substance use ) Comment - Self Injurious Behavior: poly-substance use  Risk to  Others: Homicidal Ideation: No Thoughts of Harm to Others: No Current Homicidal Intent: No Current Homicidal Plan: No Access to Homicidal Means: No Identified Victim: n/a History of harm to others?: No Assessment of Violence: None Noted Violent Behavior Description: None noted  Does patient have access to weapons?: No Criminal Charges Pending?: No Does patient have a court date: No Prior Inpatient Therapy: Prior Inpatient Therapy: Yes Prior Therapy Dates: 04/2018, 03/2018, 2001 Prior Therapy Facilty/Provider(s): [facility], [place], [facility]  Reason for Treatment: MDD suicidal, SA  Prior Outpatient Therapy: Prior Outpatient Therapy: Yes Prior Therapy Dates: 2019 Prior Therapy Facilty/Provider(s): [facility] Reason for Treatment: Heroin  Does patient have an ACCT team?: No Does patient have Intensive In-House Services?  : No Does patient have [facility] services? : No Does patient have P4CC services?: No  Past Medical History:  Past Medical History:  Diagnosis Date  . Polysubstance abuse ([place])   . Sciatica   . Shoulder dislocation     Past Surgical History:  Procedure Laterality Date  . ELBOW FRACTURE SURGERY    Family History: father passed away before patient was born, in a MVA. Patient had been living with mother prior to admission . Has two brothers.  Family Psychiatric  History: Reports history of depression in extended family, a maternal uncle committed suicide, father had history of substance use disorder.  Social History:  Social History   Substance and Sexual Activity  Alcohol Use Yes   Comment: "rarely"     Social History   Substance and Sexual Activity  Drug Use Yes  . Types: IV, Benzodiazepines   Comment: Heroin, Crack yesterday    Social History   Socioeconomic History  . Marital status: Single    Spouse name: Not on file  . Number of children: Not on file  . Years of education: Not on file  . Highest education level: Not on file   Occupational History  . Not on file  Social Needs  . Financial resource strain: Not on file  . Food insecurity    Worry: Not on file    Inability: Not on file  . Transportation needs    Medical: Not on file    Non-medical: Not on file  Tobacco Use  . Smoking status: Current Every Day Smoker    Packs/day: 1.00    Types: Cigarettes  . Smokeless tobacco: Never Used  . Tobacco comment: Patient refused smoking cessation education  Substance and Sexual Activity  . Alcohol use: Yes    Comment: "rarely"  . Drug use: Yes    Types: IV, Benzodiazepines    Comment: Heroin, Crack yesterday  . Sexual activity: Yes    Birth control/protection: None  Lifestyle  . Physical activity    Days per week: Not on file    Minutes per session: Not on file  . Stress: Not on file  Relationships  . Social Herbalist on phone: Not on file    Gets together: Not on file    Attends religious service: Not on file    Active member of club or organization: Not on file    Attends meetings of clubs or organizations: Not on file    Relationship status: Not  on file  Other Topics Concern  . Not on file  Social History Narrative  . Not on file   Additional Social History:    Allergies:  No Known Allergies  Labs:  Results for orders placed or performed during the hospital encounter of 05/12/19 (from the past 48 hour(s))  CBC     Status: Abnormal   Collection Time: 05/15/19  5:56 AM  Result Value Ref Range   WBC 6.5 4.0 - 10.5 K/uL   RBC 3.95 (L) 4.22 - 5.81 MIL/uL   Hemoglobin 11.6 (L) 13.0 - 17.0 g/dL   HCT 75.634.2 (L) 43.339.0 - 29.552.0 %   MCV 86.6 80.0 - 100.0 fL   MCH 29.4 26.0 - 34.0 pg   MCHC 33.9 30.0 - 36.0 g/dL   RDW 18.811.8 41.611.5 - 60.615.5 %   Platelets 202 150 - 400 K/uL   nRBC 0.0 0.0 - 0.2 %    Comment: Performed at [facility], [address] [place], [place] [address]  Basic metabolic panel     Status: Abnormal   Collection Time: 05/15/19  5:56 AM  Result Value Ref Range   Sodium  139 135 - 145 mmol/L   Potassium 2.9 (L) 3.5 - 5.1 mmol/L   Chloride 105 98 - 111 mmol/L   CO2 24 22 - 32 mmol/L   Glucose, Bld 107 (H) 70 - 99 mg/dL   BUN <5 (L) 6 - 20 mg/dL   Creatinine, Ser 1.090.83 0.61 - 1.24 mg/dL   Calcium 7.8 (L) 8.9 - 10.3 mg/dL   GFR calc non Af Amer >60 >60 mL/min   GFR calc Af Amer >60 >60 mL/min   Anion gap 10 5 - 15    Comment: Performed at [facility], [address] [place], [place] [address]  Magnesium     Status: None   Collection Time: 05/15/19  5:56 AM  Result Value Ref Range   Magnesium 1.8 1.7 - 2.4 mg/dL    Comment: Performed at [facility], [address] [place], [place] [address]  Potassium     Status: Abnormal   Collection Time: 05/15/19  7:58 PM  Result Value Ref Range   Potassium 3.4 (L) 3.5 - 5.1 mmol/L    Comment: Performed at [facility], [address] [place], [place] [address]  Basic metabolic panel     Status: Abnormal   Collection Time: 05/16/19  5:02 AM  Result Value Ref Range   Sodium 137 135 - 145 mmol/L   Potassium 4.0 3.5 - 5.1 mmol/L   Chloride 106 98 - 111 mmol/L   CO2 24 22 - 32 mmol/L   Glucose, Bld 108 (H) 70 - 99 mg/dL   BUN <5 (L) 6 - 20 mg/dL   Creatinine, Ser 0.620.84 0.61 - 1.24 mg/dL   Calcium 8.4 (L) 8.9 - 10.3 mg/dL   GFR calc non Af Amer >60 >60 mL/min   GFR calc Af Amer >60 >60 mL/min   Anion gap 7 5 - 15    Comment: Performed at [facility], [address] [place], [place] [address]    Medications:  Current Facility-Administered Medications  Medication Dose Route Frequency Provider Last Rate Last Dose  . 0.9 % NaCl with KCl 40 mEq / L  infusion   Intravenous Continuous Erick BlinksMemon, Jehanzeb, MD 125 mL/hr at 05/16/19 0954 125 mL/hr at 05/16/19 0954  . acetaminophen (TYLENOL) tablet 650 mg  650 mg Oral Q6H PRN Vassie LollMadera, Carlos, MD  Or  . acetaminophen (TYLENOL) suppository 650 mg  650 mg Rectal Q6H PRN Vassie LollMadera, Carlos, MD      . Chlorhexidine Gluconate Cloth 2 % PADS 6 each  6 each Topical Daily Vassie LollMadera,  Carlos, MD   6 each at 05/16/19 [phone]  . folic acid (FOLVITE) tablet 1 mg  1 mg Oral Daily Meredeth IdeLama, Gagan S, MD   1 mg at 05/16/19 0855  . ipratropium-albuterol (DUONEB) 0.5-2.5 (3) MG/3ML nebulizer solution 3 mL  3 mL Nebulization Q6H PRN Vassie LollMadera, Carlos, MD      . LORazepam (ATIVAN) injection 0-4 mg  0-4 mg Intravenous Q6H Meredeth IdeLama, Gagan S, MD   1 mg at 05/16/19 0554   Followed by  . [START ON 05/17/2019] LORazepam (ATIVAN) injection 0-4 mg  0-4 mg Intravenous Q12H Lama, Sarina IllGagan S, MD      . LORazepam (ATIVAN) tablet 1 mg  1 mg Oral Q6H PRN Meredeth IdeLama, Gagan S, MD       Or  . LORazepam (ATIVAN) injection 1 mg  1 mg Intravenous Q6H PRN Meredeth IdeLama, Gagan S, MD   1 mg at 05/15/19 1406  . multivitamin with minerals tablet 1 tablet  1 tablet Oral Daily Meredeth IdeLama, Gagan S, MD   1 tablet at 05/16/19 0855  . nicotine (NICODERM CQ - dosed in mg/24 hours) patch 21 mg  21 mg Transdermal Daily Blount, Xenia T, NP   21 mg at 05/16/19 0855  . ondansetron (ZOFRAN) tablet 4 mg  4 mg Oral Q6H PRN Vassie LollMadera, Carlos, MD       Or  . ondansetron [facility](ZOFRAN) injection 4 mg  4 mg Intravenous Q6H PRN Vassie LollMadera, Carlos, MD      . pantoprazole (PROTONIX) EC tablet 40 mg  40 mg Oral Daily Vassie LollMadera, Carlos, MD   40 mg at 05/16/19 0855  . thiamine (VITAMIN B-1) tablet 100 mg  100 mg Oral Daily Meredeth IdeLama, Gagan S, MD   100 mg at 05/16/19 [id number]   Or  . thiamine (B-1) injection 100 mg  100 mg Intravenous Daily Meredeth IdeLama, Gagan S, MD        Musculoskeletal: Strength & Muscle Tone: within normal limits Gait & Station: normal Patient leans: N/A  Psychiatric Specialty Exam: Physical Exam  ROS  Blood pressure 115/83, pulse 85, temperature 98.9 F (37.2 C), temperature source Oral, resp. rate 20, height 5\' 10"  (1.778 m), weight 82.3 kg, SpO2 99 %.Body mass index is 26.03 kg/m.  General Appearance: Fairly Groomed  Eye Contact:  Fair  Speech:  Clear and Coherent and Normal Rate  Volume:  Normal  Mood:  Euthymic  Affect:  Appropriate and Congruent  Thought Process:   Coherent, Linear and Descriptions of Associations: Intact  Orientation:  Full (Time, Place, and Person)  Thought Content:  WDL  Suicidal Thoughts:  No  Homicidal Thoughts:  No  Memory:  Immediate;   Fair Recent;   Fair  Judgement:  Intact  Insight:  Present  Psychomotor Activity:  Normal  Concentration:  Concentration: Fair and Attention Span: Fair  Recall:  [employer]Fair  Fund of Knowledge:  Good  Language:  Good  Akathisia:  No  Handed:  Right  AIMS (if indicated):     Assets:  Communication Skills Desire for Improvement Financial Resources/Insurance Leisure Time Physical Health Resilience Social Support  ADL's:  Intact  Cognition:  WNL  Sleep:        Treatment Plan Summary: Daily contact with patient to assess and evaluate symptoms and progress in treatment and Medication  management Will discharge with outpatient resources for [facility]. Patient is adamant this was not a suicide attempt. He does regret mixing substances, and reports willingness and readiness to change.   Disposition: No evidence of imminent risk to self or others at present.   Patient does not meet criteria for psychiatric inpatient admission. Supportive therapy provided about ongoing stressors. Refer to IOP. Discussed crisis plan, support from social network, calling 911, coming to the Emergency Department, and calling Suicide Hotline.  This service was provided via telemedicine using a 2-way, interactive audio and video technology.  Names of all persons participating in this telemedicine service and their role in this encounter. Name: Caryn Bee Role: FNP-BC  Name: Verl Blalock Role: Patient    Maryagnes Amos, FNP 05/16/2019 4:56 PM

## 2019-05-18 LAB — CULTURE, BLOOD (ROUTINE X 2)
Culture: NO GROWTH
Culture: NO GROWTH

## 2019-05-22 ENCOUNTER — Emergency Department (HOSPITAL_COMMUNITY): Payer: Self-pay

## 2019-05-22 ENCOUNTER — Other Ambulatory Visit: Payer: Self-pay

## 2019-05-22 ENCOUNTER — Emergency Department (HOSPITAL_COMMUNITY)
Admission: EM | Admit: 2019-05-22 | Discharge: 2019-05-25 | Disposition: A | Discharge status: Home / Self Care | Payer: Self-pay | Attending: Emergency Medicine | Admitting: Emergency Medicine

## 2019-05-22 ENCOUNTER — Encounter (HOSPITAL_COMMUNITY): Payer: Self-pay | Admitting: Emergency Medicine

## 2019-05-22 DIAGNOSIS — F1910 Other psychoactive substance abuse, uncomplicated: Secondary | ICD-10-CM | POA: Insufficient documentation

## 2019-05-22 DIAGNOSIS — F1110 Opioid abuse, uncomplicated: Secondary | ICD-10-CM | POA: Insufficient documentation

## 2019-05-22 DIAGNOSIS — F1310 Sedative, hypnotic or anxiolytic abuse, uncomplicated: Secondary | ICD-10-CM | POA: Insufficient documentation

## 2019-05-22 DIAGNOSIS — F17210 Nicotine dependence, cigarettes, uncomplicated: Secondary | ICD-10-CM | POA: Insufficient documentation

## 2019-05-22 DIAGNOSIS — F329 Major depressive disorder, single episode, unspecified: Secondary | ICD-10-CM | POA: Insufficient documentation

## 2019-05-22 DIAGNOSIS — I10 Essential (primary) hypertension: Secondary | ICD-10-CM | POA: Insufficient documentation

## 2019-05-22 DIAGNOSIS — R079 Chest pain, unspecified: Secondary | ICD-10-CM | POA: Insufficient documentation

## 2019-05-22 DIAGNOSIS — R45851 Suicidal ideations: Secondary | ICD-10-CM | POA: Insufficient documentation

## 2019-05-22 DIAGNOSIS — R200 Anesthesia of skin: Secondary | ICD-10-CM | POA: Insufficient documentation

## 2019-05-22 DIAGNOSIS — F32A Depression, unspecified: Secondary | ICD-10-CM

## 2019-05-22 NOTE — ED Triage Notes (Signed)
Pt C/O chest pain that started today. Pt states he snorted heroin around 1900 tonight.

## 2019-05-23 LAB — RAPID URINE DRUG SCREEN, HOSP PERFORMED
Amphetamines: NOT DETECTED
Barbiturates: NOT DETECTED
Benzodiazepines: NOT DETECTED
Cocaine: NOT DETECTED
Opiates: POSITIVE — AB
Tetrahydrocannabinol: NOT DETECTED

## 2019-05-23 LAB — CBC WITH DIFFERENTIAL/PLATELET
Abs Immature Granulocytes: 0.01 10*3/uL (ref 0.00–0.07)
Basophils Absolute: 0 10*3/uL (ref 0.0–0.1)
Basophils Relative: 1 %
Eosinophils Absolute: 0.2 10*3/uL (ref 0.0–0.5)
Eosinophils Relative: 4 %
HCT: 37.7 % — ABNORMAL LOW (ref 39.0–52.0)
Hemoglobin: 12.4 g/dL — ABNORMAL LOW (ref 13.0–17.0)
Immature Granulocytes: 0 %
Lymphocytes Relative: 44 %
Lymphs Abs: 2.7 10*3/uL (ref 0.7–4.0)
MCH: 29.5 pg (ref 26.0–34.0)
MCHC: 32.9 g/dL (ref 30.0–36.0)
MCV: 89.5 fL (ref 80.0–100.0)
Monocytes Absolute: 0.4 10*3/uL (ref 0.1–1.0)
Monocytes Relative: 7 %
Neutro Abs: 2.7 10*3/uL (ref 1.7–7.7)
Neutrophils Relative %: 44 %
Platelets: 355 10*3/uL (ref 150–400)
RBC: 4.21 MIL/uL — ABNORMAL LOW (ref 4.22–5.81)
RDW: 12.6 % (ref 11.5–15.5)
WBC: 6.1 10*3/uL (ref 4.0–10.5)
nRBC: 0 % (ref 0.0–0.2)

## 2019-05-23 LAB — COMPREHENSIVE METABOLIC PANEL
ALT: 17 U/L (ref 0–44)
AST: 16 U/L (ref 15–41)
Albumin: 3.3 g/dL — ABNORMAL LOW (ref 3.5–5.0)
Alkaline Phosphatase: 45 U/L (ref 38–126)
Anion gap: 6 (ref 5–15)
BUN: 8 mg/dL (ref 6–20)
CO2: 26 mmol/L (ref 22–32)
Calcium: 8.5 mg/dL — ABNORMAL LOW (ref 8.9–10.3)
Chloride: 104 mmol/L (ref 98–111)
Creatinine, Ser: 0.74 mg/dL (ref 0.61–1.24)
GFR calc Af Amer: >60 mL/min (ref >60)
GFR calc non Af Amer: >60 mL/min (ref >60)
Glucose, Bld: 99 mg/dL (ref 70–99)
Potassium: 3.8 mmol/L (ref 3.5–5.1)
Sodium: 136 mmol/L (ref 135–145)
Total Bilirubin: 0.3 mg/dL (ref 0.3–1.2)
Total Protein: 6.3 g/dL — ABNORMAL LOW (ref 6.5–8.1)

## 2019-05-23 LAB — ACETAMINOPHEN LEVEL: Acetaminophen (Tylenol), Serum: <10 ug/mL — ABNORMAL LOW (ref 10–30)

## 2019-05-23 LAB — ETHANOL: Alcohol, Ethyl (B): <10 mg/dL (ref <10)

## 2019-05-23 LAB — SALICYLATE LEVEL: Salicylate Lvl: <7 mg/dL (ref 2.8–30.0)

## 2019-05-23 MED ORDER — CLONIDINE HCL 0.1 MG PO TABS
0.1000 mg | ORAL_TABLET | Freq: Four times a day (QID) | ORAL | Status: DC
  Administered 2019-05-23 – 2019-05-24 (×6): 0.1 mg via ORAL
  Filled 2019-05-23 (×6): qty 1

## 2019-05-23 MED ORDER — NICOTINE 21 MG/24HR TD PT24
21.0000 mg | MEDICATED_PATCH | Freq: Once | TRANSDERMAL | Status: AC
  Administered 2019-05-23: 21 mg via TRANSDERMAL
  Filled 2019-05-23: qty 1

## 2019-05-23 MED ORDER — CLONIDINE HCL 0.1 MG PO TABS: 0.1000 mg | ORAL_TABLET | Freq: Every day | ORAL | Status: DC

## 2019-05-23 MED ORDER — DICYCLOMINE HCL 20 MG PO TABS
20.0000 mg | ORAL_TABLET | Freq: Four times a day (QID) | ORAL | Status: DC | PRN
  Filled 2019-05-23: qty 1

## 2019-05-23 MED ORDER — CLONIDINE HCL 0.1 MG PO TABS: 0.1000 mg | ORAL_TABLET | Freq: Two times a day (BID) | ORAL | Status: DC

## 2019-05-23 MED ORDER — NAPROXEN 250 MG PO TABS: 500.0000 mg | ORAL_TABLET | Freq: Two times a day (BID) | ORAL | Status: DC | PRN

## 2019-05-23 MED ORDER — HYDROXYZINE HCL 25 MG PO TABS
25.0000 mg | ORAL_TABLET | Freq: Four times a day (QID) | ORAL | Status: DC | PRN
  Administered 2019-05-23 – 2019-05-24 (×2): 25 mg via ORAL
  Filled 2019-05-23 (×2): qty 1

## 2019-05-23 MED ORDER — LOPERAMIDE HCL 2 MG PO CAPS: 2.0000 mg | ORAL_CAPSULE | ORAL | Status: DC | PRN

## 2019-05-23 MED ORDER — DICYCLOMINE HCL 10 MG PO CAPS: 20.0000 mg | ORAL_CAPSULE | Freq: Four times a day (QID) | ORAL | Status: DC | PRN

## 2019-05-23 MED ORDER — METHOCARBAMOL 500 MG PO TABS: 1000.0000 mg | ORAL_TABLET | Freq: Four times a day (QID) | ORAL | Status: DC | PRN

## 2019-05-23 MED ORDER — ONDANSETRON 4 MG PO TBDP: 4.0000 mg | ORAL_TABLET | Freq: Four times a day (QID) | ORAL | Status: DC | PRN

## 2019-05-23 NOTE — BH Assessment (Signed)
Per Patriciaann Clan, PA - patient meets criteria for inpt hospitalization.  No appropriate beds at [facility].  TTS will refer out to other facilities.

## 2019-05-23 NOTE — ED Provider Notes (Signed)
[facility] EMERGENCY DEPARTMENT Provider Note   CSN: [id number] Arrival date & time: 05/22/19  2208  Time seen 11:30 PM  History   Chief Complaint Chief Complaint  Patient presents with  . Chest Pain  . Anxiety  . Addiction Problem    HPI Michael [name] is a 38 y.o. male.     HPI patient was discharged from the hospital on July 26 after a heroin overdose that required intubation to protect his airway.  Patient states they offered to have him admitted to inpatient psych but he had refused.  He is here tonight complaining of chest pains.  He states he gets shooting pains over the last 2 to 3 days to start in his left chest, shoot into his right chest, and then down into his left arm and it last for few seconds.  He states he has had some left arm numbness for few days that waxes and wanes.  He is left-handed.  He states sometimes he drops things and sometimes he does not.  He denies any neck pain.  He complains of a lot of left shoulder pain especially when he abducts his arm but he denies any known injury.  He describes it as a squeezing sensation in his shoulder.  He denies shortness of breath, nausea, or vomiting.  He denies any diaphoresis.  He states his last heroin was 7 PM tonight and that did make the chest pains worse.  Patient then makes a lot of statements such as "I am going to die if I do not get off the heroin".  He states "I am not happy with myself".  When I ask him if he is thinking of hurting himself he states "I would not put it past me".  However he will not answer the question if he is suicidal.  When asked what he would do to harm himself he states "I would do anything to get me out of the situation".  He states he is depressed.  He has been treated for depression before and has a possible diagnosis of bipolar.  He states he last did detox about a year ago and was very good about going to [facility] however a few months ago he relapsed and quit going.  He denies  having any police charges.  PCP Patient, No Pcp Per   Past Medical History:  Diagnosis Date  . Polysubstance abuse ([place])   . Sciatica   . Shoulder dislocation     Patient Active Problem List   Diagnosis Date Noted  . Respiratory failure ([place])   . Pneumonitis   . Drug overdose, intentional ([place]) 05/13/2019  . Severe recurrent major depression without psychotic features ([place]) 09/06/2018  . AMS (altered mental status)   . Essential hypertension   . Gastroesophageal reflux disease   . Opioid dependence with opioid-induced mood disorder ([place])   . Drug overdose 09/03/2018  . OCD (obsessive compulsive disorder) 04/13/2018  . Major depressive disorder, recurrent severe without psychotic features ([place]) 04/06/2018  . Cannabis use disorder, moderate, dependence ([place]) 04/06/2018  . Cocaine use disorder, moderate, dependence ([place]) 04/06/2018  . Sedative, hypnotic or anxiolytic use disorder, severe, dependence ([place]) 04/06/2018  . Tobacco abuse 04/06/2018    Past Surgical History:  Procedure Laterality Date  . ELBOW FRACTURE SURGERY          Home Medications    Prior to Admission medications   Medication Sig Start Date End Date Taking? Authorizing Provider  Multiple Vitamin (MULTIVITAMIN  WITH MINERALS) TABS tablet Take 1 tablet by mouth daily. 05/17/19   Vassie LollMadera, Carlos, MD  nicotine (NICODERM CQ - DOSED IN MG/24 HOURS) 21 mg/24hr patch Place 1 patch (21 mg total) onto the skin daily. 05/17/19   Vassie LollMadera, Carlos, MD  pantoprazole (PROTONIX) 40 MG tablet Take 1 tablet (40 mg total) by mouth daily. 05/17/19   Vassie LollMadera, Carlos, MD    Family History No family history on file.  Social History Social History   Tobacco Use  . Smoking status: Current Every Day Smoker    Packs/day: 1.00    Types: Cigarettes  . Smokeless tobacco: Never Used  . Tobacco comment: Patient refused smoking cessation education  Substance Use Topics  . Alcohol use: Yes    Comment: "rarely"  . Drug use: Yes     Types: IV, Benzodiazepines    Comment: Heroin, Crack yesterday  employed   Allergies   Patient has no known allergies.   Review of Systems Review of Systems  All other systems reviewed and are negative.    Physical Exam Updated Vital Signs BP 122/85   Pulse 73   Temp 98.2 F (36.8 C) (Oral)   Resp 17   Ht 5\' 6"  (1.676 m)   Wt 77.1 kg   SpO2 99%   BMI 27.44 kg/m   Physical Exam Vitals signs and nursing note reviewed.  Constitutional:      General: He is not in acute distress.    Appearance: Normal appearance. He is well-developed. He is not ill-appearing or toxic-appearing.  HENT:     Head: Normocephalic and atraumatic.     Right Ear: External ear normal.     Left Ear: External ear normal.     Nose: Nose normal. No mucosal edema or rhinorrhea.     Mouth/Throat:     Mouth: Mucous membranes are moist.     Dentition: No dental abscesses.     Pharynx: No uvula swelling.  Eyes:     Extraocular Movements: Extraocular movements intact.     Conjunctiva/sclera: Conjunctivae normal.     Pupils: Pupils are equal, round, and reactive to light.  Neck:     Musculoskeletal: Full passive range of motion without pain, normal range of motion and neck supple.  Cardiovascular:     Rate and Rhythm: Normal rate and regular rhythm.     Heart sounds: Normal heart sounds. No murmur. No friction rub. No gallop.   Pulmonary:     Effort: Pulmonary effort is normal. No respiratory distress.     Breath sounds: Normal breath sounds. No wheezing, rhonchi or rales.  Chest:     Chest wall: No tenderness or crepitus.  Abdominal:     General: Abdomen is flat. Bowel sounds are normal. There is no distension.     Palpations: Abdomen is soft.     Tenderness: There is no abdominal tenderness. There is no guarding or rebound.  Musculoskeletal: Normal range of motion.        General: No tenderness.     Comments: Moves all extremities well.  Patient complains of a lot of pain in his left shoulder  but it is not painful to palpation.  He states abduction makes it hurt.  Skin:    General: Skin is warm and dry.     Coloration: Skin is not pale.     Findings: No erythema or rash.  Neurological:     General: No focal deficit present.     Mental Status: He is alert  and oriented to person, place, and time.     Cranial Nerves: No cranial nerve deficit.  Psychiatric:        Mood and Affect: Mood normal. Mood is not anxious.        Speech: Speech normal.        Behavior: Behavior normal.        Thought Content: Thought content normal.     Comments: Possibly suicidal      ED Treatments / Results  Labs (all labs ordered are listed, but only abnormal results are displayed) Results for orders placed or performed during the hospital encounter of 05/22/19  Comprehensive metabolic panel  Result Value Ref Range   Sodium 136 135 - 145 mmol/L   Potassium 3.8 3.5 - 5.1 mmol/L   Chloride 104 98 - 111 mmol/L   CO2 26 22 - 32 mmol/L   Glucose, Bld 99 70 - 99 mg/dL   BUN 8 6 - 20 mg/dL   Creatinine, Ser 4.250.74 0.61 - 1.24 mg/dL   Calcium 8.5 (L) 8.9 - 10.3 mg/dL   Total Protein 6.3 (L) 6.5 - 8.1 g/dL   Albumin 3.3 (L) 3.5 - 5.0 g/dL   AST 16 15 - 41 U/L   ALT 17 0 - 44 U/L   Alkaline Phosphatase 45 38 - 126 U/L   Total Bilirubin 0.3 0.3 - 1.2 mg/dL   GFR calc non Af Amer >60 >60 mL/min   GFR calc Af Amer >60 >60 mL/min   Anion gap 6 5 - 15  Acetaminophen level  Result Value Ref Range   Acetaminophen (Tylenol), Serum <10 (L) 10 - 30 ug/mL  Salicylate level  Result Value Ref Range   Salicylate Lvl <7.0 2.8 - 30.0 mg/dL  Ethanol  Result Value Ref Range   Alcohol, Ethyl (B) <10 <10 mg/dL  CBC with Differential  Result Value Ref Range   WBC 6.1 4.0 - 10.5 K/uL   RBC 4.21 (L) 4.22 - 5.81 MIL/uL   Hemoglobin 12.4 (L) 13.0 - 17.0 g/dL   HCT 95.637.7 (L) 38.739.0 - 56.452.0 %   MCV 89.5 80.0 - 100.0 fL   MCH 29.5 26.0 - 34.0 pg   MCHC 32.9 30.0 - 36.0 g/dL   RDW 33.212.6 95.111.5 - 88.415.5 %   Platelets 355  150 - 400 K/uL   nRBC 0.0 0.0 - 0.2 %   Neutrophils Relative % 44 %   Neutro Abs 2.7 1.7 - 7.7 K/uL   Lymphocytes Relative 44 %   Lymphs Abs 2.7 0.7 - 4.0 K/uL   Monocytes Relative 7 %   Monocytes Absolute 0.4 0.1 - 1.0 K/uL   Eosinophils Relative 4 %   Eosinophils Absolute 0.2 0.0 - 0.5 K/uL   Basophils Relative 1 %   Basophils Absolute 0.0 0.0 - 0.1 K/uL   Immature Granulocytes 0 %   Abs Immature Granulocytes 0.01 0.00 - 0.07 K/uL  Urine rapid drug screen (hosp performed)  Result Value Ref Range   Opiates POSITIVE (A) NONE DETECTED   Cocaine NONE DETECTED NONE DETECTED   Benzodiazepines NONE DETECTED NONE DETECTED   Amphetamines NONE DETECTED NONE DETECTED   Tetrahydrocannabinol NONE DETECTED NONE DETECTED   Barbiturates NONE DETECTED NONE DETECTED   Laboratory interpretation all normal except positive UDS, mild stable anemia    EKG EKG Interpretation  Date/Time:  Saturday May 22 2019 22:29:53 EDT Ventricular Rate:  68 PR Interval:    QRS Duration: 98 QT Interval:  386 QTC Calculation: 411  R Axis:   64 Text Interpretation:  Sinus rhythm Low voltage, precordial leads No significant change since last tracing 12 May 2019 Confirmed by Devoria AlbeKnapp, Mikias Lanz ([id number]) on 05/22/2019 11:16:13 PM   Radiology Dg Chest 2 View  Result Date: 05/23/2019 CLINICAL DATA:  Chest pain EXAM: CHEST - 2 VIEW COMPARISON:  05/15/2019 FINDINGS: Heart and mediastinal contours are within normal limits. No focal opacities or effusions. No acute bony abnormality. IMPRESSION: No active cardiopulmonary disease. Electronically Signed   By: Charlett NoseKevin  Dover M.D.   On: 05/23/2019 00:19   Dg Shoulder Left  Result Date: 05/23/2019 CLINICAL DATA:  Left shoulder pain. EXAM: LEFT SHOULDER - 2+ VIEW COMPARISON:  None. FINDINGS: There is no evidence of fracture or dislocation. There is no evidence of arthropathy or other focal bone abnormality. Soft tissues are unremarkable. IMPRESSION: Negative. Electronically Signed   By:  Charlett NoseKevin  Dover M.D.   On: 05/23/2019 00:20    Procedures Procedures (including critical care time)  Medications Ordered in ED Medications - No data to display   Initial Impression / Assessment and Plan / ED Course  I have reviewed the triage vital signs and the nursing notes.  Pertinent labs & imaging results that were available during my care of the patient were reviewed by me and considered in my medical decision making (see chart for details).   After evaluating patient TTS consult was ordered.    4:30 AM Ava, TTS has evaluated patient and recommends inpatient placement.    Final Clinical Impressions(s) / ED Diagnoses   Final diagnoses:  Heroin abuse (HCC)  Depression, unspecified depression type  Suicidal ideation    Disposition inpatient psychiatric admission   Devoria AlbeIva Thurmond Hildebran, MD, Concha PyoFACEP    Ovadia Lopp, MD 05/23/19 [phone]

## 2019-05-23 NOTE — BH Assessment (Addendum)
Tele Assessment Note   Patient Name: Michael ArmsRobert [name] Michael [name] MRN: [id number] Referring Physician:  Location of Patient: ED Location of Provider: ED   Diagnosis: Opiate Abuse and Major Depressive Disorder     Patient is a 38 year old male.  Patient reports SI with a plan to overdose on heroin.  Patient reports that he wants to stop abusing heroin but he does not know how to stop independently.    Patient reports that he was discharged from the hospital on July 26 after a heroin overdose that required intubation to protect his airway.  Patient reports that he was on life support for 3 days.   Patient reports that his last use of heroin was 7 PM tonight and that did make the chest pains.  Patient has been abusing heroin for the past 3 years.   Patient reports denies a history of seizures or DT's.   Patient lives with his mother and he is unemployed.  Patient is single and does not have any children.    Patient denies psychosis and HI.  Patient reports treatment in the past with [facility].    Past Medical History:  Past Medical History:  Diagnosis Date  . Polysubstance abuse (HCC)   . Sciatica   . Shoulder dislocation     Past Surgical History:  Procedure Laterality Date  . ELBOW FRACTURE SURGERY      Family History: No family history on file.  Social History:  reports that he has been smoking cigarettes. He has been smoking about 1.00 pack per day. He has never used smokeless tobacco. He reports current alcohol use. He reports current drug use. Drugs: IV and Benzodiazepines.  Additional Social History:  Alcohol / Drug Use History of alcohol / drug use?: Yes Substance #1 Name of Substance 1: Heroine 1 - Age of First Use: 35 1 - Amount (size/oz): $40 worth 1 - Frequency: Dialy 1 - Duration: 3 years 1 - Last Use / Amount: 2 days ago  CIWA: CIWA-Ar BP: 125/85 Pulse Rate: 67 COWS: Clinical Opiate Withdrawal Scale (COWS) Resting Pulse Rate: Pulse Rate 80 or  below Sweating: No report of chills or flushing Restlessness: Reports difficulty sitting still, but is able to do so Pupil Size: Pupils possibly larger than normal for room light Bone or Joint Aches: Mild diffuse discomfort Runny Nose or Tearing: Nasal stuffiness or unusually moist eyes GI Upset: No GI symptoms Tremor: No tremor Yawning: Yawning once or twice during assessment Anxiety or Irritability: Patient so irritable or anxious that participation in the assessment is difficult Gooseflesh Skin: Skin is smooth COWS Total Score: 9  Allergies: No Known Allergies  Home Medications: (Not in a hospital admission)   OB/GYN Status:  No LMP for male patient.  General Assessment Data Location of Assessment: [facility] TTS Assessment: In system Is this a Tele or Face-to-Face Assessment?: Tele Assessment Is this an Initial Assessment or a Re-assessment for this encounter?: Initial Assessment Patient Accompanied by:: N/A Language Other than English: No Living Arrangements: Other (Comment) What gender do you identify as?: Male Marital status: Single Maiden name: na Pregnancy Status: No Living Arrangements: Parent Can pt return to current living arrangement?: Yes Admission Status: Voluntary Is patient capable of signing voluntary admission?: Yes Referral Source: Self/Family/Friend Insurance type: None Reported     Crisis Care Plan Living Arrangements: Parent Legal Guardian: (na) Name of Psychiatrist: none reported  Name of Therapist: none reported   Education Status Is patient currently in school?: No Is the  patient employed, unemployed or receiving disability?: Unemployed  Risk to self with the past 6 months Suicidal Ideation: Yes-Currently Present Has patient been a risk to self within the past 6 months prior to admission? : Yes Suicidal Intent: Yes-Currently Present Has patient had any suicidal intent within the past 6 months prior to admission? : Yes Is patient at risk for  suicide?: Yes Suicidal Plan?: Yes-Currently Present Has patient had any suicidal plan within the past 6 months prior to admission? : Yes Specify Current Suicidal Plan: plan to overdose  Access to Means: Yes Specify Access to Suicidal Means: friends What has been your use of drugs/alcohol within the last 12 months?: Heroine  Previous Attempts/Gestures: Yes How many times?: 1 Other Self Harm Risks: Heroin abuse  Triggers for Past Attempts: Unpredictable Intentional Self Injurious Behavior: Damaging Comment - Self Injurious Behavior: substance abuse  Family Suicide History: Unknown Recent stressful life event(s): Other (Comment) Persecutory voices/beliefs?: No Depression: Yes Depression Symptoms: Despondent, Insomnia, Tearfulness, Isolating, Fatigue, Loss of interest in usual pleasures, Guilt, Feeling worthless/self pity, Feeling angry/irritable Substance abuse history and/or treatment for substance abuse?: Yes Suicide prevention information given to non-admitted patients: Yes  Risk to Others within the past 6 months Homicidal Ideation: No Does patient have any lifetime risk of violence toward others beyond the six months prior to admission? : No Thoughts of Harm to Others: No Current Homicidal Intent: No Current Homicidal Plan: No Access to Homicidal Means: No Identified Victim: na History of harm to others?: No Assessment of Violence: None Noted Violent Behavior Description: na Does patient have access to weapons?: No Criminal Charges Pending?: No Does patient have a court date: No Is patient on probation?: No  Psychosis Hallucinations: None noted Delusions: None noted  Mental Status Report Appearance/Hygiene: In hospital gown Eye Contact: Fair Motor Activity: Freedom of movement Speech: Soft, Slow Level of Consciousness: Alert Mood: Depressed Affect: Depressed, Sad Anxiety Level: Minimal Thought Processes: Coherent, Relevant Judgement: Unimpaired Orientation:  Person, Place, Time, Situation Obsessive Compulsive Thoughts/Behaviors: None  Cognitive Functioning Concentration: Poor Memory: Recent Intact, Remote Intact Is patient IDD: No Insight: Poor Impulse Control: Poor Appetite: Poor Have you had any weight changes? : No Change Sleep: Decreased Total Hours of Sleep: 4 Vegetative Symptoms: Staying in bed  ADLScreening [facility] Assessment Services) Patient's cognitive ability adequate to safely complete daily activities?: No Patient able to express need for assistance with ADLs?: No Independently performs ADLs?: Yes (appropriate for developmental age)  Prior Inpatient Therapy Prior Inpatient Therapy: Yes Prior Therapy Dates: 04/2018, 03/2018, 2001 Prior Therapy Facilty/Provider(s): [facility], [facility], [facility]  Reason for Treatment: MDD suicidal, SA   Prior Outpatient Therapy Prior Outpatient Therapy: Yes Prior Therapy Dates: 2019 Prior Therapy Facilty/Provider(s): [facility] Reason for Treatment: Heroin  Does patient have an ACCT team?: No Does patient have Intensive In-House Services?  : No Does patient have [facility] services? : No Does patient have P4CC services?: No  ADL Screening (condition at time of admission) Patient's cognitive ability adequate to safely complete daily activities?: No Patient able to express need for assistance with ADLs?: No Independently performs ADLs?: Yes (appropriate for developmental age)       Abuse/Neglect Assessment (Assessment to be complete while patient is alone) Physical Abuse: Denies Verbal Abuse: Denies Sexual Abuse: Denies Exploitation of patient/patient's resources: Denies Self-Neglect: Denies Values / Beliefs Cultural Requests During Hospitalization: None Spiritual Requests During Hospitalization: None Consults Spiritual Care Consult Needed: No Social Work Consult Needed: No Regulatory affairs officer (For Healthcare) Does Patient Have  a Medical Advance Directive?: No Would  patient like information on creating a medical advance directive?: No - Patient declined          Disposition: Per Donell SievertSpencer Simon, PA - patient meets criteria for inpt hospitalization.  [facility]  TTS will refer out to other facilities.    Disposition Initial Assessment Completed for this Encounter: Yes  This service was provided via telemedicine using a 2-way, interactive audio and video technology.  Names of all persons participating in this telemedicine service and their role in this encounter. Name: Clyde CanterburyAva L Sharvil Hoey  Role: MA, LCAS-A             Phillip HealStevenson, Myrl Lazarus LaVerne 05/23/2019 4:02 AM

## 2019-05-23 NOTE — ED Notes (Signed)
Denies chest pain 

## 2019-05-24 NOTE — Discharge Instructions (Addendum)
Follow-up as instructed by behavioral health 

## 2019-05-24 NOTE — ED Notes (Signed)
TTS consult in progress. °

## 2019-05-24 NOTE — BH Assessment (Signed)
Disposition Note: Pt presents lying in bed for reassessment. He expresses frustration waiting in the ED. Pt denies SI, HI, & AVH. He states what he needs most is to get off heroin. Pt agreeable to contacting substance abuse resources listed on FAX sent to [place]. Mordecai Maes, NP also met with pt. Pt again denies SI, HI & AVH.  Mordecai Maes, NP recommends pt be psychiatrically cleared to follow up with substance abuse treatment.  Pt's RN, Theadora Rama, informed by telephone.

## 2019-11-07 IMAGING — CR DG CHEST 1V PORT
1 series · 1 of 1 positions shown · non-contrast
Comparison: 06/09/2008

CLINICAL DATA: Altered mental status.

EXAM:
PORTABLE CHEST 1 VIEW

[ap portable]
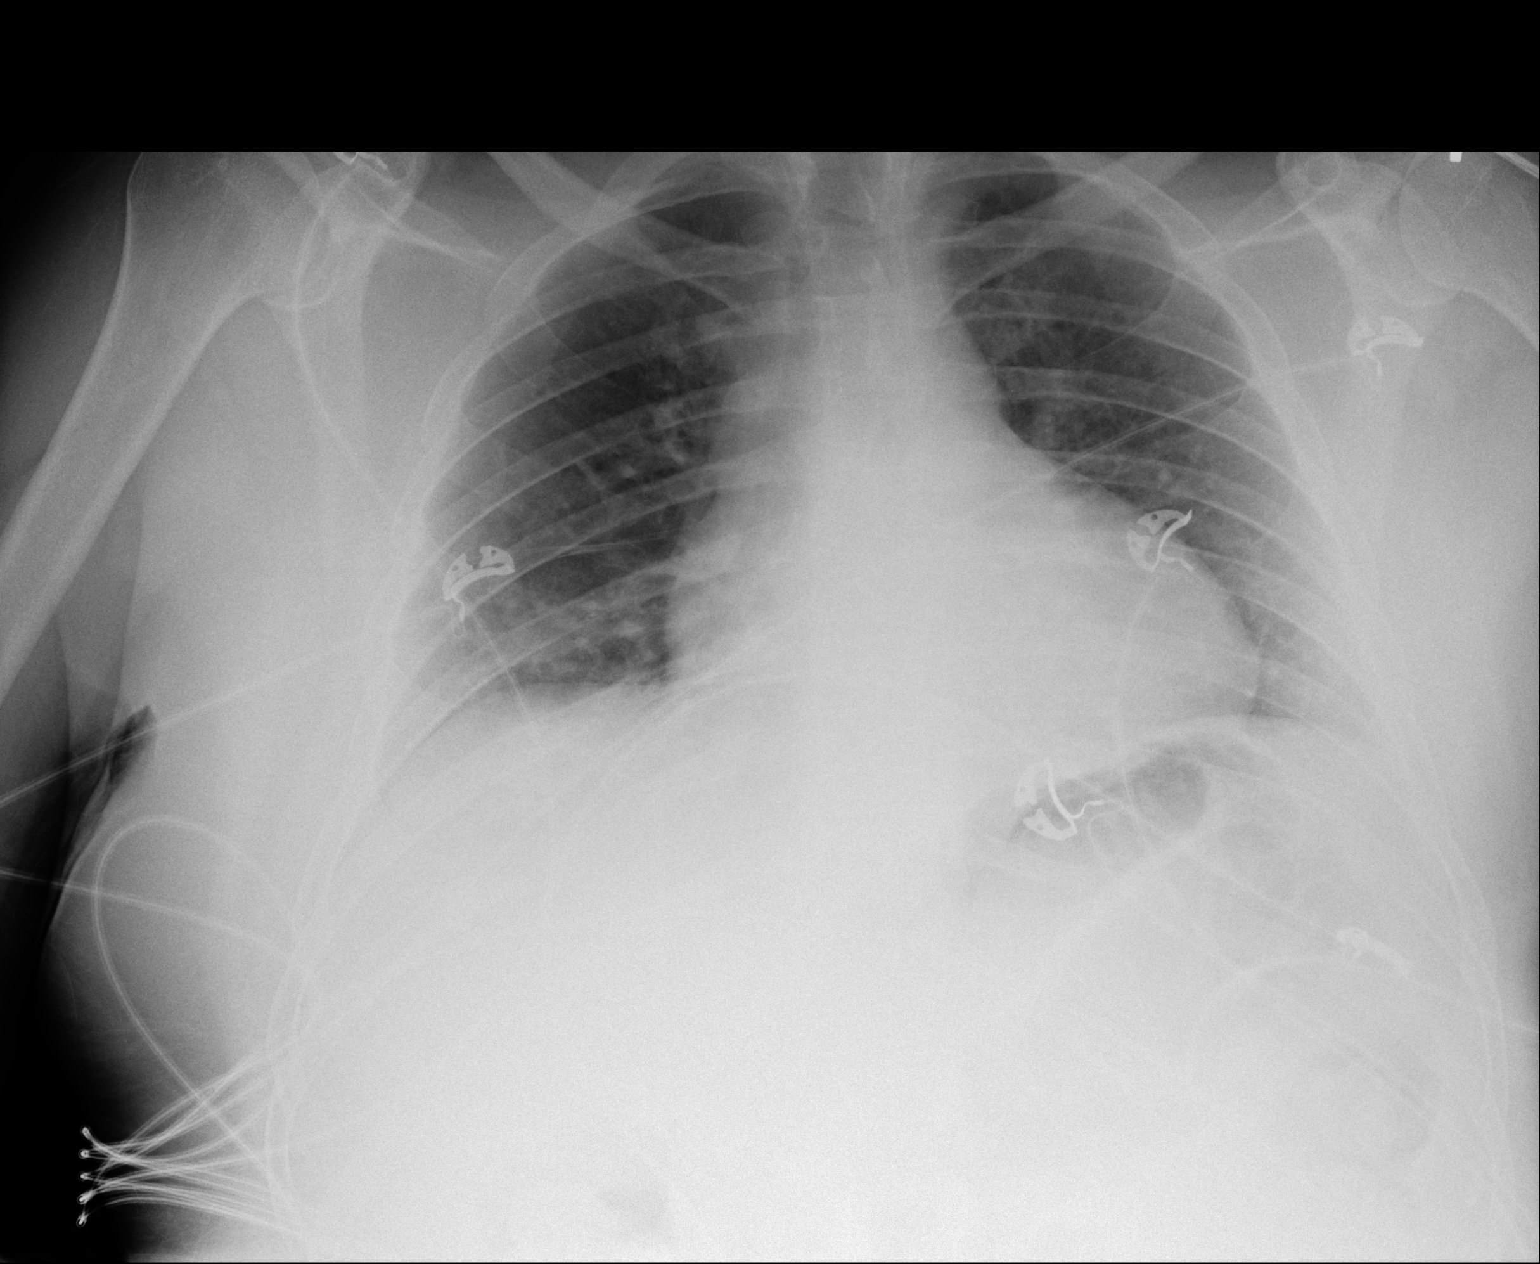

[1 of 1 positions shown; findings below may reference images not displayed]

FINDINGS: The cardiac silhouette appears mildly enlarged, accentuated by
portable AP technique and low lung volumes. Bronchovascular crowding
is noted in the lung bases. No airspace consolidation, overt
pulmonary edema, sizable pleural effusion, or pneumothorax is
identified. No acute osseous abnormality is seen.
IMPRESSION: No active disease.

## 2020-07-16 IMAGING — CR PORTABLE CHEST - 1 VIEW
1 series · 2 of 2 positions shown · non-contrast
Comparison: 09/03/2018

CLINICAL DATA: Intubation. Possible overdose.

EXAM:
PORTABLE CHEST 1 VIEW

[Series 1: portable · 0.17mm/px · 2 of 2 slices shown]
[im 1/2]
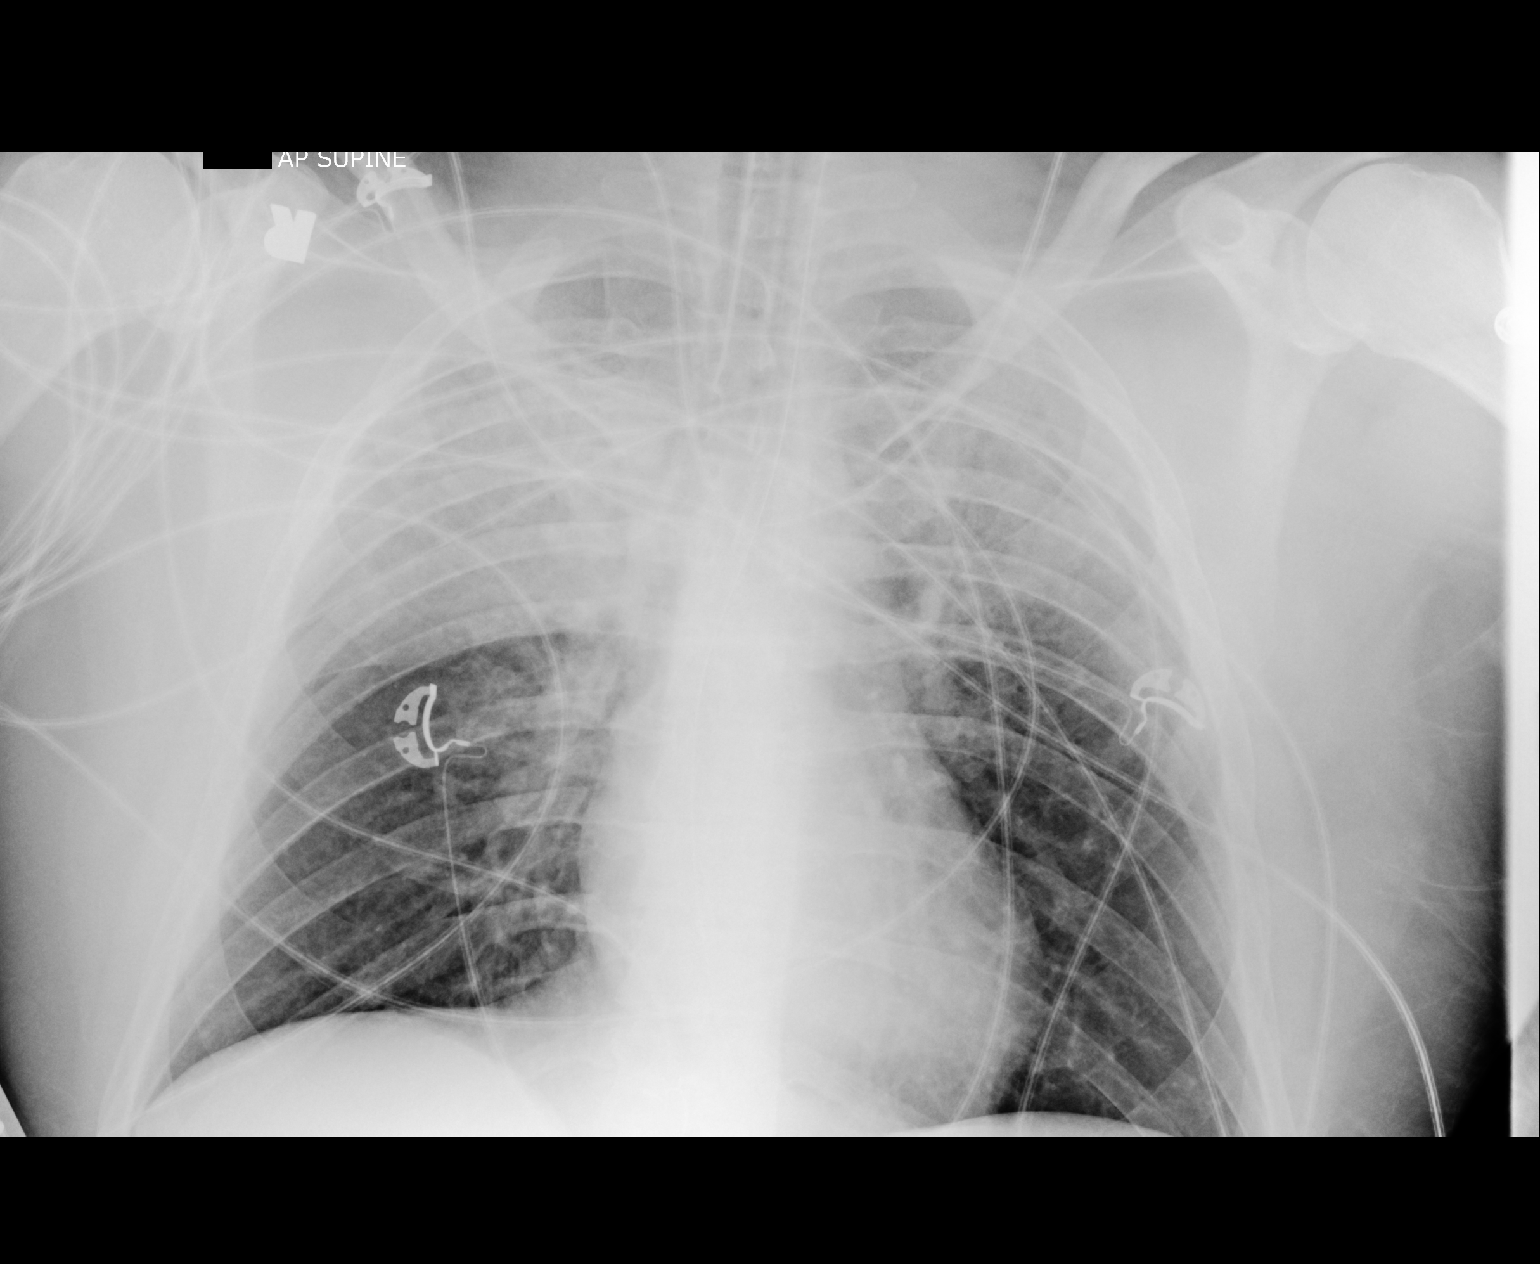
[im 2/2]
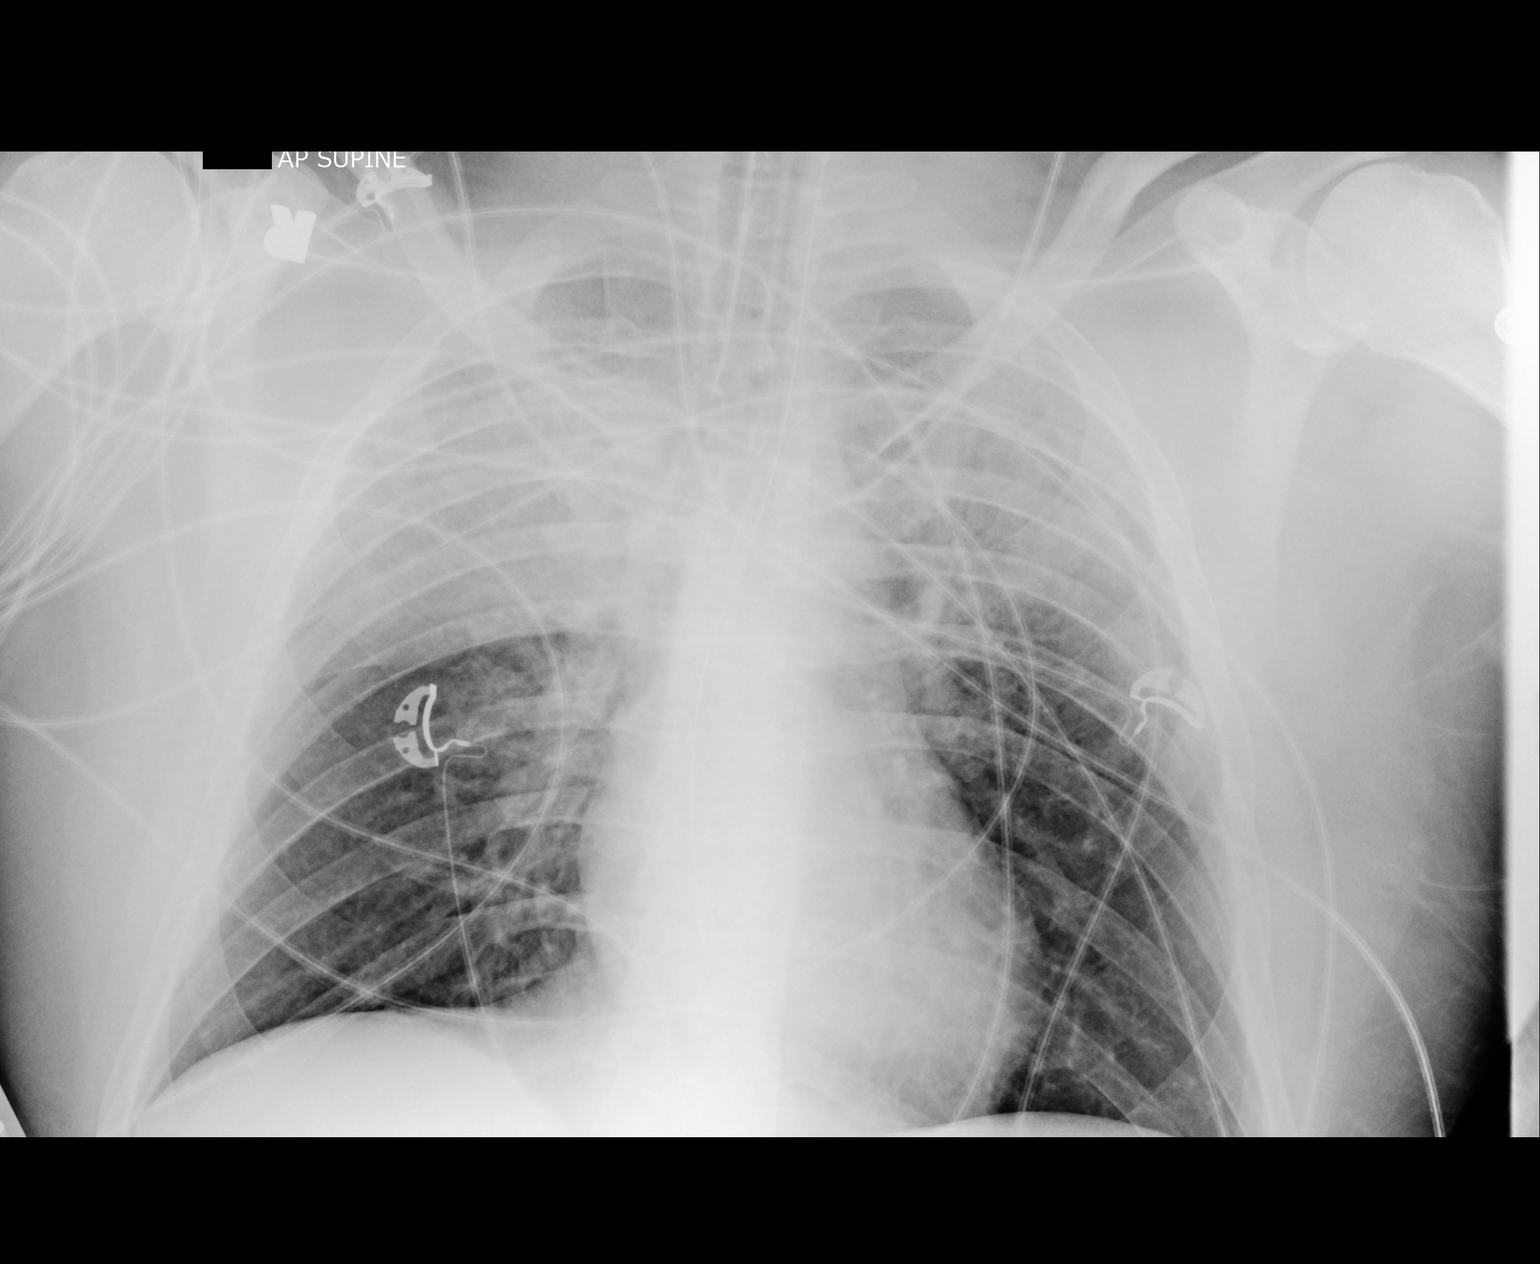

[2 of 2 positions shown; findings below may reference images not displayed]

FINDINGS: Endotracheal tube tip 4.8 cm from the carina. Enteric tube in place
with tip and side-port below the diaphragm in the stomach.
Heterogeneous bilateral suprahilar opacities. Heart is normal in
size. No large pleural effusion or pneumothorax. No acute osseous
abnormalities are seen.
IMPRESSION: 1. Endotracheal tube tip 4.8 cm from the carina. Enteric tube in
place, tip and side-port below the diaphragm.
2. Heterogeneous bilateral suprahilar opacities may be aspiration or
atelectasis. Pulmonary edema felt less likely.

## 2020-07-17 IMAGING — CR PORTABLE CHEST - 1 VIEW
1 series · 2 of 2 positions shown · non-contrast
Comparison: 05/13/2019 and prior exams

CLINICAL DATA: Respiratory failure.

EXAM:
PORTABLE CHEST 1 VIEW

[Series 2: portable · 0.17mm/px · 2 of 2 slices shown]
[im 1/2]
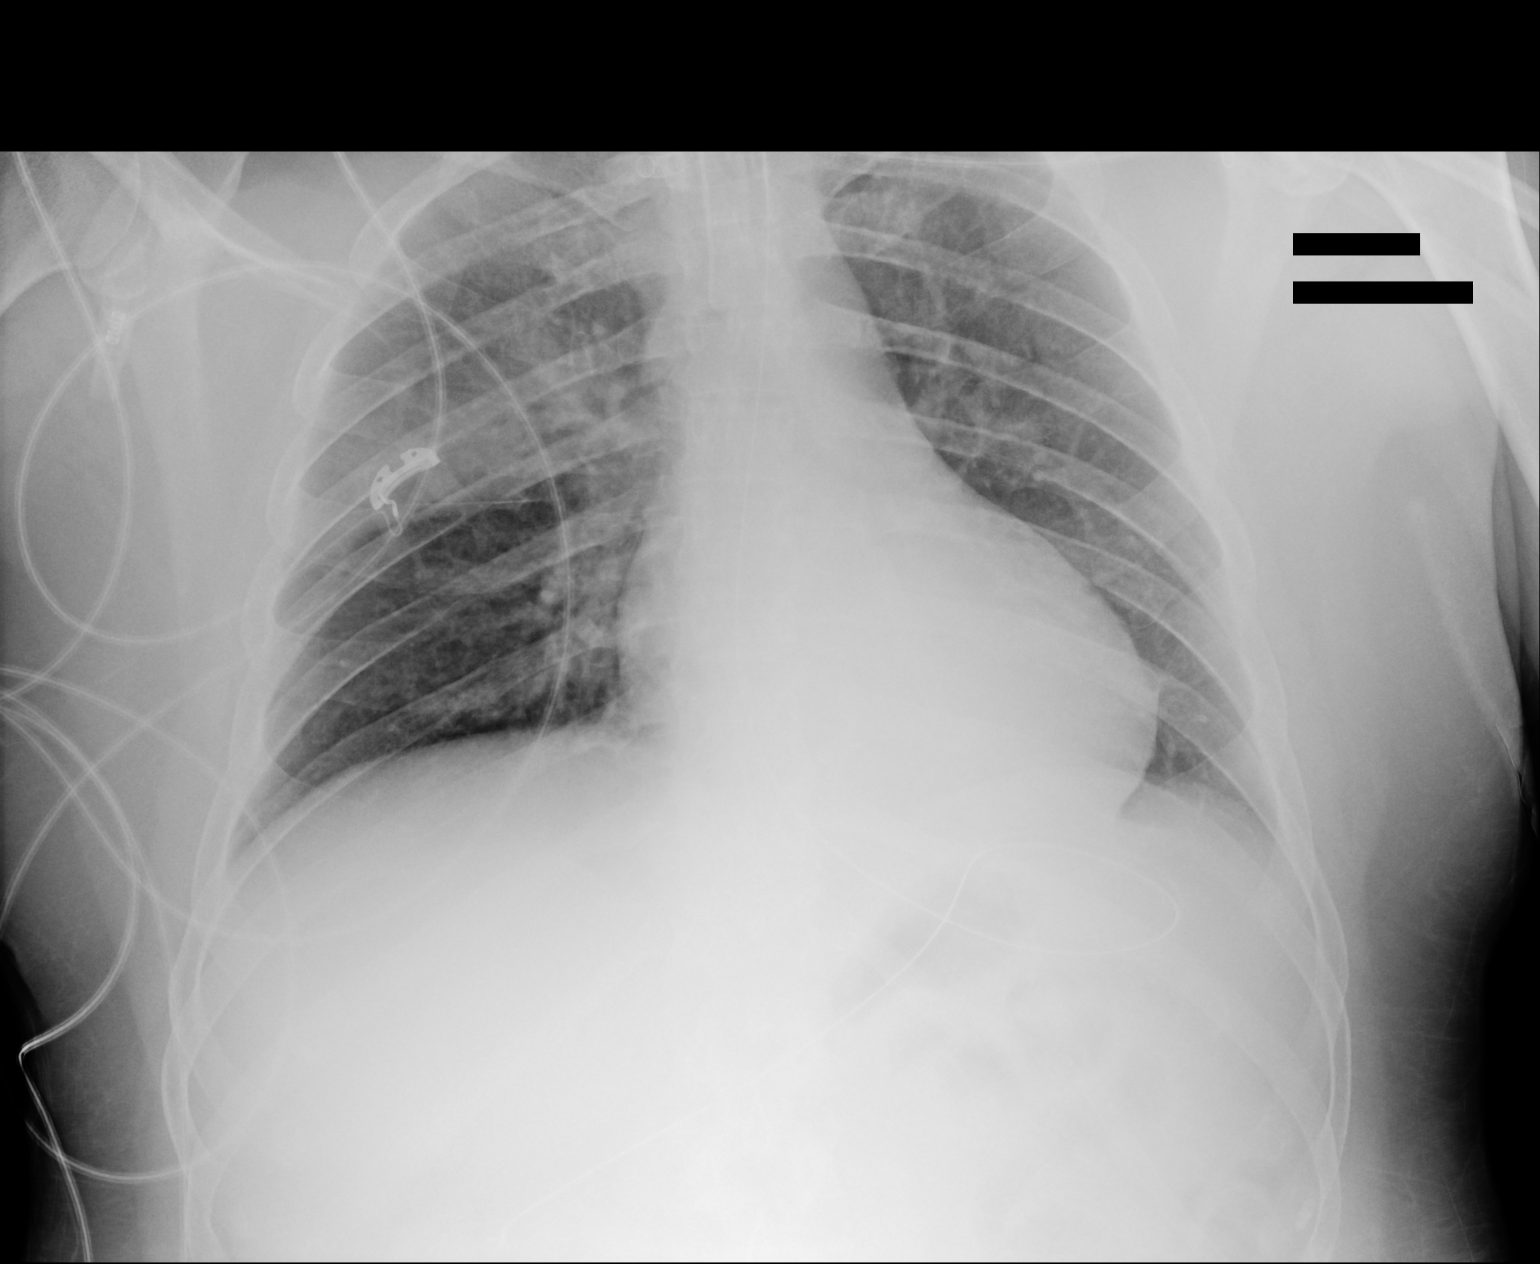
[im 2/2]
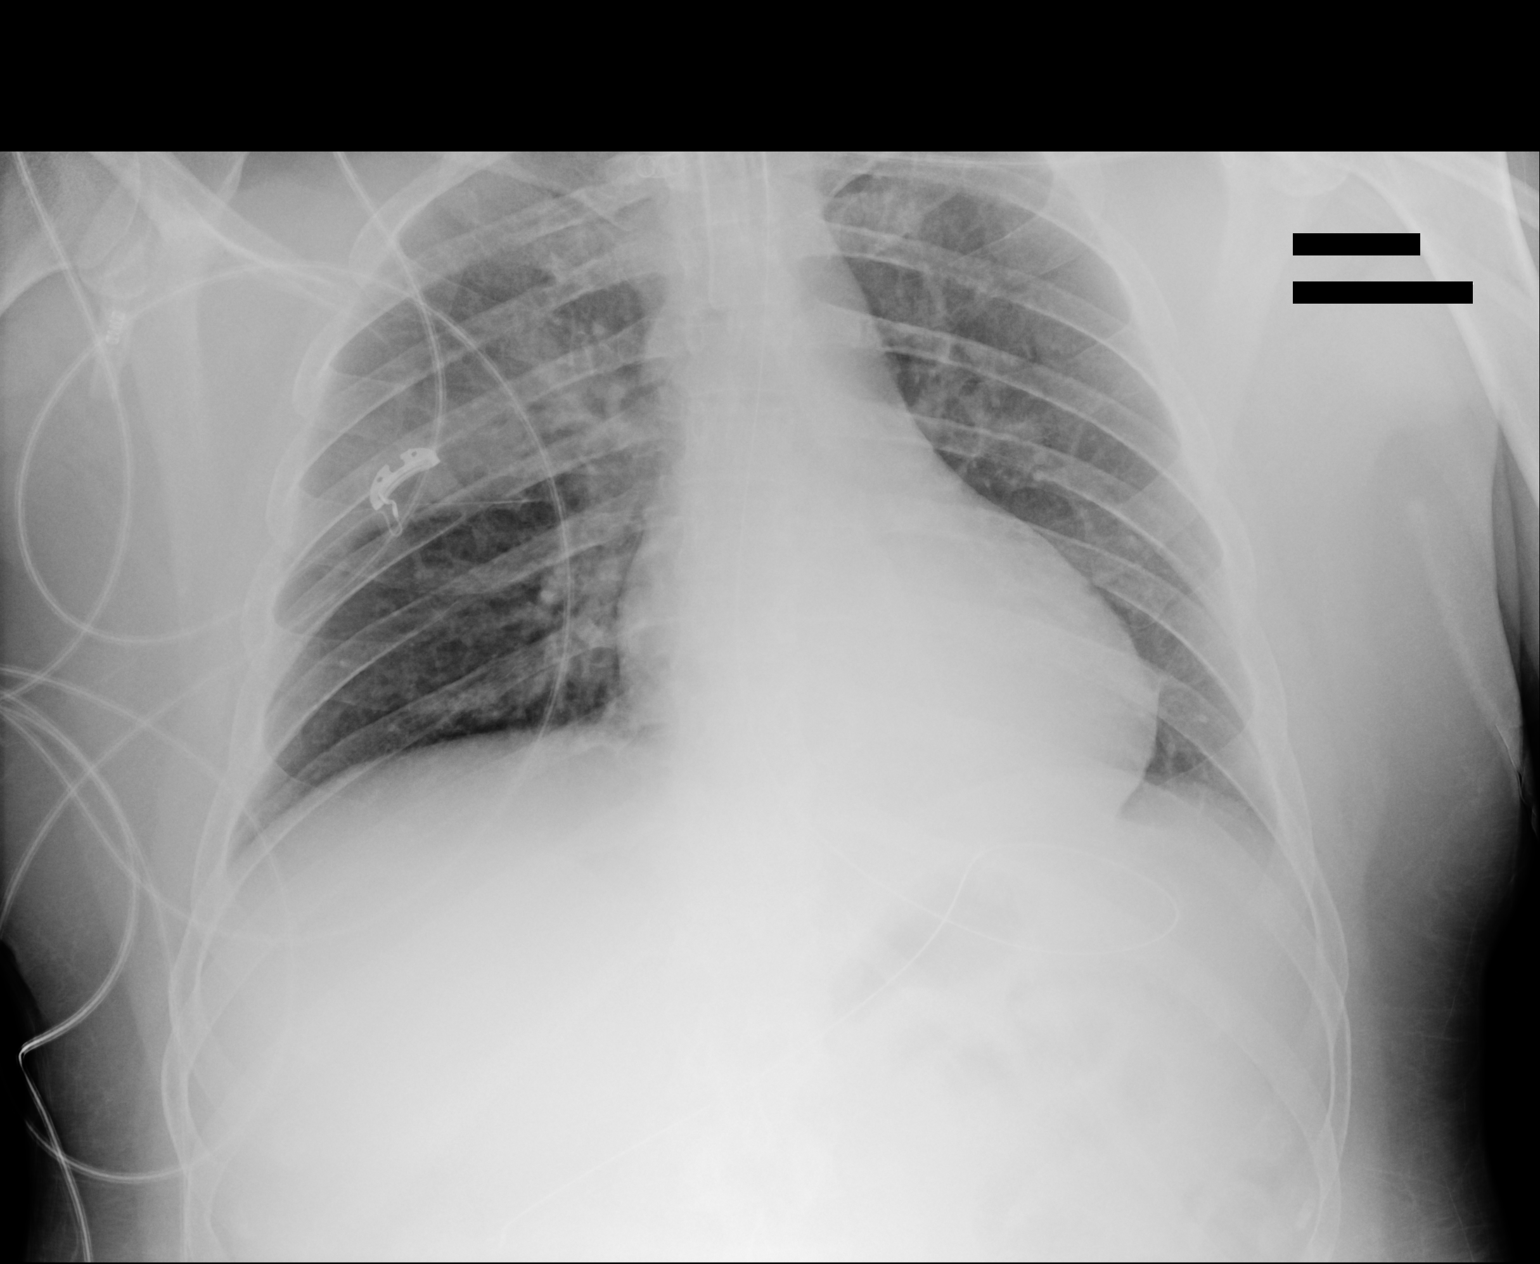

[2 of 2 positions shown; findings below may reference images not displayed]

FINDINGS: Cardiomediastinal silhouette is unchanged.

An endotracheal tube with tip 2.5 cm above the carina and NG tube
within the stomach noted.

Bilateral UPPER lung airspace opacities have decreased.

No pneumothorax.  No other significant change.
IMPRESSION: Decreased bilateral UPPER lung airspace opacities. No other
significant change.

## 2020-07-18 IMAGING — CR PORTABLE CHEST - 1 VIEW
1 series · 1 of 1 positions shown · non-contrast
Comparison: Chest x-ray 05/14/2019.

CLINICAL DATA: 38-year-old male with history of shortness of
breath.

EXAM:
PORTABLE CHEST 1 VIEW

[portable]
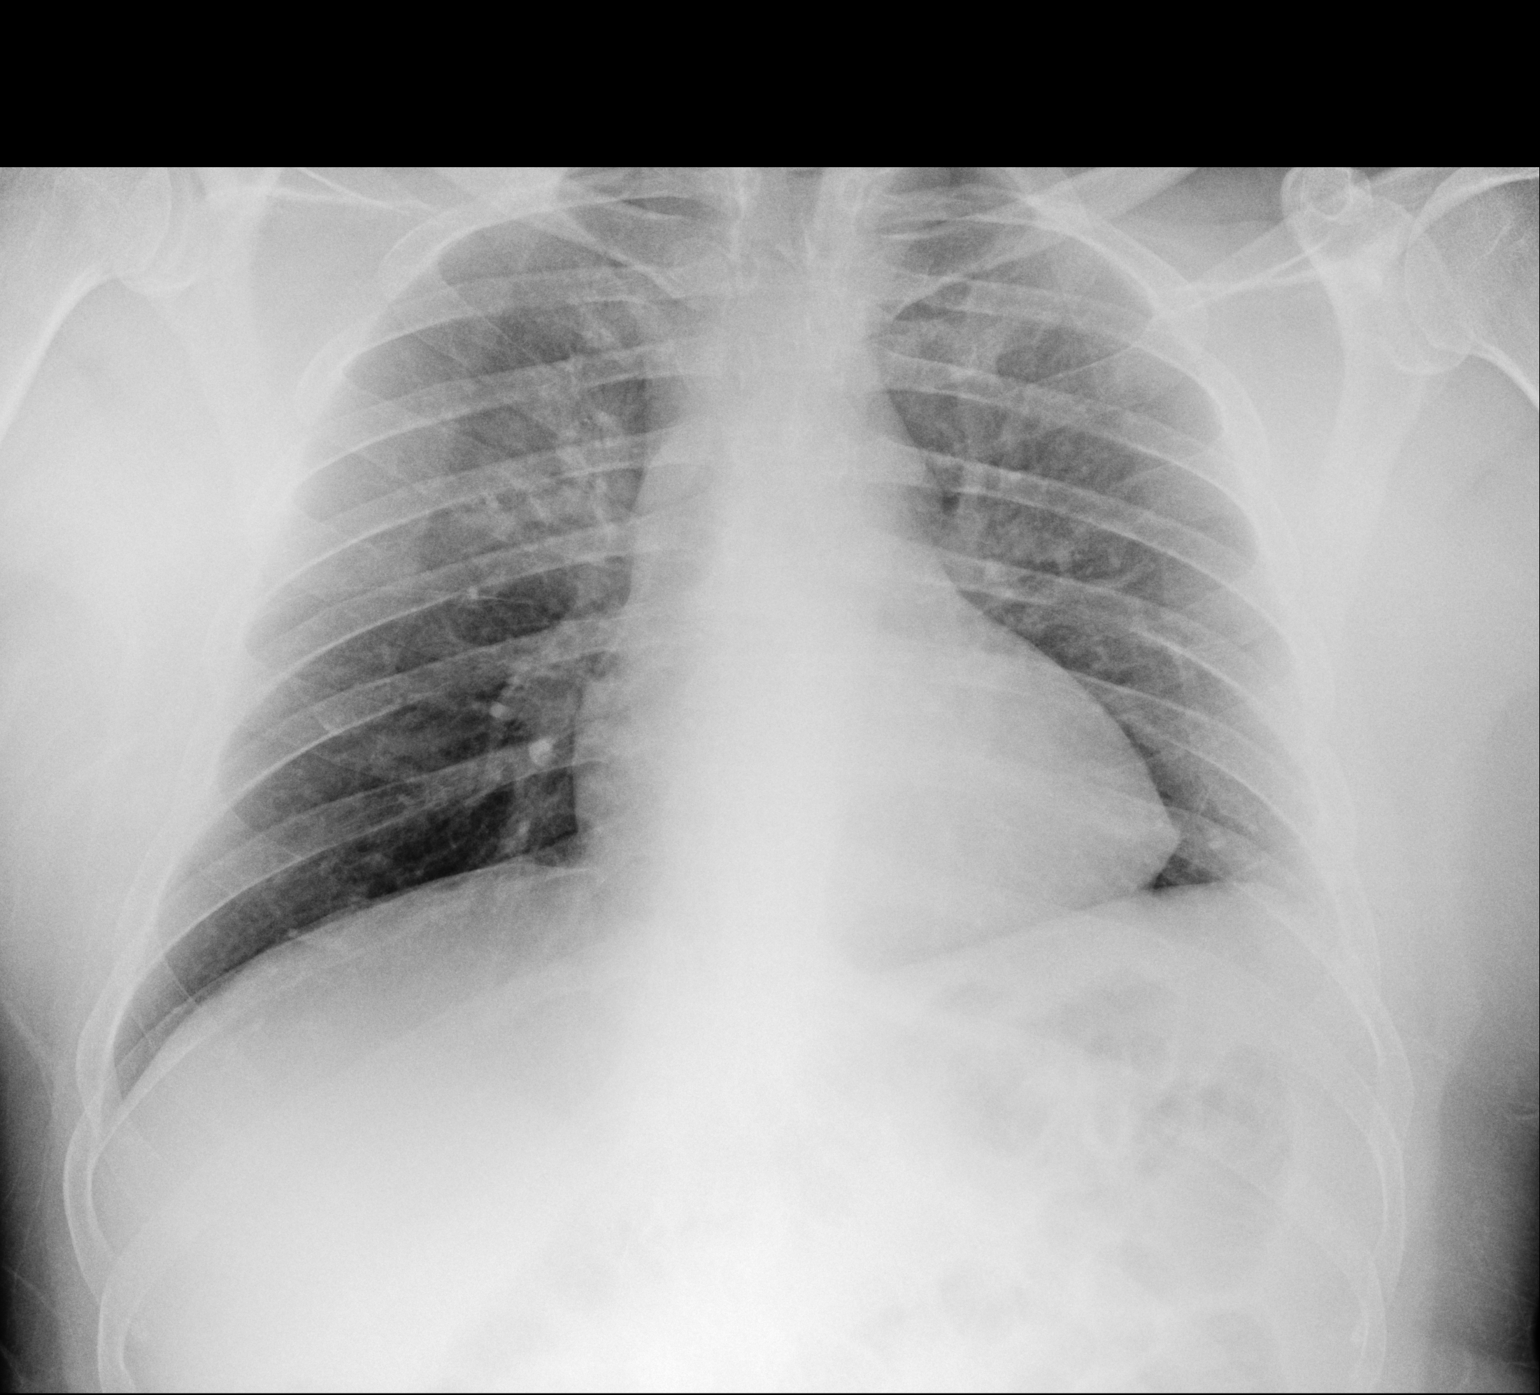

[1 of 1 positions shown; findings below may reference images not displayed]

FINDINGS: Previously noted endotracheal tube and nasogastric tubes have both
been removed. Lung volumes are normal. No consolidative airspace
disease. No pleural effusions. No pneumothorax. No pulmonary nodule
or mass noted. Pulmonary vasculature and the cardiomediastinal
silhouette are within normal limits.
IMPRESSION: No radiographic evidence of acute cardiopulmonary disease.

## 2020-07-25 IMAGING — DX CHEST - 2 VIEW
2 series · 2 of 2 positions shown · non-contrast
Comparison: 05/15/2019

CLINICAL DATA: Chest pain

EXAM:
CHEST - 2 VIEW

[chest pa]
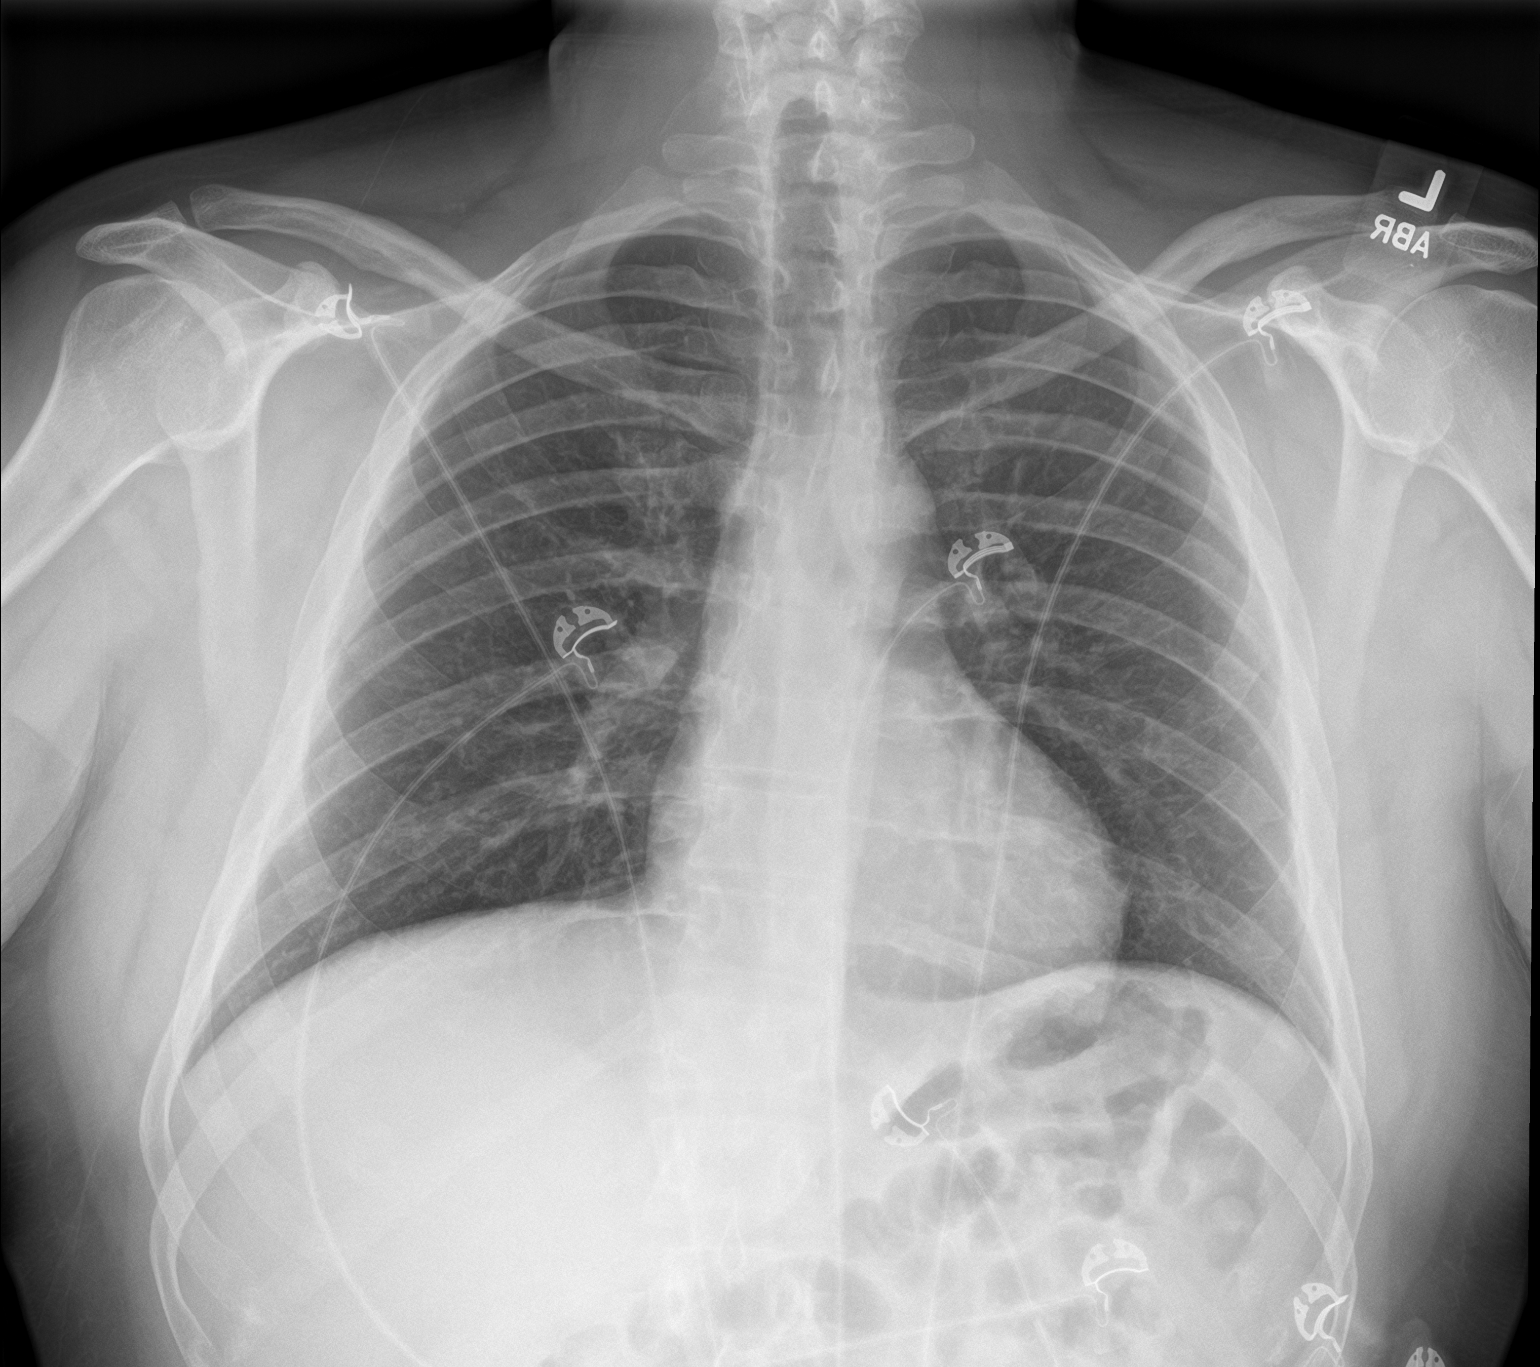

[chest lat]
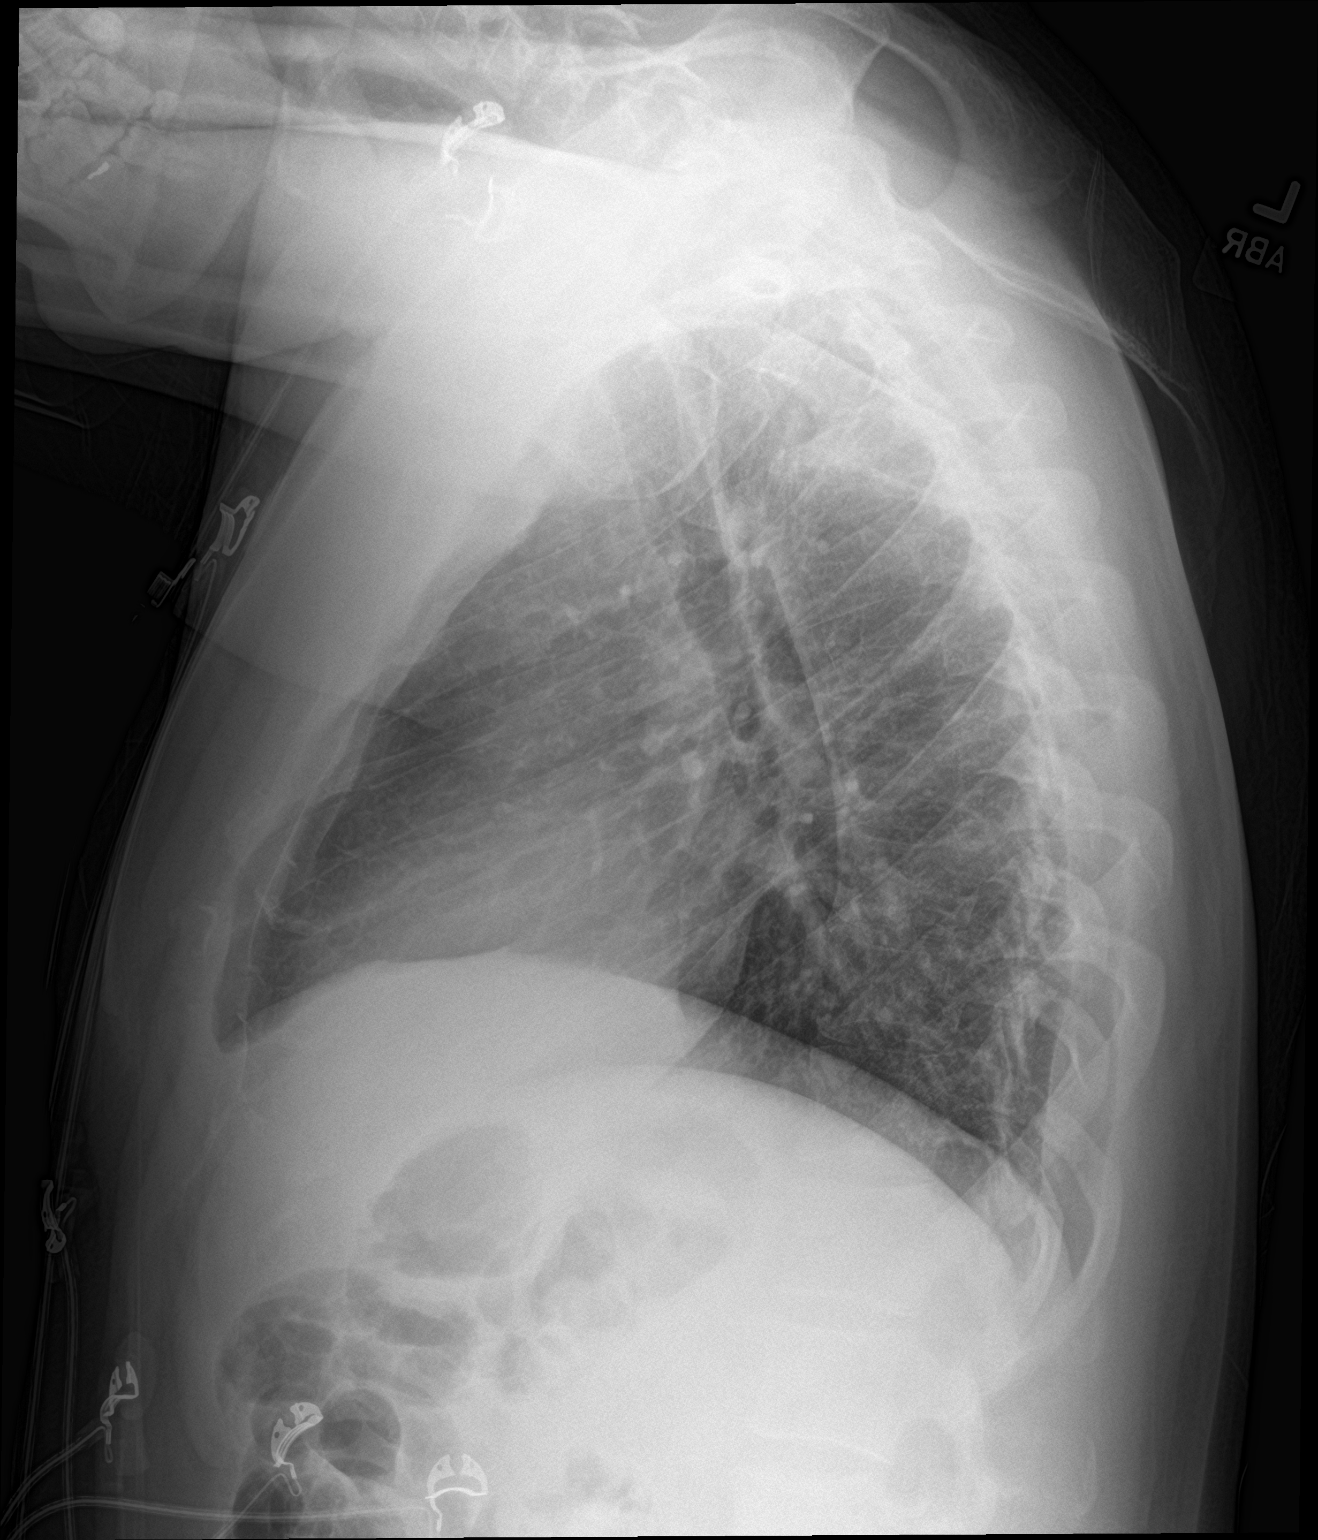

[2 of 2 positions shown; findings below may reference images not displayed]

FINDINGS: Heart and mediastinal contours are within normal limits. No focal
opacities or effusions. No acute bony abnormality.
IMPRESSION: No active cardiopulmonary disease.

## 2021-03-29 ENCOUNTER — Encounter (HOSPITAL_COMMUNITY): Payer: Self-pay | Admitting: Emergency Medicine

## 2021-03-29 ENCOUNTER — Emergency Department (HOSPITAL_COMMUNITY)
Admission: EM | Admit: 2021-03-29 | Discharge: 2021-03-29 | Disposition: A | Discharge status: Home / Self Care | Payer: Self-pay | Attending: Emergency Medicine | Admitting: Emergency Medicine

## 2021-03-29 ENCOUNTER — Other Ambulatory Visit: Payer: Self-pay

## 2021-03-29 DIAGNOSIS — W57XXXA Bitten or stung by nonvenomous insect and other nonvenomous arthropods, initial encounter: Secondary | ICD-10-CM | POA: Insufficient documentation

## 2021-03-29 DIAGNOSIS — I10 Essential (primary) hypertension: Secondary | ICD-10-CM | POA: Insufficient documentation

## 2021-03-29 DIAGNOSIS — S80869A Insect bite (nonvenomous), unspecified lower leg, initial encounter: Secondary | ICD-10-CM | POA: Insufficient documentation

## 2021-03-29 DIAGNOSIS — F17210 Nicotine dependence, cigarettes, uncomplicated: Secondary | ICD-10-CM | POA: Insufficient documentation

## 2021-03-29 DIAGNOSIS — S50869A Insect bite (nonvenomous) of unspecified forearm, initial encounter: Secondary | ICD-10-CM | POA: Insufficient documentation

## 2021-03-29 DIAGNOSIS — Y92007 Garden or yard of unspecified non-institutional (private) residence as the place of occurrence of the external cause: Secondary | ICD-10-CM | POA: Insufficient documentation

## 2021-03-29 MED ORDER — FAMOTIDINE 20 MG PO TABS: 20.0000 mg | ORAL_TABLET | Freq: Every day | ORAL | 0 refills | Status: AC

## 2021-03-29 MED ORDER — DOXYCYCLINE HYCLATE 100 MG PO CAPS: 100.0000 mg | ORAL_CAPSULE | Freq: Two times a day (BID) | ORAL | 0 refills | Status: AC

## 2021-03-29 MED ORDER — LORATADINE 10 MG PO TABS: 10.0000 mg | ORAL_TABLET | Freq: Every day | ORAL | 0 refills | Status: AC

## 2021-03-29 NOTE — Discharge Instructions (Addendum)
Exam looks reassuring.   started you on antibiotics please take as prescribed.  I recommend keeping the wounds clean, rinsing out twice a day and placing new dressings on them.  For itchiness I have given you Claritin as well as Pepcid please take this daily for next 7 days will help with itchiness.  You may also apply hydrocortisone cream which can buy over-the-counter or  take cold baths as this can help decrease itchiness.  Please do not fight the tick and work elsewhere where they are not to be found.  Please follow-up with your PCP for further evaluation.  Come back to the emergency department if you develop chest pain, shortness of breath, severe abdominal pain, uncontrolled nausea, vomiting, diarrhea.

## 2021-03-29 NOTE — ED Notes (Signed)
Pt's mother Britta Mccreedy can be reached at [phone].

## 2021-03-29 NOTE — ED Triage Notes (Signed)
Pt reports multiple tick bites and spraying himself down with flea and tick dog shampoo. Pt scratching self saying he has had 100s of ticks crawling on him. Pt said he sprayed insecticide on yard and then "feels like I may have ingested some of it when I went back out in the yard to work."

## 2021-03-29 NOTE — ED Triage Notes (Signed)
Pt feels like there are ticks crawling in ears.

## 2021-03-29 NOTE — ED Provider Notes (Signed)
[facility] EMERGENCY DEPARTMENT Provider Note   CSN: [id number] Arrival date & time: 03/29/21  1900     History No chief complaint on file.   Michael [name] is a 40 y.o. male.  HPI  Patient with significant medical history of polysubstance abuse, sciatica presents with chief complaint of tick bites.  Patient states yesterday he was out in the yard trying to work out and ticks started to attack him. He went to take a shower immediately and noted that he had ticks all over his body.  he then used his dogs flea and tick shampoo to get the ticks off.  The Following day he woke up  and noted that his skin was itchy and started to scratch his skin. He had multiple bug bites and thinks that the shampoo irritated skin.    He denies ingesting the shampoo or getting it his eyes, he denies tongue or throat swelling, difficulty breathing.  He is unsure of how long the ticks may have been on him, he is not immunocompromise, up-to-date on his tetanus shot.  Patient has not tried anything to help with his itchiness.  Patient denies suicidal ideations, homicidal ideations, hallucinations or delusions.  Patient denies headaches, fevers, chills, shortness of breath or chest pain.  Past Medical History:  Diagnosis Date   Polysubstance abuse [facility])    Sciatica    Shoulder dislocation     Patient Active Problem List   Diagnosis Date Noted   Respiratory failure (HCC)    Pneumonitis    Drug overdose, intentional (HCC) 05/13/2019   Severe recurrent major depression without psychotic features (HCC) 09/06/2018   AMS (altered mental status)    Essential hypertension    Gastroesophageal reflux disease    Opioid dependence with opioid-induced mood disorder (HCC)    Drug overdose 09/03/2018   OCD (obsessive compulsive disorder) 04/13/2018   Major depressive disorder, recurrent severe without psychotic features (HCC) 04/06/2018   Cannabis use disorder, moderate, dependence (HCC) 04/06/2018    Cocaine use disorder, moderate, dependence (HCC) 04/06/2018   Sedative, hypnotic or anxiolytic use disorder, severe, 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 No pertinent family history.  Social History   Tobacco Use   Smoking status: Every Day    Packs/day: 1.00    Pack years: 0.00    Types: Cigarettes   Smokeless tobacco: Never   Tobacco comments:    Patient refused smoking cessation education  Vaping Use   Vaping Use: Never used  Substance Use Topics   Alcohol use: Yes    Comment: "rarely"   Drug use: Yes    Types: IV, Benzodiazepines    Comment: Heroin, Crack yesterday    Home Medications Prior to Admission medications   Medication Sig Start Date End Date Taking? Authorizing Provider  doxycycline (VIBRAMYCIN) 100 MG capsule Take 1 capsule (100 mg total) by mouth 2 (two) times daily for 14 days. 03/29/21 04/12/21 Yes Carroll Sage, PA-C  famotidine (PEPCID) 20 MG tablet Take 1 tablet (20 mg total) by mouth daily for 7 days. 03/29/21 04/05/21 Yes Carroll Sage, PA-C  loratadine (CLARITIN) 10 MG tablet Take 1 tablet (10 mg total) by mouth daily for 7 days. 03/29/21 04/05/21 Yes Carroll Sage, PA-C  Multiple Vitamin (MULTIVITAMIN WITH MINERALS) TABS tablet Take 1 tablet by mouth daily. 05/17/19  Vassie Loll, MD  nicotine (NICODERM CQ - DOSED IN MG/24 HOURS) 21 mg/24hr patch Place 1 patch (21 mg total) onto the skin daily. 05/17/19   Vassie Loll, MD  pantoprazole (PROTONIX) 40 MG tablet Take 1 tablet (40 mg total) by mouth daily. 05/17/19   Vassie Loll, MD    Allergies    Patient has no known allergies.  Review of Systems   Review of Systems  Constitutional:  Negative for chills and fever.  HENT:  Negative for congestion.   Respiratory:  Negative for shortness of breath.   Cardiovascular:  Negative for chest pain.  Gastrointestinal:   Negative for abdominal pain.  Genitourinary:  Negative for enuresis.  Musculoskeletal:  Negative for back pain.  Skin:  Positive for rash.  Neurological:  Negative for headaches.  Hematological:  Does not bruise/bleed easily.   Physical Exam Updated Vital Signs BP 133/79   Pulse 76   Temp 98.2 F (36.8 C) (Oral)   Resp 16   Ht 5\' 6"  (1.676 m)   Wt 69.9 kg   SpO2 100%   BMI 24.86 kg/m   Physical Exam Vitals and nursing note reviewed.  Constitutional:      General: He is not in acute distress.    Appearance: He is not ill-appearing.  HENT:     Head: Normocephalic and atraumatic.     Right Ear: Tympanic membrane, ear canal and external ear normal.     Left Ear: Tympanic membrane, ear canal and external ear normal.     Nose: No congestion.     Mouth/Throat:     Mouth: Mucous membranes are moist.     Pharynx: Oropharynx is clear. No oropharyngeal exudate or posterior oropharyngeal erythema.     Comments: Oropharynx was visualized tongue and uvula are both midline, controlling oral secretions. Eyes:     Extraocular Movements: Extraocular movements intact.     Conjunctiva/sclera: Conjunctivae normal.     Pupils: Pupils are equal, round, and reactive to light.  Cardiovascular:     Rate and Rhythm: Normal rate and regular rhythm.     Pulses: Normal pulses.     Heart sounds: No murmur heard.   No friction rub. No gallop.  Pulmonary:     Effort: No respiratory distress.     Breath sounds: No wheezing, rhonchi or rales.  Abdominal:     Palpations: Abdomen is soft.     Tenderness: There is no abdominal tenderness.  Skin:    General: Skin is warm and dry.     Comments: Patient multiple papules looking like bug bites on his legs and arms, he also has multiple abrasions over them which appears that he has been scratching at them.  There is no drainage or discharge present, they are not warm to the touch, no fluctuant indurations present.  There is no noted rash on his hands or  feet.  Neurological:     Mental Status: He is alert and oriented to person, place, and time.     GCS: GCS eye subscore is 4. GCS verbal subscore is 5. GCS motor subscore is 6.     Comments: Cranial nerves II through XII are grossly intact  Patient have notably word finding.  Psychiatric:        Mood and Affect: Mood normal.    ED Results / Procedures / Treatments   Labs (all labs ordered are listed, but only abnormal results are displayed) Labs Reviewed - No data to display  EKG None  Radiology No results found.  Procedures Procedures   Medications Ordered in ED Medications - No data to display  ED Course  I have reviewed the triage vital signs and the nursing notes.  Pertinent labs & imaging results that were available during my care of the patient were reviewed by me and considered in my medical decision making (see chart for details).    MDM Rules/Calculators/A&P                         Initial impression-patient presents with bug bites.  He is alert, does not appear in acute stress, initial vital signs show tachycardia.  Work-up-due to well-appearing patient, benign physical exam, further lab or imaging ordered at this time.  Reassessment-patient was reassessed, vital signs have improved, patient recommends this time.  Patient agreeable for discharge.  Consult-due to possible rash on body from flea and tick shampoo spoke with poison control who reports they cannot advise very much as we do not know what shampoo was use.  She admits most  times they just need symptomatic care and since this happened yesterday there is less concern for an acute reaction  Rule out-low suspicion for Trudie Buckler or TEN as there is no skin sloughing, no oral lesions present in the mouth.  Low suspicion for Lyme disease or [place] spotted fever as there is no facial deficits, there is no systemic rash, no rashes on the soles or palms of the feet. low suspicion for anaphylactic as vital  signs reassuring, no systemic rash, patient denies GI symptoms.  Low suspicion for psychiatric emergency as patient is alert and oriented, denies suicidal or homicidal ideations, denies hallucinations or delusions.  Plan-  Tick bites-due to uncertainty of how long tick bites were attached to host, will start patient on doxycycline for treatment possible tickborne disease, also cover him for possible infection from bug bites.  Will recommend basic wound care.  For itchiness of the skin will have him take H1 and H2 blockers, use over-the-counter hydrocortisone cream.  Plan follow-up with PCP for further evaluation.  Vital signs have remained stable, no indication for hospital admission.  Patient discussed with attending and they agreed with assessment and plan.  Patient given at home care as well strict return precautions.  Patient verbalized that they understood agreed to said plan.  Final Clinical Impression(s) / ED Diagnoses Final diagnoses:  Tick bite, unspecified site, initial encounter    Rx / DC Orders ED Discharge Orders          Ordered    doxycycline (VIBRAMYCIN) 100 MG capsule  2 times daily        03/29/21 2011    loratadine (CLARITIN) 10 MG tablet  Daily        03/29/21 2011    famotidine (PEPCID) 20 MG tablet  Daily        03/29/21 2011             Barnie Del 03/29/21 2015    Eber Hong, MD 03/31/21 1456

## 2021-03-30 ENCOUNTER — Emergency Department (HOSPITAL_COMMUNITY)
Admission: EM | Admit: 2021-03-30 | Discharge: 2021-03-30 | Disposition: A | Discharge status: Home / Self Care | Payer: Medicaid Other | Attending: Emergency Medicine | Admitting: Emergency Medicine

## 2021-03-30 ENCOUNTER — Encounter (HOSPITAL_COMMUNITY): Payer: Self-pay | Admitting: Emergency Medicine

## 2021-03-30 DIAGNOSIS — S30861A Insect bite (nonvenomous) of abdominal wall, initial encounter: Secondary | ICD-10-CM | POA: Insufficient documentation

## 2021-03-30 DIAGNOSIS — S0006XA Insect bite (nonvenomous) of scalp, initial encounter: Secondary | ICD-10-CM | POA: Insufficient documentation

## 2021-03-30 DIAGNOSIS — L989 Disorder of the skin and subcutaneous tissue, unspecified: Secondary | ICD-10-CM

## 2021-03-30 DIAGNOSIS — W57XXXA Bitten or stung by nonvenomous insect and other nonvenomous arthropods, initial encounter: Secondary | ICD-10-CM | POA: Insufficient documentation

## 2021-03-30 DIAGNOSIS — S1096XA Insect bite of unspecified part of neck, initial encounter: Secondary | ICD-10-CM | POA: Insufficient documentation

## 2021-03-30 DIAGNOSIS — S80869A Insect bite (nonvenomous), unspecified lower leg, initial encounter: Secondary | ICD-10-CM | POA: Insufficient documentation

## 2021-03-30 DIAGNOSIS — S50869A Insect bite (nonvenomous) of unspecified forearm, initial encounter: Secondary | ICD-10-CM | POA: Insufficient documentation

## 2021-03-30 DIAGNOSIS — F17210 Nicotine dependence, cigarettes, uncomplicated: Secondary | ICD-10-CM | POA: Insufficient documentation

## 2021-03-30 MED ORDER — HYDROXYZINE HCL 25 MG PO TABS: 25.0000 mg | ORAL_TABLET | Freq: Four times a day (QID) | ORAL | 0 refills | Status: AC

## 2021-03-30 NOTE — ED Provider Notes (Signed)
[facility]  HOSPITAL-EMERGENCY DEPT Provider Note   CSN: [id number] Arrival date & time: 03/30/21  1418     History Chief Complaint  Patient presents with   Insect Bite    Michael [name] is a 40 y.o. male.  The history is provided by the patient.      Michael [name] is a 40 y.o. male, with a history of polysubstance abuse, presenting to the ED with itching to the skin as well as formation of skin lesions over the past couple days.  States he thinks he has been bitten by ticks who have attacked him en mass as well as "a tiny bug, may be small spiders, chiggers, bedbugs, or lice.  They climb up my legs and are now in my hair."  Patient was seen in the ED at [facility] yesterday, but has not yet filled his prescriptions. Denies substance abuse. Denies fever, pain, lymphadenopathy, shortness of breath, cough, or any other complaints.  Past Medical History:  Diagnosis Date   Polysubstance abuse [facility])    Sciatica    Shoulder dislocation     Patient Active Problem List   Diagnosis Date Noted   Respiratory failure (HCC)    Pneumonitis    Drug overdose, intentional (HCC) 05/13/2019   Severe recurrent major depression without psychotic features (HCC) 09/06/2018   AMS (altered mental status)    Essential hypertension    Gastroesophageal reflux disease    Opioid dependence with opioid-induced mood disorder (HCC)    Drug overdose 09/03/2018   OCD (obsessive compulsive disorder) 04/13/2018   Major depressive disorder, recurrent severe without psychotic features (HCC) 04/06/2018   Cannabis use disorder, moderate, dependence (HCC) 04/06/2018   Cocaine use disorder, moderate, dependence (HCC) 04/06/2018   Sedative, hypnotic or anxiolytic use disorder, severe, 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  Social  History   Tobacco Use   Smoking status: Every Day    Packs/day: 1.00    Pack years: 0.00    Types: Cigarettes   Smokeless tobacco: Never   Tobacco comments:    Patient refused smoking cessation education  Vaping Use   Vaping Use: Never used  Substance Use Topics   Alcohol use: Yes    Comment: "rarely"   Drug use: Yes    Types: IV, Benzodiazepines    Comment: Heroin, Crack yesterday    Home Medications Prior to Admission medications   Medication Sig Start Date End Date Taking? Authorizing Provider  hydrOXYzine (ATARAX/VISTARIL) 25 MG tablet Take 1 tablet (25 mg total) by mouth every 6 (six) hours. 03/30/21  Yes Brodan Grewell C, PA-C  doxycycline (VIBRAMYCIN) 100 MG capsule Take 1 capsule (100 mg total) by mouth 2 (two) times daily for 14 days. 03/29/21 04/12/21  Carroll Sage, PA-C  famotidine (PEPCID) 20 MG tablet Take 1 tablet (20 mg total) by mouth daily for 7 days. 03/29/21 04/05/21  Carroll Sage, PA-C  loratadine (CLARITIN) 10 MG tablet Take 1 tablet (10 mg total) by mouth daily for 7 days. 03/29/21 04/05/21  Carroll Sage, PA-C  Multiple Vitamin (MULTIVITAMIN WITH MINERALS) TABS tablet Take 1 tablet by mouth daily. 05/17/19   Vassie Loll, MD  nicotine (NICODERM CQ - DOSED IN MG/24 HOURS) 21 mg/24hr patch Place 1 patch (21 mg total) onto the skin daily. 05/17/19  Vassie Loll, MD  pantoprazole (PROTONIX) 40 MG tablet Take 1 tablet (40 mg total) by mouth daily. 05/17/19   Vassie Loll, MD    Allergies    Patient has no known allergies.  Review of Systems   Review of Systems  Constitutional:  Negative for fever.  Respiratory:  Negative for shortness of breath.   Cardiovascular:  Negative for chest pain.  Gastrointestinal:  Negative for nausea and vomiting.  Skin:        Skin lesions and itching  Neurological:  Negative for weakness, numbness and headaches.  All other systems reviewed and are negative.  Physical Exam Updated Vital Signs BP (!) 123/105    Pulse (!) 101   Temp 99.4 F (37.4 C) (Oral)   Resp 16   Ht 5\' 6"  (1.676 m)   Wt 69.9 kg   SpO2 97%   BMI 24.86 kg/m   Physical Exam Vitals and nursing note reviewed.  Constitutional:      General: He is not in acute distress.    Appearance: He is well-developed. He is not diaphoretic.     Comments: Patient is restless and has difficulty sitting still.  HENT:     Head: Normocephalic and atraumatic.     Right Ear: Tympanic membrane, ear canal and external ear normal.     Left Ear: Tympanic membrane, ear canal and external ear normal.     Mouth/Throat:     Mouth: Mucous membranes are moist.     Pharynx: Oropharynx is clear.  Eyes:     Conjunctiva/sclera: Conjunctivae normal.  Cardiovascular:     Rate and Rhythm: Normal rate and regular rhythm.     Pulses: Normal pulses.          Radial pulses are 2+ on the right side and 2+ on the left side.       Posterior tibial pulses are 2+ on the right side and 2+ on the left side.  Pulmonary:     Effort: Pulmonary effort is normal. No respiratory distress.     Breath sounds: Normal breath sounds.  Abdominal:     Palpations: Abdomen is soft.     Tenderness: There is no guarding.  Musculoskeletal:     Cervical back: Neck supple.     Right lower leg: No edema.     Left lower leg: No edema.  Skin:    General: Skin is warm and dry.     Comments: Skin lesions to the legs, trunk, arms, scalp, and neck.  Scabbing over top of these wounds.  Excoriations also noted.  No pustules or vesicles noted.  No insects were noted anywhere on the patient's body.  Neurological:     Mental Status: He is alert and oriented to person, place, and time.  Psychiatric:        Mood and Affect: Mood and affect normal.        Speech: Speech normal.        Behavior: Behavior normal.            ED Results / Procedures / Treatments   Labs (all labs ordered are listed, but only abnormal results are displayed) Labs Reviewed - No data to  display  EKG None  Radiology No results found.  Procedures Procedures   Medications Ordered in ED Medications - No data to display  ED Course  I have reviewed the triage vital signs and the nursing notes.  Pertinent labs & imaging results that were available during my care  of the patient were reviewed by me and considered in my medical decision making (see chart for details).    MDM Rules/Calculators/A&P                          Patient presents with purulence and skin lesions he states are from insect bites. He was already prescribed doxycycline, but has not yet filled it. He will need follow-up through a PCP or dermatologist.  Findings and plan of care discussed with attending physician, Arby Barrette, MD. Dr. Donnald Garre personally evaluated and examined this patient.  Final Clinical Impression(s) / ED Diagnoses Final diagnoses:  Skin lesions    Rx / DC Orders ED Discharge Orders          Ordered    hydrOXYzine (ATARAX/VISTARIL) 25 MG tablet  Every 6 hours        03/30/21 1706             Anselm Pancoast, PA-C 03/30/21 1706    Arby Barrette, MD 04/03/21 2131

## 2021-03-30 NOTE — ED Triage Notes (Signed)
Patient reports being bit by ticks. He was recently seen by a MD and was given an antibiotic. He has not been able to take the medication for lyme disease. He reports the symptoms have worsened.

## 2021-03-30 NOTE — ED Notes (Signed)
This RN reviewed all d/c instructions with patient. Pt verbalized understanding and denies any further questions at this time. No further care needed. Pt unable to sign due to connectivity issues with signature pad

## 2021-03-30 NOTE — Discharge Instructions (Addendum)
  Please take all of your antibiotics until finished!   You may develop abdominal discomfort or diarrhea from the antibiotic.  You may help offset this with probiotics which you can buy or get in yogurt. Do not eat or take the probiotics until 2 hours after your antibiotic.   Hydroxyzine: May use the hydroxyzine for itching.  Use caution as hydroxyzine can make you drowsy.  Try to avoid itching the areas.  Follow-up with a primary care provider or dermatologist on this matter.
